# Patient Record
Sex: Female | Born: 1935
Health system: Southern US, Community
[De-identification: ages and names within clinical notes are randomized; demographics above are authoritative.]

## PROBLEM LIST (undated history)

## (undated) DIAGNOSIS — I219 Acute myocardial infarction, unspecified: Secondary | ICD-10-CM

## (undated) DIAGNOSIS — Z86718 Personal history of other venous thrombosis and embolism: Secondary | ICD-10-CM

## (undated) DIAGNOSIS — E785 Hyperlipidemia, unspecified: Secondary | ICD-10-CM

## (undated) DIAGNOSIS — I10 Essential (primary) hypertension: Secondary | ICD-10-CM

## (undated) DIAGNOSIS — I2510 Atherosclerotic heart disease of native coronary artery without angina pectoris: Secondary | ICD-10-CM

## (undated) DIAGNOSIS — I5181 Takotsubo syndrome: Secondary | ICD-10-CM

## (undated) HISTORY — PX: FRACTURE SURGERY: SHX138

## (undated) HISTORY — PX: TONSILLECTOMY: SUR1361

## (undated) HISTORY — DX: Personal history of other venous thrombosis and embolism: Z86.718

## (undated) HISTORY — PX: APPENDECTOMY: SHX54

## (undated) HISTORY — DX: Takotsubo syndrome: I51.81

## (undated) HISTORY — DX: Atherosclerotic heart disease of native coronary artery without angina pectoris: I25.10

---

## 1998-04-06 ENCOUNTER — Ambulatory Visit (HOSPITAL_COMMUNITY): Admission: RE | Admit: 1998-04-06 | Discharge: 1998-04-06 | Payer: Self-pay | Admitting: Obstetrics & Gynecology

## 1999-10-04 ENCOUNTER — Emergency Department (HOSPITAL_COMMUNITY): Admission: EM | Admit: 1999-10-04 | Discharge: 1999-10-04 | Payer: Self-pay | Admitting: Emergency Medicine

## 2005-06-10 DIAGNOSIS — I5181 Takotsubo syndrome: Secondary | ICD-10-CM

## 2005-06-10 DIAGNOSIS — I2510 Atherosclerotic heart disease of native coronary artery without angina pectoris: Secondary | ICD-10-CM

## 2005-06-10 DIAGNOSIS — I219 Acute myocardial infarction, unspecified: Secondary | ICD-10-CM

## 2005-06-10 HISTORY — DX: Atherosclerotic heart disease of native coronary artery without angina pectoris: I25.10

## 2005-06-10 HISTORY — DX: Takotsubo syndrome: I51.81

## 2005-06-10 HISTORY — DX: Acute myocardial infarction, unspecified: I21.9

## 2006-01-03 HISTORY — PX: CARDIAC CATHETERIZATION: SHX172

## 2006-01-05 ENCOUNTER — Inpatient Hospital Stay (HOSPITAL_COMMUNITY): Admission: EM | Admit: 2006-01-05 | Discharge: 2006-01-12 | Payer: Self-pay | Admitting: Emergency Medicine

## 2006-01-08 ENCOUNTER — Encounter (INDEPENDENT_AMBULATORY_CARE_PROVIDER_SITE_OTHER): Payer: Self-pay | Admitting: Cardiology

## 2006-01-30 ENCOUNTER — Encounter (HOSPITAL_COMMUNITY): Admission: RE | Admit: 2006-01-30 | Discharge: 2006-04-30 | Payer: Self-pay | Admitting: *Deleted

## 2007-06-11 HISTORY — PX: FIBULA FRACTURE SURGERY: SHX947

## 2008-06-01 ENCOUNTER — Inpatient Hospital Stay (HOSPITAL_COMMUNITY): Admission: EM | Admit: 2008-06-01 | Discharge: 2008-06-06 | Payer: Self-pay | Admitting: Emergency Medicine

## 2010-07-31 HISTORY — PX: TRANSTHORACIC ECHOCARDIOGRAM: SHX275

## 2010-10-23 NOTE — Op Note (Signed)
Rita Harding, Rita Harding             ACCOUNT NO.:  [id number]   MEDICAL RECORD NO.:  [id number]          PATIENT TYPE:  INP   LOCATION:  2550                         FACILITY:  [facility]   PHYSICIAN:  Almedia Balls. Ranell Patrick, M.D. DATE OF BIRTH:  [birth year]   DATE OF PROCEDURE:  DATE OF DISCHARGE:                               OPERATIVE REPORT   PREOPERATIVE DIAGNOSES:  Left ankle grade 2 open fracture dislocation.   POSTOPERATIVE DIAGNOSES:  Left ankle grade 2 open fracture dislocation.   PROCEDURE PERFORMED:  Incision and drainage of left open ankle fracture  dislocation with ankle open reduction and internal fixation, fibular  fracture.   ATTENDING SURGEON:  Almedia Balls. Ranell Patrick, MD   ASSISTANT:  Donnie Coffin. Dixon, PAC.   ANESTHESIA:  General anesthesia was used.   ESTIMATED BLOOD LOSS:  Minimal.   FLUID REPLACEMENT:  1100 mL crystalloid.   TOURNIQUET TIME:  30 minutes at 300 mmHg.   INDICATIONS:  The patient is a 77 year old female with status post  twisting injury to her left ankle.  The patient presented to the  emergency room with an obviously opened ankle fracture dislocation with  her tibial plafonds taking through her medial skin.  The patient had  about 5-7 cm laceration at the medial aspect of her ankle.  The patient  did have good pulses and sensation was intact.  She was irrigated and  close reduced in the Emergency Department.  Tetanus booster given as  well as antibiotics.  The patient presents now for definitive irrigation  and debridement as well as potential operative stabilization.  Informed  consent was obtained.   DESCRIPTION OF PROCEDURE:  After an adequate level of anesthesia was  achieved, the patient's ankle was thoroughly prepped and draped in the  usual manner with a nonsterile tourniquet on the proximal thigh.  We  first direct our attention towards the large medial open wound which was  transverse in nature, and performed thorough irrigation.  We identified  the saphenous vein in the anterior portion of the wound and the  posterior tibial tendon to the posterior aspect of the wound.  The  patient's ankle joint was exposed and was washed out thoroughly using a  pulsatile lavage system with 3 liters of normal saline irrigation.  Once  we had thoroughly washed out, we performed debridement of some of the  nonviable appearing soft tissue.  We went ahead and reduced the ankle  joint and fibular fracture lined up fairly well as did the posterior  malleolar fragment.  At this point, we went ahead and performed to  separate lateral incision over the distal fibula.  Dissection was  carried sharply down through the subcutaneous tissues and divided the  periosteum, identified the fracture site and placed in the anterior-  posterior lag screw, this was using an AO technique.  We then went ahead  and placed the posterior antiglide plate to prevent shortening in her  fracture, which is oblique, placing 2 screws in the proximal fragments.  We were so concerned about the entire ankle joint stability given the  deltoid was completely ruptured, and  it was basically tissue degloving  significant soft tissue laxity.  Immediately, we went ahead and we  wanted to make sure we had an adequate stabilization of the fibula.  We  performed a lateral plating as well with a neutralization plate using 2  screws in the distal fragment and 3 bicortical screws in the proximal  fragment.  We were happy with our fracture alignment, ankle mortise, and  also assessed the syndesmosis with a cotton technique and this was  deemed to be stable.  We then thoroughly irrigated and closed using a  subcutaneous closure with 2-0 Vicryl followed by staples.  On the medial  side of the ankle, we used interrupted nylon suture with a sterile  compressive bandage and a short leg splint.  The patient tolerated the  surgery well.      Almedia Balls. Ranell Patrick, M.D.  Electronically Signed      SRN/MEDQ  D:  06/01/2008  T:  06/01/2008  Job:  [id number]

## 2010-10-26 NOTE — Discharge Summary (Signed)
Rita Harding, Rita Harding             ACCOUNT NO.:  [id number]   MEDICAL RECORD NO.:  [id number]          PATIENT TYPE:  INP   LOCATION:  5033                         FACILITY:  [facility]   PHYSICIAN:  Almedia Balls. Ranell Patrick, M.D. DATE OF BIRTH:  10-14-1933   DATE OF ADMISSION:  06/01/2008  DATE OF DISCHARGE:  06/06/2008                               DISCHARGE SUMMARY   ADMITTING DIAGNOSES:  1. Left distal tibia and fibula fracture.  2. Grade 2 open left ankle fracture dislocation.   DISCHARGE DIAGNOSES:  1. Left distal tibia and fibula fracture.  2. Grade 2 open left ankle fracture dislocation.   PROCEDURE PERFORMED:  Irrigation and debridement as well as open  reduction and internal fixation of the left grade 2 ankle fracture  dislocation and that was performed on June 01, 2008.   CONSULTING SERVICES:  Physical therapy and occupational therapy.   HISTORY OF PRESENT ILLNESS:  The patient is a 77 year old female who  suffered a twisting injury in fall on June 01, 2008.  She presented  with an obvious grade 2 open ankle fracture dislocation.  The patient  was underwent urgent reduction in the emergency department and  subsequent urgent admission for definitive operative care and further  treatment for her open fracture dislocation.  For further details on the  patient's past medical history and physical examination, please see the  medical record.   HOSPITAL COURSE:  The patient was taken after reduction in the emergency  department urgently to the operating room for I and D and open reduction  and internal fixation of her ankle.  She tolerated that very well on  June 01, 2008, was placed on perioperative antibiotics which were  maintained for 42 hours for her open injury.  She was started immediate  dressing changes and a mobilization.  The patient was strict  nonweightbearing on her left ankle.  Physical therapy and occupational  therapy engaged for mobilization and  instruction on nonweightbearing and  transfer training.  The patient was arranged for discharged with home  health care by advanced.  This discharge arrangement was made for  June 06, 2008.  She was discharged in stable condition on Percocet  and Robaxin as well as previous medications.  The patient was also  instructed on the use of a TED hose for her noninvolved right leg,  instructed on elevation for control of edema and also for prevention of  blood clots.  She be following up with this in Orthopedics in 2 weeks.      Almedia Balls. Ranell Patrick, M.D.  Electronically Signed     SRN/MEDQ  D:  07/08/2008  T:  07/08/2008  Job:  [id number]

## 2010-10-26 NOTE — Discharge Summary (Signed)
Rita Harding, Rita Harding             ACCOUNT NO.:  [id number]   MEDICAL RECORD NO.:  [id number]          PATIENT TYPE:  INP   LOCATION:  2037                         FACILITY:  [facility]   PHYSICIAN:  Darcella Gasman. In[place], N.P.  DATE OF BIRTH:  1933-08-07   DATE OF ADMISSION:  01/05/2006  DATE OF DISCHARGE:  01/12/2006                                 DISCHARGE SUMMARY   DISCHARGE DIAGNOSES:  1.  ST elevation myocardial infarction secondary to spasm and mild bridging.  2.  Coronary artery disease nonobstructive with 50-60% stenosis only in the      left anterior descending again with spasm and mild bridging.  3.  Left ventricular dysfunction, ejection fraction 30-35% most likely      secondary to coronary spasm and with medical therapy hopefully will      improve over the next month.  4.  Right groin hematoma with aneurysm.      1.  Compression of pseudoaneurysm.  5.  Anticoagulation secondary to decreased ejection fraction.   DISCHARGE CONDITION:  Improved.   PROCEDURE:  January 05, 2006 combined left heart catheterization emergently by  Dr. Lenise Herald for ST elevation MI with results as previously stated.   DISCHARGE MEDICATIONS:  1.  Lipitor 80 mg daily.  2.  Coreg 3.125 twice a day.  3.  Coumadin 5 mg daily.  4.  Aldactone 25 mg daily.  5.  Aspirin 81 mg daily.  6.  Benazepril 5 mg daily.  7.  Iron 150 mg twice a day.  8.  Multivitamin daily.  9.  Calcium daily.  10. Stop hydrochlorothiazide.   DISCHARGE INSTRUCTIONS:  Low fat, low cholesterol, low sodium diet.  Increase activity slowly.  May shower and bathe.  May walk up steps 1-2  times a day.  No driving for 1 week.  No lifting for 2 weeks.  Call for  right groin redness, swelling or drainage.  Followup with Dr. Jenne Campus in 1-2  weeks.  The office will call with date and time.  Have blood work done  Monday, Tuesday of this week including INR and basic metabolic panel.   HISTORY OF PRESENT ILLNESS:  A 77 year old white female  with history of  borderline hypertension only taking hydrochlorothiazide with intermittent  shortness of breath and back pain since January 04, 2006 presented to the  emergency room January 05, 2006 after feeling short of breath.  She had been  short of breath off and on all day on the 29th but did not come to the  emergency room.  Continued with shortness of breath.   PRIOR HISTORY:  She does have a family history of coronary artery disease.  She was seen by Dr. Jenne Campus and had some ST elevation on her EKG and was  taken emergently to the catheterization lab for cardiac catheterization and  then admitted to [facility].  Outpatient medications included  hydrochlorothiazide.   ALLERGIES:  CODEINE CAUSES NAUSEA.   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  She quit smoking 30  years ago, 1-2 glasses of wine a day.   FAMILY HISTORY:  Father died at 91 with CABG and MI.  Mother died at 46 with  breast cancer.  One sister healthy.   REVIEW OF SYSTEMS:  See H and P.   PHYSICAL EXAMINATION:  Vital signs: Blood pressure 142/84, pulse 65,  respirations 20, afebrile.  Room air oxygen saturation 95%.  Lungs: Clear to auscultation bilaterally.  Heart: Regular rate and rhythm.  Abdomen: Soft, positive bowel sounds.  Extremities: Right groin stable.  She did have a hematoma perhaps 2 cm.   LABORATORY DATA:  Hemoglobin on admission 13.6, hematocrit 39.3, WBC 7.6,  platelets 182, neutrophils 82, lymphs 12, monos 5.  Hemoglobin dropped to a  low of 10.2 but was stable with hematocrit of 29.1.  Prothrombin time on  admission 13.2, INR of 1, PTT 24, D-dimer 0.4.  She was heparin with  therapeutic heparin levels.  She was started on Coumadin prior to discharge  and INR at discharge was 2.2.  Chemistry: Sodium 139 on admission, potassium  3.9, chloride 107, CO2 27, glucose 119, BUN 19, creatinine 0.9.  These  remained stable throughout hospitalization.  Glucose was 99 prior to   discharge.   Cardiac enzymes: CK 202 with an MB of 18.8 and CK of 157 with MB 11.7.  Troponin I was 5.03 to 2.76.  Cholesterol 209, triglycerides 230, HDL 53,  LDL 110.   Chest x-ray on admission mild cardiomegaly, mild changes of COPD and chronic  bronchitis.  Minimal linear atelectasis or scarring.   FOLLOWUP:  On the 1st of August with stable cardiomegaly, slight prominence  right hilar, no acute abnormalities.   Initial EKG sinus rhythm with atrial enlargement, ST elevations, and V3-4  with T wave inversions and V5-6 also inverted T waves in 2, 3 and AVF.   Followup on the 30th septal infarct, marked T wave abnormalities with  significantly deeper T waves in her lateral leads and on the 31st improved  ST depression.   The patient was admitted by Dr. Jenne Campus emergently, taken to the  catheterization lab, was found to have noncritical disease but most likely  coronary spasm as well as bridging.  Her EF was decreased and she was placed  on heparin to prevent thrombus and admitted to the CCU.  She developed the  right groin hematoma by the next day which had increased by the second day  post procedure and ultrasound revealed a pseudoaneurysm.  The patient  underwent compression of the pseudoaneurysm successfully and she continued  to improve thereafter, hospitalization was prolonged for heparin Coumadin  crossover in the face of right groin hematoma.  By January 11, 2006 it was  stable and she was ready for discharge home.  She did still continue with a  hard hematoma approximately 2-3 cm at  the insertion site and ecchymosis around that area.  She needed to watch  that closely, to take it easy from a physical activity aspect and would  followup with Dr. Jenne Campus as an outpatient.  She will need close observation  of her INR, repeat her echocardiogram in 1 month and will titrate her Coreg  in the outpatient setting as it allows.     Darcella Gasman. Rita Harding, N.P.     LRI/MEDQ  D:   01/12/2006  T:  01/12/2006  Job:  [id number]   cc:   Delman Cheadle, MD  Ladell Pier, M.D.

## 2010-10-26 NOTE — Cardiovascular Report (Signed)
NAME:  Rita Harding, Rita Harding                 ACCOUNT NO.:  0   MEDICAL RECORD NO.:  [id number]           PATIENT TYPE:   LOCATION:                                 FACILITY:   PHYSICIAN:  Darlin Priestly, MD  DATE OF BIRTH:  Nov 01, 1933   DATE OF PROCEDURE:  01/03/2006  DATE OF DISCHARGE:                              CARDIAC CATHETERIZATION   PROCEDURES:  1. Left heart catheterization.  2. Coronary angiography.  3. Left ventriculogram.  4. Abdominal aortogram.   ATTENDING SURGEON:  Darlin Priestly, MD   COMPLICATIONS:  None.   INDICATIONS:  [name] is a 77 year old female who has a past history of  borderline hypertension who presented to the ER on January 03, 2006, with 2  days of stuttering, shortness of breath, dyspnea on exertion and back pain.  The patient has no prior cardiac history.  In the ER, she was noted to have  anterior Q waves with mild ST elevation in V3 and V4.  She was also noticed  to have pronounced inferolateral ST and T wave changes.  Her initial  troponin was greater than 3 in the ER with elevated CK-MBs.  She is now  brought to the cardiac catheterization lab to define her current anatomy.   DESCRIPTION OF PROCEDURE:  After informed consent, the patient was brought  to the cardiac catheterization lab, right groin shaved, prepped and draped  in the usual fashion.  Using modified Seldinger technique, a 6 French  arterial sheath was inserted in the right femoral artery.  A 6 French  diagnostic catheter was used to perform angiography.   The left main is a large vessel with no severe disease.   The LAD is a medium to large vessel coursing to apex.  There is one diagonal  branch.  The LAD is a tortuous vessel.  There is diffuse 50-60% disease  throughout the entire mid segment with spasm and mild bridging of the LAD.  Intracoronary nitroglycerin does improve the spasm to approximately 50%.  There is TIMI-2-1/2 to 3 flow into the distal LAD.   First  diagonal was a small vessel with no disease.   Left coronary artery progresses to large ramus intermedius which bifurcates  in the mid segment with no significant disease.   The left circumflex is a medium-size vessel.  There is no obtuse marginal  branch.  The circumflex with no significant disease.   The OM-1 is a small to medium-size vessel which bifurcates in the mid  segment with no significant disease.   The right coronary artery is a medium-size vessel which is dominant and  gives rise to PDA with lower branch.  There is no significant disease in the  RCA, PDA or posterolateral branch.   Left ventriculogram reveals a moderate depressed EF at 30-35% with severe  anterior, anterolateral, apical and infra-apical akinesis.   Abdominal aortogram reveals a tortuous abdominal aorta with no evidence of  significant aortoiliac disease.   HEMODYNAMICS:  Left coronary artery pressure 118/75, LV pressure 119/11 and  LVDP of 18.   CONCLUSION:  1. Noncritical coronary artery disease involving the mid left anterior      descending segment with diffuse 50-60% disease with probable spasm and      mild bridging.  2. Moderately depressed left ventricular systolic function.  Wall motion      abnormality is noted above.  3. Tortuous abdominal aorta.      Darlin Priestly, MD  Electronically Signed     RHM/MEDQ  D:  01/05/2006  T:  01/06/2006  Job:  [id number]

## 2011-01-29 HISTORY — PX: DOPPLER LEFT LOWER EXTREMITY (ARMC HX): HXRAD1698

## 2011-01-29 HISTORY — PX: DOPPLER RIGHT ADDITIONAL EXTR (ARMC HX): HXRAD1699

## 2011-03-15 LAB — CBC
HCT: 36.9 % (ref 36.0–46.0)
MCHC: 33.3 g/dL (ref 30.0–36.0)
MCV: 96.1 fL (ref 78.0–100.0)
RDW: 13.4 % (ref 11.5–15.5)

## 2011-03-15 LAB — BASIC METABOLIC PANEL
BUN: 13 mg/dL (ref 6–23)
Calcium: 8.6 mg/dL (ref 8.4–10.5)
Chloride: 101 mEq/L (ref 96–112)
Creatinine, Ser: 0.62 mg/dL (ref 0.4–1.2)
GFR calc Af Amer: >60 mL/min (ref >60)
GFR calc Af Amer: >60 mL/min (ref >60)
GFR calc non Af Amer: >60 mL/min (ref >60)
Glucose, Bld: 130 mg/dL — ABNORMAL HIGH (ref 70–99)
Potassium: 3.9 mEq/L (ref 3.5–5.1)

## 2011-03-15 LAB — DIFFERENTIAL
Basophils Relative: 1 % (ref 0–1)
Eosinophils Absolute: 0.1 10*3/uL (ref 0.0–0.7)
Monocytes Relative: 7 % (ref 3–12)
Neutrophils Relative %: 73 % (ref 43–77)

## 2011-03-15 LAB — POCT I-STAT, CHEM 8
Chloride: 108 mEq/L (ref 96–112)
Glucose, Bld: 124 mg/dL — ABNORMAL HIGH (ref 70–99)
HCT: 38 % (ref 36.0–46.0)

## 2011-03-15 LAB — HEMOGLOBIN AND HEMATOCRIT, BLOOD: HCT: 32.6 % — ABNORMAL LOW (ref 36.0–46.0)

## 2012-02-21 HISTORY — PX: NM MYOVIEW LTD: HXRAD82

## 2012-11-09 ENCOUNTER — Telehealth: Payer: Self-pay | Admitting: Cardiovascular Disease

## 2012-11-09 MED ORDER — ATORVASTATIN CALCIUM 80 MG PO TABS: 80.0000 mg | ORAL_TABLET | Freq: Every day | ORAL | Status: DC

## 2012-11-09 NOTE — Telephone Encounter (Signed)
Rx was called in to pharmacy. 

## 2012-11-09 NOTE — Telephone Encounter (Signed)
Been without her medicine for 5 days! Prescripition was left over a week-she needs her medicine today-She need her Atorvastatin 80mg -please call to [phone]!e for

## 2013-01-03 ENCOUNTER — Encounter (HOSPITAL_BASED_OUTPATIENT_CLINIC_OR_DEPARTMENT_OTHER): Payer: Self-pay | Admitting: Emergency Medicine

## 2013-01-03 ENCOUNTER — Emergency Department (HOSPITAL_BASED_OUTPATIENT_CLINIC_OR_DEPARTMENT_OTHER): Payer: Medicare Other

## 2013-01-03 ENCOUNTER — Emergency Department (HOSPITAL_BASED_OUTPATIENT_CLINIC_OR_DEPARTMENT_OTHER)
Admission: EM | Admit: 2013-01-03 | Discharge: 2013-01-04 | Disposition: A | Discharge status: Home / Self Care | Payer: Medicare Other | Attending: Emergency Medicine | Admitting: Emergency Medicine

## 2013-01-03 DIAGNOSIS — D649 Anemia, unspecified: Secondary | ICD-10-CM

## 2013-01-03 DIAGNOSIS — Z79899 Other long term (current) drug therapy: Secondary | ICD-10-CM | POA: Insufficient documentation

## 2013-01-03 DIAGNOSIS — I252 Old myocardial infarction: Secondary | ICD-10-CM | POA: Insufficient documentation

## 2013-01-03 DIAGNOSIS — Y9289 Other specified places as the place of occurrence of the external cause: Secondary | ICD-10-CM | POA: Insufficient documentation

## 2013-01-03 DIAGNOSIS — E785 Hyperlipidemia, unspecified: Secondary | ICD-10-CM | POA: Insufficient documentation

## 2013-01-03 DIAGNOSIS — I10 Essential (primary) hypertension: Secondary | ICD-10-CM | POA: Insufficient documentation

## 2013-01-03 DIAGNOSIS — W010XXA Fall on same level from slipping, tripping and stumbling without subsequent striking against object, initial encounter: Secondary | ICD-10-CM | POA: Insufficient documentation

## 2013-01-03 DIAGNOSIS — S0180XA Unspecified open wound of other part of head, initial encounter: Secondary | ICD-10-CM | POA: Insufficient documentation

## 2013-01-03 DIAGNOSIS — Y93G9 Activity, other involving cooking and grilling: Secondary | ICD-10-CM | POA: Insufficient documentation

## 2013-01-03 DIAGNOSIS — IMO0002 Reserved for concepts with insufficient information to code with codable children: Secondary | ICD-10-CM

## 2013-01-03 DIAGNOSIS — I959 Hypotension, unspecified: Secondary | ICD-10-CM | POA: Insufficient documentation

## 2013-01-03 DIAGNOSIS — S0990XA Unspecified injury of head, initial encounter: Secondary | ICD-10-CM | POA: Insufficient documentation

## 2013-01-03 DIAGNOSIS — Z7902 Long term (current) use of antithrombotics/antiplatelets: Secondary | ICD-10-CM | POA: Insufficient documentation

## 2013-01-03 HISTORY — DX: Hyperlipidemia, unspecified: E78.5

## 2013-01-03 HISTORY — DX: Essential (primary) hypertension: I10

## 2013-01-03 HISTORY — DX: Acute myocardial infarction, unspecified: I21.9

## 2013-01-03 LAB — PROTIME-INR: Prothrombin Time: 13.2 seconds (ref 11.6–15.2)

## 2013-01-03 LAB — CBC WITH DIFFERENTIAL/PLATELET
Basophils Absolute: 0 10*3/uL (ref 0.0–0.1)
Basophils Relative: 1 % (ref 0–1)
Eosinophils Relative: 1 % (ref 0–5)
HCT: 34.1 % — ABNORMAL LOW (ref 36.0–46.0)
Hemoglobin: 11.4 g/dL — ABNORMAL LOW (ref 12.0–15.0)
Lymphocytes Relative: 23 % (ref 12–46)
MCH: 32.5 pg (ref 26.0–34.0)
MCHC: 33.4 g/dL (ref 30.0–36.0)
Monocytes Absolute: 0.4 10*3/uL (ref 0.1–1.0)
Monocytes Relative: 8 % (ref 3–12)
Neutrophils Relative %: 67 % (ref 43–77)
Platelets: 244 10*3/uL (ref 150–400)
RBC: 3.51 MIL/uL — ABNORMAL LOW (ref 3.87–5.11)
RDW: 12.7 % (ref 11.5–15.5)
WBC: 5.2 10*3/uL (ref 4.0–10.5)

## 2013-01-03 LAB — COMPREHENSIVE METABOLIC PANEL
ALT: 17 U/L (ref 0–35)
CO2: 26 mEq/L (ref 19–32)
Chloride: 103 mEq/L (ref 96–112)
GFR calc Af Amer: >90 mL/min (ref >90)
Potassium: 4.1 mEq/L (ref 3.5–5.1)
Total Protein: 6.7 g/dL (ref 6.0–8.3)

## 2013-01-03 LAB — HEMOGLOBIN AND HEMATOCRIT, BLOOD
HCT: 31.9 % — ABNORMAL LOW (ref 36.0–46.0)
Hemoglobin: 10.6 g/dL — ABNORMAL LOW (ref 12.0–15.0)

## 2013-01-03 MED ORDER — CEFAZOLIN SODIUM 1-5 GM-% IV SOLN
1.0000 g | Freq: Once | INTRAVENOUS | Status: AC
  Administered 2013-01-03: 1 g via INTRAVENOUS
  Filled 2013-01-03: qty 50

## 2013-01-03 MED ORDER — ACETAMINOPHEN 325 MG PO TABS
650.0000 mg | ORAL_TABLET | Freq: Once | ORAL | Status: AC
  Administered 2013-01-03: 650 mg via ORAL
  Filled 2013-01-03: qty 2

## 2013-01-03 MED ORDER — LIDOCAINE-EPINEPHRINE 2 %-1:100000 IJ SOLN
INTRAMUSCULAR | Status: AC
  Filled 2013-01-03: qty 1

## 2013-01-03 MED ORDER — CEPHALEXIN 500 MG PO CAPS: 500.0000 mg | ORAL_CAPSULE | Freq: Three times a day (TID) | ORAL | Status: DC

## 2013-01-03 NOTE — ED Provider Notes (Signed)
Medical screening examination/treatment/procedure(s) were performed by non-physician practitioner and as supervising physician I was immediately available for consultation/collaboration.   Thoren Hosang, MD 01/03/13 2341 

## 2013-01-03 NOTE — ED Notes (Addendum)
Pt sts fall while making dinner, fell and hit head on storm door and/or slate porch bottom. Pt sts happened approx 30 min ago. Pt is on blood  Thinner, but not Coumadin. Daughter is calling family to find name of meds.

## 2013-01-03 NOTE — ED Provider Notes (Signed)
CSN: [id number]     Arrival date & time 01/03/13  1934 History  This chart was scribed for Rolan Bucco, MD by Bennett Scrape, ED Scribe. This patient was seen in room MH12/MH12 and the patient's care was started at 7:40 PM.   First MD Initiated Contact with Patient 01/03/13 2006     Chief Complaint  Patient presents with  . Head Injury    Patient is a 79 y.o. female presenting with head injury. The history is provided by the patient. No language interpreter was used.  Head Injury Location:  Frontal Time since incident:  30 minutes Mechanism of injury: fall   Pain details:    Severity:  Mild   Timing:  Constant   Progression:  Unchanged Chronicity:  New Associated symptoms: headache   Associated symptoms: no nausea, no neck pain, no numbness and no vomiting     HPI Comments: Rita Harding is a 79 y.o. female who presents to the Emergency Department complaining of head laceration that occurred 30 minutes PTA. She reports that she was making dinner, and carrying in some food from the grill when and tripped and fell. The daughter thinks that she hit her head on some bricks. She denies any loss of consciousness.. She is unsure of the exact mechanism. She has several bleeding lacerations to her forehead and chin. She denies that the storm door broke on her head. She denies neck pain or LOC. She admits to drinking two glasses of wine tonight. She is currently on Plavix 75 mg once daily. She reports a h/o MI but denies any prior CHF diagnoses. She was holding a glass of wine and it broke when she fell but did not feel like she got any glass into her face.  Past Medical History  Diagnosis Date  . Hypertension   . Hyperlipidemia   . MI (myocardial infarction)    Past Surgical History  Procedure Laterality Date  . Appendectomy    . Tonsillectomy     History reviewed. No pertinent family history. History  Substance Use Topics  . Smoking status: Never Smoker   . Smokeless  tobacco: Not on file  . Alcohol Use: Yes     Comment: daughter sts pt has been drinking multiple glasses of wine today   No OB history provided.  \ Review of Systems  Constitutional: Negative for fever, chills, diaphoresis and fatigue.  HENT: Negative for congestion, rhinorrhea, sneezing and neck pain.   Eyes: Negative.   Respiratory: Negative for cough, chest tightness and shortness of breath.   Cardiovascular: Negative for chest pain and leg swelling.  Gastrointestinal: Negative for nausea, vomiting, abdominal pain, diarrhea and blood in stool.  Genitourinary: Negative for frequency, hematuria, flank pain and difficulty urinating.  Musculoskeletal: Negative for back pain and arthralgias.  Skin: Positive for wound. Negative for rash.  Neurological: Positive for headaches. Negative for dizziness, speech difficulty, weakness and numbness.    Allergies  Codeine and Tetanus toxoids  Home Medications   Current Outpatient Rx  Name  Route  Sig  Dispense  Refill  . carvedilol (COREG) 12.5 MG tablet   Oral   Take 12.5 mg by mouth 2 (two) times daily with a meal.         . clopidogrel (PLAVIX) 75 MG tablet   Oral   Take 75 mg by mouth daily.         Marland Kitchen atorvastatin (LIPITOR) 80 MG tablet   Oral   Take 1 tablet (80 mg  total) by mouth daily.   30 tablet   11   . cephALEXin (KEFLEX) 500 MG capsule   Oral   Take 1 capsule (500 mg total) by mouth 3 (three) times daily.   21 capsule   0    Triage Vitals: BP 84/64  Pulse 73  Resp 18  Ht 5\' 4"  (1.626 m)  Wt 135 lb (61.236 kg)  BMI 23.16 kg/m2  SpO2 95%  Physical Exam  Nursing note and vitals reviewed. Constitutional: She is oriented to person, place, and time. She appears well-developed and well-nourished. No distress.  HENT:  Head: Normocephalic.  2 cm laceration to the right forehead with active bleeding, 2 cm laceration to the mid forehead with oozing, 3 cm laceration to the right chin, no active bleeding   Cardiovascular: Normal rate.   Pulmonary/Chest: Effort normal. No respiratory distress.  Musculoskeletal: Normal range of motion.  Neurological: She is alert and oriented to person, place, and time.  Skin: Skin is warm and dry.  Psychiatric: She has a normal mood and affect. Her behavior is normal.    ED Course   Procedures (including critical care time)  DIAGNOSTIC STUDIES: Oxygen Saturation is 95% on room air, normal by my interpretation.    COORDINATION OF CARE: 7:41 PM- BP is 63/41. Nurse to start IV fluids.  LACERATION REPAIR PROCEDURE NOTE The patient's identification was confirmed and consent was obtained. This procedure was performed by Rolan Bucco, MD at 8:00 PM. Site: right forehead  Sterile procedures observed Anesthetic used (type and amt): 2% lidocaine with epi Suture type/size: 5-0 prolene Length:2 cm # of Sutures: 4 Technique: simple interrupted  Complexity: well-approximated Antibx ointment applied Tetanus UTD Pt with active, spurting blood from site.  Was injected with lidocaine with epinephrine. Clotting powder was placed in the wound as well. This caused complete hemostasis the wound. The wound was then irrigated with normal saline and closed. That she appears to be fairly superficial and does not penetrate the galea. No foreign body, wound well approximated, site covered with dry, sterile dressing.  Patient tolerated procedure well without complications. Instructions for care discussed verbally and patient provided with additional written instructions for homecare and f/u.  8:02 PM-BP improved to 84/64. 8:04 PM-Discussed treatment plan which includes CT of head and c-spine, CBC panel, CMP and APTT with pt at bedside and pt agreed to plan.   Results for orders placed during the hospital encounter of 01/03/13  CBC WITH DIFFERENTIAL      Result Value Range   WBC 5.2  4.0 - 10.5 K/uL   RBC 3.51 (*) 3.87 - 5.11 MIL/uL   Hemoglobin 11.4 (*) 12.0 - 15.0  g/dL   HCT 16.1 (*) 09.6 - 04.5 %   MCV 97.2  78.0 - 100.0 fL   MCH 32.5  26.0 - 34.0 pg   MCHC 33.4  30.0 - 36.0 g/dL   RDW 40.9  81.1 - 91.4 %   Platelets 244  150 - 400 K/uL   Neutrophils Relative % 67  43 - 77 %   Neutro Abs 3.5  1.7 - 7.7 K/uL   Lymphocytes Relative 23  12 - 46 %   Lymphs Abs 1.2  0.7 - 4.0 K/uL   Monocytes Relative 8  3 - 12 %   Monocytes Absolute 0.4  0.1 - 1.0 K/uL   Eosinophils Relative 1  0 - 5 %   Eosinophils Absolute 0.1  0.0 - 0.7 K/uL   Basophils Relative 1  0 - 1 %   Basophils Absolute 0.0  0.0 - 0.1 K/uL  COMPREHENSIVE METABOLIC PANEL      Result Value Range   Sodium 140  135 - 145 mEq/L   Potassium 4.1  3.5 - 5.1 mEq/L   Chloride 103  96 - 112 mEq/L   CO2 26  19 - 32 mEq/L   Glucose, Bld 112 (*) 70 - 99 mg/dL   BUN 20  6 - 23 mg/dL   Creatinine, Ser 1.61  0.50 - 1.10 mg/dL   Calcium 9.9  8.4 - 09.6 mg/dL   Total Protein 6.7  6.0 - 8.3 g/dL   Albumin 3.5  3.5 - 5.2 g/dL   AST 18  0 - 37 U/L   ALT 17  0 - 35 U/L   Alkaline Phosphatase 84  39 - 117 U/L   Total Bilirubin 0.2 (*) 0.3 - 1.2 mg/dL   GFR calc non Af Amer 81 (*) >90 mL/min   GFR calc Af Amer >90  >90 mL/min  PROTIME-INR      Result Value Range   Prothrombin Time 13.2  11.6 - 15.2 seconds   INR 1.02  0.00 - 1.49  APTT      Result Value Range   aPTT 25  24 - 37 seconds  ETHANOL      Result Value Range   Alcohol, Ethyl (B) 102 (*) 0 - 11 mg/dL  HEMOGLOBIN AND HEMATOCRIT, BLOOD      Result Value Range   Hemoglobin 10.6 (*) 12.0 - 15.0 g/dL   HCT 04.5 (*) 40.9 - 81.1 %       Ct Head Wo Contrast  01/03/2013   *RADIOLOGY REPORT*  Clinical Data:  Post fall, on blood thinners  CT HEAD WITHOUT CONTRAST CT CERVICAL SPINE WITHOUT CONTRAST  Technique:  Multidetector CT imaging of the head and cervical spine was performed following the standard protocol without intravenous contrast.  Multiplanar CT image reconstructions of the cervical spine were also generated.  Comparison:   None   CT HEAD  Findings:  Midline skin staples (image 24, series 2).  There is soft tissue swelling and scattered foci of subcutaneous emphysema about the right side of the forehead (image 22, series 2).  These findings are without associated displaced calvarial fracture, acute intracranial hemorrhage or radiopaque foreign body.  There is mild, likely age appropriate atrophy with sulcal prominence.  Scattered periventricular hypodensities suggestive of microvascular ischemic disease.  Gray white differentiation is otherwise well maintained.  No CT evidence of acute large territory infarct.  Normal size and configuration of the ventricles and basilar cisterns.  No midline shift.  Limited visualization of the paranasal sinuses and mastoid air cells are normal.  IMPRESSION: 1.  Midline skin staples with additional area of swelling and scattered foci of subcutaneous emphysema about the right side of the forehead.  These findings are without associated displaced calvarial fracture, acute intracranial hemorrhage or radiopaque foreign body.  2.  Mild atrophy and microvascular ischemic disease.  -------------------------------------------------------------  CT CERVICAL SPINE  Findings:  C1 to the inferior aspect of the T2 vertebral body is imaged.  There is a mild scoliotic curvature of the cervical spine, convex to the left, possibly positional.  No anterolisthesis or retrolisthesis.  The bilateral facets are normally aligned.  The dens is normally positioned between the lateral masses of C1. Degenerative change of the atlanto-odontoid articulation with several accessory ossicles/osteophytes adjacent to the tip of the dens.  Normal atlanto-axial  articulation.  No displaced fracture or static subluxation of the cervical spine. Prevertebral soft tissues are normal.  There is partial osseous fusion of the C3 - C4 as well as the C5 - C6 intervertebral disc spaces.  There is severe DDD of C4 - C5 as well as C6 - C7 with complete disc  space loss, end plate irregularity and posterior disc osteophyte complexes at these levels.  There is a bridging osseous fusion of the right-sided facets extending from C2-C4 as well as C7 - T1. There is osseous fusion of the left sided C3 - C4 transverse facets.  Vascular calcifications within the bilateral carotid bulb.  No bulky cervical lymphadenopathy.  There is mild heterogeneity of the thyroid gland without discrete nodule.  Limited visualization of the lung apices is normal.  IMPRESSION: 1.  No fracture or static subluxation of the cervical spine. 2.  Multilevel level degenerative osseous fusion and severe DDD as detailed above.   Original Report Authenticated By: Tacey Ruiz, MD   Ct Cervical Spine Wo Contrast  01/03/2013   *RADIOLOGY REPORT*  Clinical Data:  Post fall, on blood thinners  CT HEAD WITHOUT CONTRAST CT CERVICAL SPINE WITHOUT CONTRAST  Technique:  Multidetector CT imaging of the head and cervical spine was performed following the standard protocol without intravenous contrast.  Multiplanar CT image reconstructions of the cervical spine were also generated.  Comparison:   None  CT HEAD  Findings:  Midline skin staples (image 24, series 2).  There is soft tissue swelling and scattered foci of subcutaneous emphysema about the right side of the forehead (image 22, series 2).  These findings are without associated displaced calvarial fracture, acute intracranial hemorrhage or radiopaque foreign body.  There is mild, likely age appropriate atrophy with sulcal prominence.  Scattered periventricular hypodensities suggestive of microvascular ischemic disease.  Gray white differentiation is otherwise well maintained.  No CT evidence of acute large territory infarct.  Normal size and configuration of the ventricles and basilar cisterns.  No midline shift.  Limited visualization of the paranasal sinuses and mastoid air cells are normal.  IMPRESSION: 1.  Midline skin staples with additional area of  swelling and scattered foci of subcutaneous emphysema about the right side of the forehead.  These findings are without associated displaced calvarial fracture, acute intracranial hemorrhage or radiopaque foreign body.  2.  Mild atrophy and microvascular ischemic disease.  -------------------------------------------------------------  CT CERVICAL SPINE  Findings:  C1 to the inferior aspect of the T2 vertebral body is imaged.  There is a mild scoliotic curvature of the cervical spine, convex to the left, possibly positional.  No anterolisthesis or retrolisthesis.  The bilateral facets are normally aligned.  The dens is normally positioned between the lateral masses of C1. Degenerative change of the atlanto-odontoid articulation with several accessory ossicles/osteophytes adjacent to the tip of the dens.  Normal atlanto-axial articulation.  No displaced fracture or static subluxation of the cervical spine. Prevertebral soft tissues are normal.  There is partial osseous fusion of the C3 - C4 as well as the C5 - C6 intervertebral disc spaces.  There is severe DDD of C4 - C5 as well as C6 - C7 with complete disc space loss, end plate irregularity and posterior disc osteophyte complexes at these levels.  There is a bridging osseous fusion of the right-sided facets extending from C2-C4 as well as C7 - T1. There is osseous fusion of the left sided C3 - C4 transverse facets.  Vascular calcifications within the  bilateral carotid bulb.  No bulky cervical lymphadenopathy.  There is mild heterogeneity of the thyroid gland without discrete nodule.  Limited visualization of the lung apices is normal.  IMPRESSION: 1.  No fracture or static subluxation of the cervical spine. 2.  Multilevel level degenerative osseous fusion and severe DDD as detailed above.   Original Report Authenticated By: Tacey Ruiz, MD   1. Laceration   2. Anemia   3. Hypotension     MDM  Pt with 3 significant lacs.  The actively bleeding wound was  repaired by me.  The other 2 wounds were repaired by Fayrene Helper.  Pt was given IVFs.  She did become hypotensive at one point during the laceration repair. She was placed in a supine position and was given IV fluids. She responded well to the IV fluids. I did check a second hemoglobin with has dropped but is still stable. She was able to ambulate without difficulty. She's not feeling dizzy or lightheaded. I advised her to have a recheck with her primary care doctor in 2 days or return here if her symptoms worsen. I advised her the stitches need to be removed in 7 days.  Medical screening examination/treatment/procedure(s) were performed by non-physician practitioner and as supervising physician I was immediately available for consultation/collaboration.    Rolan Bucco, MD 01/03/13 2337

## 2013-01-03 NOTE — ED Provider Notes (Signed)
i performed laceration repair per Dr. Christoper Fabian request:  LACERATION REPAIR Performed by: Fayrene Helper Authorized by: Fayrene Helper Consent: Verbal consent obtained. Risks and benefits: risks, benefits and alternatives were discussed Consent given by: patient Patient identity confirmed: provided demographic data Prepped and Draped in normal sterile fashion Wound explored  Laceration Location: mid forehead near scalp  Laceration Length: 3cm  No Foreign Bodies seen or palpated  Anesthesia: local infiltration  Local anesthetic: lidocaine 2% w epinephrine  Anesthetic total: 3 ml  Irrigation method: syringe Amount of cleaning: standard  Skin closure: prolene 6.0 and surgical staples  Number of sutures: 5.  Number of staples: 2  Technique: simple interrupted, surgical staples  Patient tolerance: Patient tolerated the procedure well with no immediate complications.  LACERATION REPAIR Performed by: Fayrene Helper Authorized byFayrene Helper Consent: Verbal consent obtained. Risks and benefits: risks, benefits and alternatives were discussed Consent given by: patient Patient identity confirmed: provided demographic data Prepped and Draped in normal sterile fashion Wound explored  Laceration Location: Right side of chin  Laceration Length: 3cm  No Foreign Bodies seen or palpated  Anesthesia: local infiltration  Local anesthetic: lidocaine 2% w epinephrine  Anesthetic total: 3 ml  Irrigation method: syringe Amount of cleaning: standard  Skin closure: prolene 5.0  Number of sutures: 7  Technique: simple interrupted  Patient tolerance: Patient tolerated the procedure well with no immediate complications.    Fayrene Helper, PA-C 01/03/13 2016

## 2013-01-03 NOTE — ED Notes (Addendum)
Laceration @ 4cm Mid  To right  Frontal head at hairline and laceration 3cm right chin area both areas bleeding controlled with pressure and dry drsg. Pt denies LOC and able to recall events leading up to her falling while carrying tray of kabobi's to the kitchen tripping and falling strike head and face against glass door and slate step leading from patio into the home.

## 2013-03-16 ENCOUNTER — Telehealth: Payer: Self-pay | Admitting: Cardiovascular Disease

## 2013-03-16 NOTE — Telephone Encounter (Signed)
Called pt. ,no answer LMOM

## 2013-03-16 NOTE — Telephone Encounter (Signed)
Pt wants to speak to you about who she should see since Dr. Alanda Amass left. Pt was upset about a refill of her medications.

## 2013-04-13 ENCOUNTER — Emergency Department (HOSPITAL_COMMUNITY)
Admission: EM | Admit: 2013-04-13 | Discharge: 2013-04-13 | Disposition: A | Discharge status: Home / Self Care | Payer: Medicare Other | Attending: Emergency Medicine | Admitting: Emergency Medicine

## 2013-04-13 ENCOUNTER — Encounter (HOSPITAL_COMMUNITY): Payer: Self-pay | Admitting: Emergency Medicine

## 2013-04-13 DIAGNOSIS — I10 Essential (primary) hypertension: Secondary | ICD-10-CM | POA: Insufficient documentation

## 2013-04-13 DIAGNOSIS — IMO0002 Reserved for concepts with insufficient information to code with codable children: Secondary | ICD-10-CM

## 2013-04-13 DIAGNOSIS — Z79899 Other long term (current) drug therapy: Secondary | ICD-10-CM | POA: Insufficient documentation

## 2013-04-13 DIAGNOSIS — N8111 Cystocele, midline: Secondary | ICD-10-CM | POA: Insufficient documentation

## 2013-04-13 DIAGNOSIS — Z7902 Long term (current) use of antithrombotics/antiplatelets: Secondary | ICD-10-CM | POA: Insufficient documentation

## 2013-04-13 DIAGNOSIS — I252 Old myocardial infarction: Secondary | ICD-10-CM | POA: Insufficient documentation

## 2013-04-13 DIAGNOSIS — N8110 Cystocele, unspecified: Secondary | ICD-10-CM

## 2013-04-13 DIAGNOSIS — E785 Hyperlipidemia, unspecified: Secondary | ICD-10-CM | POA: Insufficient documentation

## 2013-04-13 DIAGNOSIS — N939 Abnormal uterine and vaginal bleeding, unspecified: Secondary | ICD-10-CM

## 2013-04-13 DIAGNOSIS — N898 Other specified noninflammatory disorders of vagina: Secondary | ICD-10-CM | POA: Insufficient documentation

## 2013-04-13 LAB — URINALYSIS, ROUTINE W REFLEX MICROSCOPIC
Glucose, UA: NEGATIVE mg/dL
Hgb urine dipstick: NEGATIVE
Leukocytes, UA: NEGATIVE
Specific Gravity, Urine: 1.011 (ref 1.005–1.030)
pH: 6 (ref 5.0–8.0)

## 2013-04-13 LAB — BASIC METABOLIC PANEL
CO2: 23 mEq/L (ref 19–32)
Chloride: 102 mEq/L (ref 96–112)
Glucose, Bld: 107 mg/dL — ABNORMAL HIGH (ref 70–99)
Potassium: 3.7 mEq/L (ref 3.5–5.1)
Sodium: 139 mEq/L (ref 135–145)

## 2013-04-13 LAB — CBC
Hemoglobin: 12.6 g/dL (ref 12.0–15.0)
MCH: 30.4 pg (ref 26.0–34.0)
RBC: 4.15 MIL/uL (ref 3.87–5.11)
WBC: 4.4 10*3/uL (ref 4.0–10.5)

## 2013-04-13 LAB — OCCULT BLOOD, POC DEVICE: Fecal Occult Bld: NEGATIVE

## 2013-04-13 NOTE — ED Notes (Signed)
Pt states this evening she had a bowel movement and it had some bright red blood noted with the stool   Pt denies pain

## 2013-04-13 NOTE — ED Provider Notes (Signed)
Medical screening examination/treatment/procedure(s) were performed by non-physician practitioner and as supervising physician I was immediately available for consultation/collaboration.  EKG Interpretation   None         Lyanne Co, MD 04/13/13 2334

## 2013-04-13 NOTE — ED Notes (Addendum)
Pt reports urinating and then seeing blood on the paper.

## 2013-04-13 NOTE — ED Notes (Signed)
Pt observed ambulating into room.

## 2013-04-13 NOTE — ED Provider Notes (Signed)
CSN: [id number]     Arrival date & time 04/13/13  2138 History   First MD Initiated Contact with Patient 04/13/13 2221     Chief Complaint  Patient presents with  . Rectal Bleeding   (Consider location/radiation/quality/duration/timing/severity/associated sxs/prior Treatment) The history is provided by the patient and medical records. No language interpreter was used.   Rita Harding is a 79 y.o. female  with a hx of hypertension, hyperlipidemia, MI presents to the Emergency Department complaining of one episode of blood on the toilet tissue lower prior to arrival. Patient reports she urinated and wiped and saw blood. She denies having a bowel movement or bright red blood in her stool but this is with the nursing note says.  She denies vaginal pain, vaginal discharge, dysuria, hematuria, abdominal pain, nausea, vomiting, diarrhea, weakness, dizziness, syncope.  Patient reports her last menstrual cycle was when she was age 59 and has not had a hysterectomy.  Past Medical History  Diagnosis Date  . Hypertension   . Hyperlipidemia   . MI (myocardial infarction)    Past Surgical History  Procedure Laterality Date  . Appendectomy    . Tonsillectomy     Family History  Problem Relation Age of Onset  . Cancer Mother   . Heart attack Father    History  Substance Use Topics  . Smoking status: Never Smoker   . Smokeless tobacco: Not on file  . Alcohol Use: Yes     Comment: daughter sts pt has been drinking multiple glasses of wine today   OB History   Grav Para Term Preterm Abortions TAB SAB Ect Mult Living                 Review of Systems  Constitutional: Negative for fever, diaphoresis, appetite change, fatigue and unexpected weight change.  HENT: Negative for mouth sores.   Eyes: Negative for visual disturbance.  Respiratory: Negative for cough, chest tightness, shortness of breath and wheezing.   Cardiovascular: Negative for chest pain.  Gastrointestinal: Positive for  hematochezia. Negative for nausea, vomiting, abdominal pain, diarrhea, constipation and blood in stool.  Endocrine: Negative for polydipsia, polyphagia and polyuria.  Genitourinary: Positive for vaginal bleeding. Negative for dysuria, urgency, frequency and hematuria.  Musculoskeletal: Negative for back pain and neck stiffness.  Skin: Negative for rash.  Allergic/Immunologic: Negative for immunocompromised state.  Neurological: Negative for syncope, light-headedness and headaches.  Hematological: Does not bruise/bleed easily.  Psychiatric/Behavioral: Negative for sleep disturbance. The patient is not nervous/anxious.     Allergies  Codeine and Tetanus toxoids  Home Medications   Current Outpatient Rx  Name  Route  Sig  Dispense  Refill  . atorvastatin (LIPITOR) 80 MG tablet   Oral   Take 1 tablet (80 mg total) by mouth daily.   30 tablet   11   . carvedilol (COREG) 12.5 MG tablet   Oral   Take 12.5 mg by mouth 2 (two) times daily with a meal.         . clopidogrel (PLAVIX) 75 MG tablet   Oral   Take 75 mg by mouth daily.          BP 108/64  Pulse 81  Temp(Src) 98.4 F (36.9 C) (Oral)  Resp 20  SpO2 94% Physical Exam  Nursing note and vitals reviewed. Constitutional: She is oriented to person, place, and time. She appears well-developed and well-nourished. No distress.  Awake, alert, nontoxic appearance  HENT:  Head: Normocephalic and atraumatic.  Mouth/Throat:  Oropharynx is clear and moist. No oropharyngeal exudate.  No pallor of the mucus membranes  Eyes: Conjunctivae are normal. No scleral icterus.  Neck: Normal range of motion. Neck supple.  Cardiovascular: Normal rate, regular rhythm, normal heart sounds and intact distal pulses.   No murmur heard. Pulmonary/Chest: Effort normal and breath sounds normal. No respiratory distress. She has no wheezes.  Abdominal: Soft. Normal appearance and bowel sounds are normal. She exhibits no distension and no mass. There  is no hepatosplenomegaly. There is no tenderness. There is no rigidity, no rebound, no guarding and no CVA tenderness. Hernia confirmed negative in the right inguinal area and confirmed negative in the left inguinal area.  Genitourinary: Rectal exam shows no external hemorrhoid, no internal hemorrhoid, no fissure, no mass, no tenderness and anal tone normal. Guaiac negative stool. Pelvic exam was performed with patient supine. There is no rash, tenderness, lesion or injury on the right labia. There is no rash, tenderness, lesion or injury on the left labia. There is bleeding around the vagina. No erythema or tenderness around the vagina. No foreign body around the vagina. No signs of injury around the vagina. No vaginal discharge found.  No blood in the vaginal vault Ulcerated lesion of the left vaginal wall  Musculoskeletal: Normal range of motion. She exhibits no edema.  Lymphadenopathy:    She has no cervical adenopathy.       Right: No inguinal adenopathy present.       Left: No inguinal adenopathy present.  Neurological: She is alert and oriented to person, place, and time. She exhibits normal muscle tone. Coordination normal.  Speech is clear and goal oriented Moves extremities without ataxia  Skin: Skin is warm and dry. No rash noted. She is not diaphoretic. No erythema. No pallor.  Psychiatric: She has a normal mood and affect. Her behavior is normal.    ED Course  Procedures (including critical care time) Labs Review Labs Reviewed  BASIC METABOLIC PANEL - Abnormal; Notable for the following:    Glucose, Bld 107 (*)    GFR calc non Af Amer 84 (*)    All other components within normal limits  CBC  URINALYSIS, ROUTINE W REFLEX MICROSCOPIC  OCCULT BLOOD, POC DEVICE   Imaging Review No results found.  EKG Interpretation   None       MDM   1. Prolapse of vaginal wall   2. Vaginal lesion   3. Vaginal bleeding   4. Cystocele      Rita Harding presents with a small  amount of blood on the toilet tissue this evening x1 episode after urination; no further blood seen.  Guaiac negative.  UA pending.  11:30 PM UA without blood or evidence of urinary tract infection. CBC without anemia and BMP unremarkable. Pelvic exam shows lesion of the left vaginal wall, ulcerated in nature; likely the source of the bleeding. Patient alert, oriented, nontoxic, nonseptic appearing, hemodynamically stable, non-tachycardic. No concern for symptomatic anemia. Discussed the concern for vaginal cancer with the patient. She states understanding reports that she will see her GYN this week.  It has been determined that no acute conditions requiring further emergency intervention are present at this time. The patient/guardian have been advised of the diagnosis and plan. We have discussed signs and symptoms that warrant return to the ED, such as changes or worsening in symptoms.   Vital signs are stable at discharge.   BP 108/64  Pulse 81  Temp(Src) 98.4 F (36.9 C) (Oral)  Resp 20  SpO2 94%  Patient/guardian has voiced understanding and agreed to follow-up with the PCP or specialist.        Dierdre Forth, PA-C 04/13/13 [phone]

## 2013-06-07 ENCOUNTER — Telehealth: Payer: Self-pay | Admitting: *Deleted

## 2013-06-07 NOTE — Telephone Encounter (Signed)
Pt was calling in regards to a physician has some questions. She asked to speak to you personally,

## 2013-09-07 ENCOUNTER — Ambulatory Visit: Payer: Medicare Other | Admitting: Cardiology

## 2013-09-16 ENCOUNTER — Encounter: Payer: Self-pay | Admitting: *Deleted

## 2013-09-20 ENCOUNTER — Ambulatory Visit (INDEPENDENT_AMBULATORY_CARE_PROVIDER_SITE_OTHER): Payer: Medicare Other | Admitting: Cardiology

## 2013-09-20 ENCOUNTER — Encounter: Payer: Self-pay | Admitting: Cardiology

## 2013-09-20 VITALS — BP 134/76 | HR 69 | Ht 63.0 in | Wt 137.2 lb

## 2013-09-20 DIAGNOSIS — E785 Hyperlipidemia, unspecified: Secondary | ICD-10-CM

## 2013-09-20 DIAGNOSIS — I5181 Takotsubo syndrome: Secondary | ICD-10-CM

## 2013-09-20 DIAGNOSIS — I2510 Atherosclerotic heart disease of native coronary artery without angina pectoris: Secondary | ICD-10-CM

## 2013-09-20 DIAGNOSIS — I10 Essential (primary) hypertension: Secondary | ICD-10-CM

## 2013-09-20 NOTE — Patient Instructions (Addendum)
Ask Dr Wylene Simmer if cholesterol MEDICATION is needed . Do not restart CLODIPOGREL (PLAVIX), SPIROLACTONE (ALDATONE), LIPITOR (ATORVASTATIN)   Your physician wants you to follow-up in 12 months Dr Herbie Baltimore along with husband Zollie Beckers). .You will receive a reminder letter in the mail two months in advance. If you don't receive a letter, please call our office to schedule the follow-up appointment.

## 2013-09-23 ENCOUNTER — Encounter: Payer: Self-pay | Admitting: Cardiology

## 2013-09-23 DIAGNOSIS — I2510 Atherosclerotic heart disease of native coronary artery without angina pectoris: Secondary | ICD-10-CM | POA: Insufficient documentation

## 2013-09-23 DIAGNOSIS — I10 Essential (primary) hypertension: Secondary | ICD-10-CM | POA: Insufficient documentation

## 2013-09-23 DIAGNOSIS — I5181 Takotsubo syndrome: Secondary | ICD-10-CM | POA: Insufficient documentation

## 2013-09-23 DIAGNOSIS — E785 Hyperlipidemia, unspecified: Secondary | ICD-10-CM | POA: Insufficient documentation

## 2013-09-23 NOTE — Assessment & Plan Note (Signed)
56%, moderate disease in the LAD noted 8 years ago. She has stopped her Plavix I think is fine to do so. These remains on aspirin and beta blocker. I will defer to her PCP about her lipids as I am not sure what her last labs were.

## 2013-09-23 NOTE — Assessment & Plan Note (Signed)
This is to be resolved with normal EF by most recent echocardiogram. No active heart failure symptoms. She is on carvedilol and aspirin. I think is fine for her to stop the spironolactone and she is not having heart symptoms.

## 2013-09-23 NOTE — Progress Notes (Signed)
PATIENT: Rita Harding MRN: [id number] DOB: [birth year] PCP: Pcp Not In System  Clinic Note: Chief Complaint  Patient presents with  . Annual Exam    no chest pian ,no sob, no edema    HPI: Rita Harding is a 80 y.o. female with a PMH below who presents today for yearly cardiology followup and establishment of a cardiologist at the retirement of Dr. Alanda AmassWeintraub. She has a history of what was felt to be nonischemic cardiomyopathy, likely takotsubo back in 2007. At that time patient Dr. Jenne CampusMcQueen, who performed a cath showing 50-60% LAD lesion with some myocardial bridging. She's had a negative Myoview in September 2013. EF was back to normal at 70%. There was question of mitral prolapse that was discounted by echo in February 2012. She had an echo ordered during her last visit, that was not performed.  Interval History: She presents today relatively asymptomatic. No major problems to suggest heart failure such as PND, orthopnea but does have mild edema. She denies any chest tightness or pressure with rest or exertion. No exertional dyspnea.  The remainder of cardiac review of systems is as follows:  No palpitations, lightheadedness, dizziness, weakness or syncope/near syncope. No TIA/amaurosis fugax symptoms. No melena, hematochezia,  Hematuria, or epistaxis. No claudication  Past Medical History  Diagnosis Date  . Hypertension   . Hyperlipidemia   . MI (myocardial infarction) 2007    Thought to be takotsubo cardiomyopathy  . Coronary artery disease 2007    non-critcal coronary single disease  . Hx of fracture of fibula 2009    LEFT ,UNDER CARE DR Beverely LowSTEVE NORRIS  . History of DVT (deep vein thrombosis)     BEEN OFF  COUMADIN SINCE Sept 2012  . Takotsubo cardiomyopathy 2007    Result with normal EF as OF 2013   Her Prior Cardiac Evaluation and Past Surgical History: Past Surgical History  Procedure Laterality Date  . Appendectomy    . Tonsillectomy    . Nm myoview ltd   02/21/2012    low risk  and normal EF 70% or greater  . Transthoracic echocardiogram  07/31/2010    no MVP ,NORMAL SYSTOLIC FUNCTION   . Cardiac catheterization  01/03/2006     50-60% LAD with some myocardial  bridging; NON ISCHEMIC  CARDIOMYOPATHY  with EF  30 to 35% no evidence of infarct -- anteroapical and inferapical wall motin abnormalities compatilble with Takotsubo-type syndrome  . Lower extremities venous doppler  01/29/2011    left saphenous vein -small amt of chroinc appearing calicification on anterior wall that was nonobstructing,mildly abnormal     Allergies  Allergen Reactions  . Codeine Nausea And Vomiting  . Tetanus Toxoids Other (See Comments)    Swollen and red ( patient informed that she had to take it in three doses)    Current Outpatient Prescriptions  Medication Sig Dispense Refill  . aspirin EC 81 MG tablet Take 81 mg by mouth daily.      . carvedilol (COREG) 12.5 MG tablet Take 12.5 mg by mouth 2 (two) times daily with a meal.      . cholecalciferol (VITAMIN D) 1000 UNITS tablet Take 1,000 Units by mouth daily.      Marland Kitchen. atorvastatin (LIPITOR) 80 MG tablet Take 1 tablet (80 mg total) by mouth daily.  30 tablet  11  . clopidogrel (PLAVIX) 75 MG tablet Take 75 mg by mouth daily.      Marland Kitchen. spironolactone (ALDACTONE) 25 MG tablet Take 25 mg  by mouth daily.       No current facility-administered medications for this visit.   she apparently has not been taking the Plavix, spironolactone or Lipitor.  History   Social History Narrative   Married mother of 3 with 5 grandchildren. Quit smoking over 30 years ago.   Goes with her husband, who is a former major Careers information officer, and also in a patient of mine with progression of dementia. 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   She continues to be active, but is no longer walking as frequent as she used to. She still does work around the yard and house.   Family History : Cancer in her  maternal grandmother and mother; Heart attack in her father; Heart disease in her paternal grandfather and paternal grandmother.  ROS: A comprehensive Review of Systems - Negative except Mild arthralgias. Some stress over care for her husband.  PHYSICAL EXAM BP 134/76  Pulse 69  Ht 5\' 3"  (1.6 m)  Wt 137 lb 3.2 oz (62.234 kg)  BMI 24.31 kg/m2 General appearance: alert, cooperative, appears stated age, no distress and Well-nourished and well-groomed. Normal mood pleasant affect. HEENT: [place]/AT, EOMI, MMM, anicteric sclera Neck: no adenopathy, no carotid bruit, no JVD, supple, symmetrical, trachea midline and She has mild neck stiffness Lungs: clear to auscultation bilaterally, normal percussion bilaterally and Nonlabored, good air movement Heart: RRR, S1, S2 normal, no click, rub or gallop, normal apical impulse and  intermittent split S2, soft 2/6 SEM at LUSB Abdomen: soft, non-tender; bowel sounds normal; no masses,  no organomegaly Extremities: extremities normal, atraumatic, no cyanosis or edema Pulses: 2+ and symmetric Neurologic: Alert and oriented X 3, normal strength and tone. Normal symmetric reflexes. Normal coordination and gait   Adult ECG Report  Rate: 69 ;  Rhythm: normal sinus rhythm; normal axis and normal intervals. QS in V1-V2  Narrative Interpretation: Stable EKG  Recent Labs: None available  ASSESSMENT / PLAN: Relatively stable elderly woman with no active cardiac symptoms. She has moderate CAD and resolved with takotsubo cardiomyopathy.  Takotsubo cardiomyopathy This is to be resolved with normal EF by most recent echocardiogram. No active heart failure symptoms. She is on carvedilol and aspirin. I think is fine for her to stop the spironolactone and she is not having heart symptoms.  Coronary artery disease 56%, moderate disease in the LAD noted 8 years ago. She has stopped her Plavix I think is fine to do so. These remains on aspirin and beta blocker. I will defer  to her PCP about her lipids as I am not sure what her last labs were.  Hyperlipidemia Not short her most recent labs were. I will defer treatment to Dr. Wylene Simmer. My impression would be that she will probably restart the statin, but I will defer the dosage and whether or not to restart to Dr. Wylene Simmer who has her labs.   Followup: 12 months  DAVID W. Herbie Baltimore, M.D., M.S. Interventional Cardiolgy [facility]

## 2013-09-23 NOTE — Assessment & Plan Note (Signed)
Not short her most recent labs were. I will defer treatment to Dr. Wylene Simmer. My impression would be that she will probably restart the statin, but I will defer the dosage and whether or not to restart to Dr. Wylene Simmer who has her labs.

## 2013-12-17 ENCOUNTER — Telehealth: Payer: Self-pay | Admitting: Cardiology

## 2013-12-17 NOTE — Telephone Encounter (Signed)
Left message to call back  

## 2013-12-17 NOTE — Telephone Encounter (Signed)
Please call,just picked her medicine at the pharmacy and they gave her a print out of her medicine. She need to go over this with you.

## 2013-12-30 NOTE — Telephone Encounter (Signed)
Spoke to patient. She states she went out of town unexpectedly. RN asked if the information (current medication list) could be mailed to her for her conveince. Patient states she can pick up the information this afternoon -concerning her and her husband  RN left information in envelope at front desk.

## 2014-04-29 ENCOUNTER — Emergency Department (HOSPITAL_COMMUNITY)
Admission: EM | Admit: 2014-04-29 | Discharge: 2014-04-29 | Disposition: A | Discharge status: Home / Self Care | Payer: Medicare Other | Attending: Emergency Medicine | Admitting: Emergency Medicine

## 2014-04-29 ENCOUNTER — Emergency Department (HOSPITAL_COMMUNITY): Payer: Medicare Other

## 2014-04-29 ENCOUNTER — Encounter (HOSPITAL_COMMUNITY): Payer: Self-pay | Admitting: *Deleted

## 2014-04-29 DIAGNOSIS — I252 Old myocardial infarction: Secondary | ICD-10-CM | POA: Insufficient documentation

## 2014-04-29 DIAGNOSIS — Z7982 Long term (current) use of aspirin: Secondary | ICD-10-CM | POA: Insufficient documentation

## 2014-04-29 DIAGNOSIS — Z9889 Other specified postprocedural states: Secondary | ICD-10-CM | POA: Insufficient documentation

## 2014-04-29 DIAGNOSIS — E785 Hyperlipidemia, unspecified: Secondary | ICD-10-CM | POA: Diagnosis not present

## 2014-04-29 DIAGNOSIS — Z8781 Personal history of (healed) traumatic fracture: Secondary | ICD-10-CM | POA: Diagnosis not present

## 2014-04-29 DIAGNOSIS — Y9389 Activity, other specified: Secondary | ICD-10-CM | POA: Insufficient documentation

## 2014-04-29 DIAGNOSIS — S0990XA Unspecified injury of head, initial encounter: Secondary | ICD-10-CM | POA: Diagnosis present

## 2014-04-29 DIAGNOSIS — Z79899 Other long term (current) drug therapy: Secondary | ICD-10-CM | POA: Diagnosis not present

## 2014-04-29 DIAGNOSIS — Z86718 Personal history of other venous thrombosis and embolism: Secondary | ICD-10-CM | POA: Diagnosis not present

## 2014-04-29 DIAGNOSIS — I10 Essential (primary) hypertension: Secondary | ICD-10-CM | POA: Diagnosis not present

## 2014-04-29 DIAGNOSIS — T148XXA Other injury of unspecified body region, initial encounter: Secondary | ICD-10-CM

## 2014-04-29 DIAGNOSIS — I2510 Atherosclerotic heart disease of native coronary artery without angina pectoris: Secondary | ICD-10-CM | POA: Diagnosis not present

## 2014-04-29 DIAGNOSIS — Y929 Unspecified place or not applicable: Secondary | ICD-10-CM | POA: Insufficient documentation

## 2014-04-29 DIAGNOSIS — Y998 Other external cause status: Secondary | ICD-10-CM | POA: Diagnosis not present

## 2014-04-29 DIAGNOSIS — S80212A Abrasion, left knee, initial encounter: Secondary | ICD-10-CM | POA: Diagnosis not present

## 2014-04-29 DIAGNOSIS — W19XXXA Unspecified fall, initial encounter: Secondary | ICD-10-CM

## 2014-04-29 DIAGNOSIS — W01198A Fall on same level from slipping, tripping and stumbling with subsequent striking against other object, initial encounter: Secondary | ICD-10-CM | POA: Insufficient documentation

## 2014-04-29 MED ORDER — HYDROCODONE-ACETAMINOPHEN 5-325 MG PO TABS: ORAL_TABLET | ORAL | Status: DC

## 2014-04-29 NOTE — ED Notes (Signed)
Patient was outside feeding cats and tripped over them and hit her head on the concrete. She denies loss of consciousness. Patient is on plavix. She was able to drive here. Large hematoma to left forehead.

## 2014-04-29 NOTE — ED Provider Notes (Signed)
CSN: [id number]     Arrival date & time 04/29/14  1346 History   First MD Initiated Contact with Patient 04/29/14 1446     Chief Complaint  Patient presents with  . Fall  . Head Injury     (Consider location/radiation/quality/duration/timing/severity/associated sxs/prior Treatment) HPI Comments: Patient with history of DVT on Plavix presents with complaint of head injury. Patient was feeding cats at approximately 1:30 PM. She states that 2 cats ran underneath her legs and tripped her. She fell forward and landed on her left knee and left face. Patient has a large hematoma of the left forehead and abrasion overlying anterior L patella. She denies loss of consciousness. She denies nausea or vomiting. She has a headache. No neck pain or trouble moving her neck. No numbness or tingling in her extremities.   Patient is a 80 y.o. female presenting with fall and head injury. The history is provided by the patient and medical records.  Fall Associated symptoms include joint swelling. Pertinent negatives include no chest pain, fatigue, headaches, nausea, neck pain, numbness, vomiting or weakness.  Head Injury Associated symptoms: no headaches, no nausea, no neck pain, no numbness, no tinnitus and no vomiting     Past Medical History  Diagnosis Date  . Hypertension   . Hyperlipidemia   . MI (myocardial infarction) 2007    Thought to be takotsubo cardiomyopathy  . Coronary artery disease 2007    non-critcal coronary single disease  . Hx of fracture of fibula 2009    LEFT ,UNDER CARE DR Beverely LowSTEVE NORRIS  . History of DVT (deep vein thrombosis)     BEEN OFF  COUMADIN SINCE Sept 2012  . Takotsubo cardiomyopathy 2007    Result with normal EF as OF 2013   Past Surgical History  Procedure Laterality Date  . Appendectomy    . Tonsillectomy    . Nm myoview ltd  02/21/2012    low risk  and normal EF 70% or greater  . Transthoracic echocardiogram  07/31/2010    no MVP ,NORMAL SYSTOLIC FUNCTION   .  Cardiac catheterization  01/03/2006     50-60% LAD with some myocardial  bridging; NON ISCHEMIC  CARDIOMYOPATHY  with EF  30 to 35% no evidence of infarct -- anteroapical and inferapical wall motin abnormalities compatilble with Takotsubo-type syndrome  . Lower extremities venous doppler  01/29/2011    left saphenous vein -small amt of chroinc appearing calicification on anterior wall that was nonobstructing,mildly abnormal    Family History  Problem Relation Age of Onset  . Cancer Mother   . Heart attack Father   . Cancer Maternal Grandmother   . Heart disease Paternal Grandmother   . Heart disease Paternal Grandfather    History  Substance Use Topics  . Smoking status: Never Smoker   . Smokeless tobacco: Not on file  . Alcohol Use: Yes     Comment: daughter sts pt has been drinking multiple glasses of wine today   OB History    No data available     Review of Systems  Constitutional: Negative for fatigue.  HENT: Negative for tinnitus.   Eyes: Negative for photophobia, pain and visual disturbance.  Respiratory: Negative for shortness of breath.   Cardiovascular: Negative for chest pain.  Gastrointestinal: Negative for nausea and vomiting.  Musculoskeletal: Positive for joint swelling. Negative for back pain, gait problem and neck pain.  Skin: Positive for wound.  Neurological: Negative for dizziness, weakness, light-headedness, numbness and headaches.  Psychiatric/Behavioral: Negative  for confusion and decreased concentration.    Allergies  Codeine and Tetanus toxoids  Home Medications   Prior to Admission medications   Medication Sig Start Date End Date Taking? Authorizing Provider  aspirin EC 81 MG tablet Take 81 mg by mouth daily.   Yes Historical Provider, MD  atorvastatin (LIPITOR) 80 MG tablet Take 1 tablet (80 mg total) by mouth daily. 11/09/12  Yes Governor Rooks, MD  benazepril (LOTENSIN) 10 MG tablet Take 10 mg by mouth daily.   Yes Historical Provider, MD   carvedilol (COREG) 12.5 MG tablet Take 12.5 mg by mouth 2 (two) times daily with a meal.   Yes Historical Provider, MD  cholecalciferol (VITAMIN D) 1000 UNITS tablet Take 1,000 Units by mouth daily.   Yes Historical Provider, MD  Influenza vac split quadrivalent PF (FLUARIX) 0.5 ML injection Inject 0.5 mLs into the muscle once.   Yes Historical Provider, MD  pneumococcal 13-valent conjugate vaccine (PREVNAR 13) SUSP injection Inject 0.5 mLs into the muscle once.   Yes Historical Provider, MD   BP 170/109 mmHg  Pulse 89  Temp(Src) 97.7 F (36.5 C) (Oral)  Resp 21  SpO2 100%   Physical Exam  Constitutional: She is oriented to person, place, and time. She appears well-developed and well-nourished.  HENT:  Head: Normocephalic. Head is without raccoon's eyes and without Battle's sign.  Right Ear: Tympanic membrane, external ear and ear canal normal. No hemotympanum.  Left Ear: Tympanic membrane, external ear and ear canal normal. No hemotympanum.  Nose: Nose normal. No nasal septal hematoma.  Mouth/Throat: Uvula is midline, oropharynx is clear and moist and mucous membranes are normal.  3cm hematoma L forehead.   Eyes: Conjunctivae, EOM and lids are normal. Pupils are equal, round, and reactive to light. Right eye exhibits no nystagmus. Left eye exhibits no nystagmus.  No visible hyphema noted  Neck: Normal range of motion. Neck supple.  Cardiovascular: Normal rate and regular rhythm.   Pulmonary/Chest: Effort normal and breath sounds normal.  Abdominal: Soft. There is no tenderness.  Musculoskeletal:       Right hip: Normal.       Left hip: Normal.       Right knee: Normal. No tenderness found.       Left knee: She exhibits ecchymosis. She exhibits normal range of motion, no swelling and no effusion. Tenderness found.       Cervical back: She exhibits normal range of motion, no tenderness and no bony tenderness.       Thoracic back: She exhibits no tenderness and no bony tenderness.        Lumbar back: She exhibits no tenderness and no bony tenderness.  Neurological: She is alert and oriented to person, place, and time. She has normal strength and normal reflexes. No cranial nerve deficit or sensory deficit. Coordination normal. GCS eye subscore is 4. GCS verbal subscore is 5. GCS motor subscore is 6.  Skin: Skin is warm and dry.  Psychiatric: She has a normal mood and affect.  Nursing note and vitals reviewed.   ED Course  Procedures (including critical care time) Labs Review Labs Reviewed - No data to display  Imaging Review Ct Head Wo Contrast  04/29/2014   CLINICAL DATA:  Was outside feeding cats and tripped striking head on concrete, denies loss of consciousness, on Plavix, LEFT frontal hematoma, personal history of hypertension, hyperlipidemia, coronary artery disease post MI, Takotsubo cardiomyopathy  EXAM: CT HEAD WITHOUT CONTRAST  TECHNIQUE: Contiguous axial  images were obtained from the base of the skull through the vertex without intravenous contrast.  COMPARISON:  01/03/2013  FINDINGS: Generalized atrophy.  Normal ventricular morphology.  No midline shift or mass effect.  Small vessel chronic ischemic changes of deep cerebral white matter.  No intracranial hemorrhage, mass lesion, or evidence of acute infarction.  No extra-axial fluid collections.  Large LEFT frontal and periorbital scalp hematoma.  Intraorbital soft tissue planes clear.  No fractures identified.  IMPRESSION: Atrophy with small vessel chronic ischemic changes of deep cerebral white matter.  No acute intracranial abnormalities.   Electronically Signed   By: Ulyses Southward M.D.   On: 04/29/2014 15:27   Dg Knee Complete 4 Views Left  04/29/2014   CLINICAL DATA:  Tripped over CT is, fall on knees. Knee pain, swelling and bruising to left knee.  EXAM: LEFT KNEE - COMPLETE 4+ VIEW  COMPARISON:  None.  FINDINGS: Degenerative changes within the left knee, most pronounced in the the medial and patellofemoral  compartments with joint space narrowing and spurring. Significant anterior soft tissue swelling. Small joint effusion. No fracture, subluxation or dislocation.  IMPRESSION: No acute bony abnormality. Degenerative changes. Anterior soft tissue swelling with small joint effusion.   Electronically Signed   By: Charlett Nose M.D.   On: 04/29/2014 15:30     EKG Interpretation None      2:55 PM Patient seen and examined. Work-up initiated.   Vital signs reviewed and are as follows: BP 170/109 mmHg  Pulse 89  Temp(Src) 97.7 F (36.5 C) (Oral)  Resp 21  SpO2 100%  4:18 PM Patient informed of results. Pt seen by Dr. Rhunette Croft. Will give knee sleeve prior to d/c. Encouraged to f/u with ortho in 2 weeks if she has appreciable swelling in knee.   Patient was counseled on head injury precautions and symptoms that should indicate their return to the ED.  These include severe worsening headache, vision changes, confusion, loss of consciousness, trouble walking, nausea & vomiting, or weakness/tingling in extremities.    Patient counseled on use of narcotic pain medications. Counseled not to combine these medications with others containing tylenol. Urged not to drink alcohol, drive, or perform any other activities that requires focus while taking these medications. The patient verbalizes understanding and agrees with the plan.  Discussed using pain medication only under direct supervision at the lowest possible dose for safety reasons.   MDM   Final diagnoses:  Fall  Hematoma  Minor head injury without loss of consciousness, initial encounter  Abrasion of left knee, initial encounter   Head injury: On Plavix, head CT is negative. Patient with appreciable hematoma. No fractures suggested on exam or on imaging. No symptoms of concussion. Return instructions discussed with patient.  Knee pain: Patient with ecchymosis. X-ray shows small effusion, no fractures. Patient is ambulatory. Knee sleeve for  support. Patient does have orthopedist.  No dangerous or life-threatening conditions suspected or identified by history, physical exam, and by work-up. No indications for hospitalization identified.     Renne Crigler, PA-C 04/29/14 1621  Derwood Kaplan, MD 05/01/14 1701

## 2014-04-29 NOTE — ED Notes (Signed)
Patient transported to CT 

## 2014-04-29 NOTE — Discharge Instructions (Signed)
Please read and follow all provided instructions.  Your diagnoses today include:  1. Hematoma   2. Fall   3. Minor head injury without loss of consciousness, initial encounter   4. Abrasion of left knee, initial encounter     Tests performed today include:  CT scan of your head that did not show any serious injury.  X-ray of knee - shows small amount of swelling in the joint but no broken bones  Vital signs. See below for your results today.   Medications prescribed:   Vicodin (hydrocodone/acetaminophen) - narcotic pain medication  DO NOT drive or perform any activities that require you to be awake and alert because this medicine can make you drowsy. BE VERY CAREFUL not to take multiple medicines containing Tylenol (also called acetaminophen). Doing so can lead to an overdose which can damage your liver and cause liver failure and possibly death.  Use pain medication only under direct supervision at the lowest possible dose needed to control your pain because it can lead to falls.   Take any prescribed medications only as directed.  Home care instructions:  Follow any educational materials contained in this packet.  Use Tylenol as directed on the packaging for pain and soreness.   BE VERY CAREFUL not to take multiple medicines containing Tylenol (also called acetaminophen). Doing so can lead to an overdose which can damage your liver and cause liver failure and possibly death.   Follow-up instructions: Please follow-up with your primary care provider in the next 3 days for further evaluation of your symptoms.   Return instructions:  SEEK IMMEDIATE MEDICAL ATTENTION IF:  There is confusion or drowsiness (although children frequently become drowsy after injury).   You cannot awaken the injured person.   You have more than one episode of vomiting.   You notice dizziness or unsteadiness which is getting worse, or inability to walk.   You have convulsions or  unconsciousness.   You experience severe, persistent headaches not relieved by Tylenol.  You cannot use arms or legs normally.   There are changes in pupil sizes. (This is the black center in the colored part of the eye)   There is clear or bloody discharge from the nose or ears.   You have change in speech, vision, swallowing, or understanding.   Localized weakness, numbness, tingling, or change in bowel or bladder control.  You have any other emergent concerns.  Additional Information: You have had a head injury which does not appear to require admission at this time.  Your vital signs today were: BP 170/109 mmHg   Pulse 89   Temp(Src) 97.7 F (36.5 C) (Oral)   Resp 21   SpO2 100% If your blood pressure (BP) was elevated above 135/85 this visit, please have this repeated by your doctor within one month. --------------

## 2014-04-29 NOTE — ED Notes (Signed)
PA at bedside.

## 2014-04-29 NOTE — ED Notes (Signed)
MD at bedside. 

## 2014-04-29 NOTE — ED Notes (Signed)
Knee sleeve applied. Bacitracin applied to head and knee. Dry dressing applied. Pt A&Ox4. No other questions/concerns. Knows not to drink/drive with medication. Will follow up with orthopedics in 2 weeks. Ambulatory with steady gait.

## 2015-05-26 ENCOUNTER — Encounter (HOSPITAL_COMMUNITY): Payer: Self-pay | Admitting: *Deleted

## 2015-05-26 ENCOUNTER — Inpatient Hospital Stay (HOSPITAL_COMMUNITY)
Admission: EM | Admit: 2015-05-26 | Discharge: 2015-06-01 | DRG: 392 | Disposition: A | Discharge status: Home Health | Payer: Medicare Other | Attending: Internal Medicine | Admitting: Internal Medicine

## 2015-05-26 DIAGNOSIS — I2510 Atherosclerotic heart disease of native coronary artery without angina pectoris: Secondary | ICD-10-CM | POA: Diagnosis present

## 2015-05-26 DIAGNOSIS — E8809 Other disorders of plasma-protein metabolism, not elsewhere classified: Secondary | ICD-10-CM | POA: Diagnosis present

## 2015-05-26 DIAGNOSIS — I10 Essential (primary) hypertension: Secondary | ICD-10-CM | POA: Diagnosis present

## 2015-05-26 DIAGNOSIS — K5720 Diverticulitis of large intestine with perforation and abscess without bleeding: Secondary | ICD-10-CM | POA: Diagnosis not present

## 2015-05-26 DIAGNOSIS — Z79899 Other long term (current) drug therapy: Secondary | ICD-10-CM | POA: Diagnosis not present

## 2015-05-26 DIAGNOSIS — Z87891 Personal history of nicotine dependence: Secondary | ICD-10-CM

## 2015-05-26 DIAGNOSIS — R109 Unspecified abdominal pain: Secondary | ICD-10-CM | POA: Diagnosis present

## 2015-05-26 DIAGNOSIS — D72829 Elevated white blood cell count, unspecified: Secondary | ICD-10-CM | POA: Diagnosis present

## 2015-05-26 DIAGNOSIS — Z86718 Personal history of other venous thrombosis and embolism: Secondary | ICD-10-CM | POA: Diagnosis not present

## 2015-05-26 DIAGNOSIS — R740 Nonspecific elevation of levels of transaminase and lactic acid dehydrogenase [LDH]: Secondary | ICD-10-CM | POA: Diagnosis present

## 2015-05-26 DIAGNOSIS — E785 Hyperlipidemia, unspecified: Secondary | ICD-10-CM | POA: Diagnosis present

## 2015-05-26 DIAGNOSIS — Z887 Allergy status to serum and vaccine status: Secondary | ICD-10-CM

## 2015-05-26 DIAGNOSIS — R112 Nausea with vomiting, unspecified: Secondary | ICD-10-CM | POA: Diagnosis present

## 2015-05-26 DIAGNOSIS — Z7982 Long term (current) use of aspirin: Secondary | ICD-10-CM | POA: Diagnosis not present

## 2015-05-26 DIAGNOSIS — Z885 Allergy status to narcotic agent status: Secondary | ICD-10-CM

## 2015-05-26 DIAGNOSIS — K5900 Constipation, unspecified: Secondary | ICD-10-CM | POA: Diagnosis present

## 2015-05-26 DIAGNOSIS — I252 Old myocardial infarction: Secondary | ICD-10-CM | POA: Diagnosis not present

## 2015-05-26 DIAGNOSIS — E876 Hypokalemia: Secondary | ICD-10-CM | POA: Diagnosis present

## 2015-05-26 DIAGNOSIS — F05 Delirium due to known physiological condition: Secondary | ICD-10-CM | POA: Diagnosis present

## 2015-05-26 DIAGNOSIS — R630 Anorexia: Secondary | ICD-10-CM | POA: Diagnosis present

## 2015-05-26 DIAGNOSIS — B9620 Unspecified Escherichia coli [E. coli] as the cause of diseases classified elsewhere: Secondary | ICD-10-CM | POA: Diagnosis present

## 2015-05-26 DIAGNOSIS — K630 Abscess of intestine: Secondary | ICD-10-CM | POA: Diagnosis present

## 2015-05-26 DIAGNOSIS — E860 Dehydration: Secondary | ICD-10-CM | POA: Diagnosis present

## 2015-05-26 DIAGNOSIS — R7401 Elevation of levels of liver transaminase levels: Secondary | ICD-10-CM | POA: Diagnosis present

## 2015-05-26 DIAGNOSIS — R1032 Left lower quadrant pain: Secondary | ICD-10-CM

## 2015-05-26 DIAGNOSIS — R1084 Generalized abdominal pain: Secondary | ICD-10-CM

## 2015-05-26 DIAGNOSIS — K5730 Diverticulosis of large intestine without perforation or abscess without bleeding: Secondary | ICD-10-CM

## 2015-05-26 LAB — COMPREHENSIVE METABOLIC PANEL
ALK PHOS: 123 U/L (ref 38–126)
ALT: 61 U/L — ABNORMAL HIGH (ref 14–54)
ANION GAP: 13 (ref 5–15)
AST: 91 U/L — ABNORMAL HIGH (ref 15–41)
Albumin: 2.6 g/dL — ABNORMAL LOW (ref 3.5–5.0)
BUN: 22 mg/dL — ABNORMAL HIGH (ref 6–20)
CALCIUM: 8.8 mg/dL — AB (ref 8.9–10.3)
CO2: 24 mmol/L (ref 22–32)
Chloride: 92 mmol/L — ABNORMAL LOW (ref 101–111)
Creatinine, Ser: 0.96 mg/dL (ref 0.44–1.00)
GFR, EST NON AFRICAN AMERICAN: 55 mL/min — AB (ref >60)
Glucose, Bld: 118 mg/dL — ABNORMAL HIGH (ref 65–99)
Potassium: 3.3 mmol/L — ABNORMAL LOW (ref 3.5–5.1)
SODIUM: 129 mmol/L — AB (ref 135–145)
Total Bilirubin: 1.9 mg/dL — ABNORMAL HIGH (ref 0.3–1.2)
Total Protein: 7.4 g/dL (ref 6.5–8.1)

## 2015-05-26 LAB — CBC
HCT: 37.9 % (ref 36.0–46.0)
HEMOGLOBIN: 13 g/dL (ref 12.0–15.0)
MCH: 31.3 pg (ref 26.0–34.0)
MCHC: 34.3 g/dL (ref 30.0–36.0)
MCV: 91.3 fL (ref 78.0–100.0)
PLATELETS: 419 10*3/uL — AB (ref 150–400)
RBC: 4.15 MIL/uL (ref 3.87–5.11)
RDW: 14 % (ref 11.5–15.5)
WBC: 16.4 10*3/uL — AB (ref 4.0–10.5)

## 2015-05-26 LAB — LIPASE, BLOOD: LIPASE: 29 U/L (ref 11–51)

## 2015-05-26 MED ORDER — ACETAMINOPHEN 325 MG PO TABS: 650.0000 mg | ORAL_TABLET | Freq: Four times a day (QID) | ORAL | Status: DC | PRN

## 2015-05-26 MED ORDER — PIPERACILLIN-TAZOBACTAM 3.375 G IVPB 30 MIN
3.3750 g | Freq: Once | INTRAVENOUS | Status: AC
  Administered 2015-05-26: 3.375 g via INTRAVENOUS
  Filled 2015-05-26: qty 50

## 2015-05-26 MED ORDER — ALUM & MAG HYDROXIDE-SIMETH 200-200-20 MG/5ML PO SUSP: 30.0000 mL | Freq: Four times a day (QID) | ORAL | Status: DC | PRN

## 2015-05-26 MED ORDER — PIPERACILLIN-TAZOBACTAM 3.375 G IVPB
3.3750 g | Freq: Three times a day (TID) | INTRAVENOUS | Status: DC
  Administered 2015-05-27 – 2015-05-31 (×13): 3.375 g via INTRAVENOUS
  Filled 2015-05-26 (×15): qty 50

## 2015-05-26 MED ORDER — ONDANSETRON HCL 4 MG/2ML IJ SOLN: 4.0000 mg | Freq: Four times a day (QID) | INTRAMUSCULAR | Status: DC | PRN

## 2015-05-26 MED ORDER — PIPERACILLIN-TAZOBACTAM 3.375 G IVPB 30 MIN
3.3750 g | Freq: Three times a day (TID) | INTRAVENOUS | Status: DC
Start: 2015-05-26 — End: 2015-05-26

## 2015-05-26 MED ORDER — ONDANSETRON HCL 4 MG PO TABS: 4.0000 mg | ORAL_TABLET | Freq: Four times a day (QID) | ORAL | Status: DC | PRN

## 2015-05-26 MED ORDER — POTASSIUM CHLORIDE 10 MEQ/100ML IV SOLN
10.0000 meq | INTRAVENOUS | Status: AC
  Administered 2015-05-26: 10 meq via INTRAVENOUS
  Filled 2015-05-26: qty 100

## 2015-05-26 MED ORDER — ACETAMINOPHEN 650 MG RE SUPP: 650.0000 mg | Freq: Four times a day (QID) | RECTAL | Status: DC | PRN

## 2015-05-26 MED ORDER — SODIUM CHLORIDE 0.9 % IV BOLUS (SEPSIS)
1000.0000 mL | Freq: Once | INTRAVENOUS | Status: AC
  Administered 2015-05-26: 1000 mL via INTRAVENOUS

## 2015-05-26 MED ORDER — WHITE PETROLATUM GEL
Status: AC
  Filled 2015-05-26: qty 1

## 2015-05-26 MED ORDER — SODIUM CHLORIDE 0.9 % IV SOLN
INTRAVENOUS | Status: DC
  Administered 2015-05-27 – 2015-05-28 (×2): via INTRAVENOUS

## 2015-05-26 MED ORDER — HYDROMORPHONE HCL 1 MG/ML IJ SOLN
0.5000 mg | INTRAMUSCULAR | Status: DC | PRN
  Administered 2015-05-27 (×2): 1 mg via INTRAVENOUS
  Filled 2015-05-26 (×2): qty 1

## 2015-05-26 MED ORDER — SODIUM CHLORIDE 0.9 % IJ SOLN
3.0000 mL | Freq: Two times a day (BID) | INTRAMUSCULAR | Status: DC
  Administered 2015-05-27 – 2015-06-01 (×5): 3 mL via INTRAVENOUS

## 2015-05-26 NOTE — Consult Note (Signed)
Reason for Consult:abdominal pain Referring Physician: [name] is an 81 y.o. female.  HPI: 81 yo female presents with no bowel movements for 1 week, decreased appetite, mild abdominal pain. She has not had any previous similar episodes. She had a CT done at outside showing a large abscess consistent with perforated diverticulitis. She denies fevers or chills. Had colonoscopy in the last 5 years that was negative for malignancy.  Past Medical History  Diagnosis Date  . Hypertension   . Hyperlipidemia   . MI (myocardial infarction) ([place]) 2007    Thought to be takotsubo cardiomyopathy  . Coronary artery disease 2007    non-critcal coronary single disease  . Hx of fracture of fibula 2009    LEFT ,UNDER CARE DR Netta Cedars  . History of DVT (deep vein thrombosis)     BEEN OFF  COUMADIN SINCE Sept 2012  . Takotsubo cardiomyopathy 2007    Result with normal EF as OF 2013    Past Surgical History  Procedure Laterality Date  . Appendectomy    . Tonsillectomy    . Nm myoview ltd  02/21/2012    low risk  and normal EF 70% or greater  . Transthoracic echocardiogram  07/31/2010    no MVP ,NORMAL SYSTOLIC FUNCTION   . Cardiac catheterization  01/03/2006     50-60% LAD with some myocardial  bridging; NON ISCHEMIC  CARDIOMYOPATHY  with EF  30 to 35% no evidence of infarct -- anteroapical and inferapical wall motin abnormalities compatilble with Takotsubo-type syndrome  . Lower extremities venous doppler  01/29/2011    left saphenous vein -small amt of chroinc appearing calicification on anterior wall that was nonobstructing,mildly abnormal     Family History  Problem Relation Age of Onset  . Cancer Mother   . Heart attack Father   . Cancer Maternal Grandmother   . Heart disease Paternal Grandmother   . Heart disease Paternal Grandfather     Social History:  reports that she has never smoked. She does not have any smokeless tobacco history on file. She reports that she  drinks alcohol. She reports that she does not use illicit drugs.  Allergies:  Allergies  Allergen Reactions  . Codeine Nausea And Vomiting  . Tetanus Toxoids Other (See Comments)    Swollen and red ( patient informed that she had to take it in three doses)    Medications: I have reviewed the patient's current medications.  Results for orders placed or performed during the hospital encounter of 05/26/15 (from the past 48 hour(s))  Lipase, blood     Status: None   Collection Time: 05/26/15  5:01 PM  Result Value Ref Range   Lipase 29 11 - 51 U/L  Comprehensive metabolic panel     Status: Abnormal   Collection Time: 05/26/15  5:01 PM  Result Value Ref Range   Sodium 129 (L) 135 - 145 mmol/L   Potassium 3.3 (L) 3.5 - 5.1 mmol/L   Chloride 92 (L) 101 - 111 mmol/L   CO2 24 22 - 32 mmol/L   Glucose, Bld 118 (H) 65 - 99 mg/dL   BUN 22 (H) 6 - 20 mg/dL   Creatinine, Ser 0.96 0.44 - 1.00 mg/dL   Calcium 8.8 (L) 8.9 - 10.3 mg/dL   Total Protein 7.4 6.5 - 8.1 g/dL   Albumin 2.6 (L) 3.5 - 5.0 g/dL   AST 91 (H) 15 - 41 U/L   ALT 61 (H) 14 - 54  U/L   Alkaline Phosphatase 123 38 - 126 U/L   Total Bilirubin 1.9 (H) 0.3 - 1.2 mg/dL   GFR calc non Af Amer 55 (L) >60 mL/min   GFR calc Af Amer >60 >60 mL/min    Comment: (NOTE) The eGFR has been calculated using the CKD EPI equation. This calculation has not been validated in all clinical situations. eGFR's persistently <60 mL/min signify possible Chronic Kidney Disease.    Anion gap 13 5 - 15  CBC     Status: Abnormal   Collection Time: 05/26/15  5:01 PM  Result Value Ref Range   WBC 16.4 (H) 4.0 - 10.5 K/uL   RBC 4.15 3.87 - 5.11 MIL/uL   Hemoglobin 13.0 12.0 - 15.0 g/dL   HCT 37.9 36.0 - 46.0 %   MCV 91.3 78.0 - 100.0 fL   MCH 31.3 26.0 - 34.0 pg   MCHC 34.3 30.0 - 36.0 g/dL   RDW 14.0 11.5 - 15.5 %   Platelets 419 (H) 150 - 400 K/uL    No results found.  Review of Systems  Constitutional: Negative for fever and chills.   HENT: Negative for hearing loss.   Eyes: Negative for blurred vision and double vision.  Respiratory: Negative for cough and hemoptysis.   Cardiovascular: Negative for chest pain and palpitations.  Gastrointestinal: Positive for nausea, abdominal pain and constipation. Negative for vomiting.  Genitourinary: Negative for dysuria and urgency.  Musculoskeletal: Negative for myalgias and neck pain.  Skin: Negative for itching and rash.  Neurological: Negative for dizziness, tingling and headaches.  Endo/Heme/Allergies: Does not bruise/bleed easily.  Psychiatric/Behavioral: Positive for memory loss. Negative for depression and suicidal ideas.   Blood pressure 123/77, pulse 68, temperature 98.5 F (36.9 C), temperature source Oral, resp. rate 23, SpO2 95 %. Physical Exam  Vitals reviewed. Constitutional: She is oriented to person, place, and time. She appears well-developed and well-nourished.  HENT:  Head: Normocephalic and atraumatic.  Eyes: Conjunctivae and EOM are normal. Pupils are equal, round, and reactive to light.  Neck: Normal range of motion. Neck supple.  Cardiovascular: Normal rate and regular rhythm.   Respiratory: Effort normal and breath sounds normal.  GI: Soft. Bowel sounds are normal. She exhibits distension. There is tenderness. There is no rebound and no guarding.  Genitourinary: Guaiac negative stool.  Musculoskeletal: Normal range of motion.  Neurological: She is alert and oriented to person, place, and time.  Skin: Skin is warm and dry.  Psychiatric: She has a normal mood and affect. Her behavior is normal.    Assessment/Plan: 81 yo female with diverticulitis with abscess. WBC 16.5 hemodynamically stable. She is more confused than baseline as did not remember she has a pessary in place or any of her medications consistent with delirium from acute infection. -IV antibiotics  -plan for IR drainage procedure tomorrow -NPO except ice chips -hold  antiplatelets  Rita Harding 05/26/2015, 7:55 PM

## 2015-05-26 NOTE — ED Notes (Signed)
Attempted to call report x 1, was told that 5W was capped at 30 pts for the night. This RN called patient placement, waiting for patient placement call back.

## 2015-05-26 NOTE — H&P (Signed)
[facility] Admission History and Physical       Rita Harding VWU:[id number] DOB: 1933-12-06 DOA: 05/26/2015  Referring physician: EDP PCP: Pcp Not In System  Dr. Wylene Simmer Specialists:   Chief Complaint: ABD Pain  HPI: Rita Harding is a 81 y.o. female with a history of CAD, HTN, and Hyperlipidemia who presents to the ED after having an outpatient CT scan of the ABD which revealed Sigmoid Diverticulitis with abscess and perforation measuring 4.6 x 2.7 cm.   She had been having 2/10 Dull constant LLQ ABD Pain with Nausea and Vomiting x 2 weeks, she also had constipation until today for 5 days.    She denies having fevers or chills, or hematemesis, hematochezia or melena.    In the ED, General Surgery was consulted and saw patient and arrangements were made for IR drainage in the AM.   IV Zosyn was started.     Review of Systems:  Constitutional: No Weight Loss, No Weight Gain, Night Sweats, Fevers, Chills, Dizziness, Light Headedness, Fatigue, or Generalized Weakness HEENT: No Headaches, Difficulty Swallowing,Tooth/Dental Problems,Sore Throat,  No Sneezing, Rhinitis, Ear Ache, Nasal Congestion, or Post Nasal Drip,  Cardio-vascular:  No Chest pain, Orthopnea, PND, Edema in Lower Extremities, Anasarca, Dizziness, Palpitations  Resp: No Dyspnea, No DOE, No Productive Cough, No Non-Productive Cough, No Hemoptysis, No Wheezing.    GI: No Heartburn, Indigestion, +Abdominal Pain, +Nausea, +Vomiting, Diarrhea, +Constipation, Hematemesis, Hematochezia, Melena, Change in Bowel Habits,  Loss of Appetite  GU: No Dysuria, No Change in Color of Urine, No Urgency or Urinary Frequency, No Flank pain.  Musculoskeletal: No Joint Pain or Swelling, No Decreased Range of Motion, No Back Pain.  Neurologic: No Syncope, No Seizures, Muscle Weakness, Paresthesia, Vision Disturbance or Loss, No Diplopia, No Vertigo, No Difficulty Walking,  Skin: No Rash or Lesions. Psych: No Change in Mood or  Affect, No Depression or Anxiety, No Memory loss, No Confusion, or Hallucinations   Past Medical History  Diagnosis Date  . Hypertension   . Hyperlipidemia   . MI (myocardial infarction) (HCC) 2007    Thought to be takotsubo cardiomyopathy  . Coronary artery disease 2007    non-critcal coronary single disease  . Hx of fracture of fibula 2009    LEFT ,UNDER CARE DR Beverely Low  . History of DVT (deep vein thrombosis)     BEEN OFF  COUMADIN SINCE Sept 2012  . Takotsubo cardiomyopathy 2007    Result with normal EF as OF 2013     Past Surgical History  Procedure Laterality Date  . Appendectomy    . Tonsillectomy    . Nm myoview ltd  02/21/2012    low risk  and normal EF 70% or greater  . Transthoracic echocardiogram  07/31/2010    no MVP ,NORMAL SYSTOLIC FUNCTION   . Cardiac catheterization  01/03/2006     50-60% LAD with some myocardial  bridging; NON ISCHEMIC  CARDIOMYOPATHY  with EF  30 to 35% no evidence of infarct -- anteroapical and inferapical wall motin abnormalities compatilble with Takotsubo-type syndrome  . Lower extremities venous doppler  01/29/2011    left saphenous vein -small amt of chroinc appearing calicification on anterior wall that was nonobstructing,mildly abnormal       Prior to Admission medications   Medication Sig Start Date End Date Taking? Authorizing Provider  aspirin EC 81 MG tablet Take 81 mg by mouth daily.   Yes Historical Provider, MD  atorvastatin (LIPITOR) 80 MG tablet Take 1  tablet (80 mg total) by mouth daily. 11/09/12  Yes Susa Griffins, MD  benazepril (LOTENSIN) 10 MG tablet Take 10 mg by mouth daily.   Yes Historical Provider, MD  carvedilol (COREG) 12.5 MG tablet Take 12.5 mg by mouth 2 (two) times daily with a meal.   Yes Historical Provider, MD  cholecalciferol (VITAMIN D) 1000 UNITS tablet Take 1,000 Units by mouth daily.   Yes Historical Provider, MD  HYDROcodone-acetaminophen (NORCO/VICODIN) 5-325 MG per tablet Take 1/2-1 tablets  every 6 hours as needed for severe pain 04/29/14   Renne Crigler, PA-C  Influenza vac split quadrivalent PF (FLUARIX) 0.5 ML injection Inject 0.5 mLs into the muscle once.    Historical Provider, MD  pneumococcal 13-valent conjugate vaccine (PREVNAR 13) SUSP injection Inject 0.5 mLs into the muscle once.    Historical Provider, MD     Allergies  Allergen Reactions  . Codeine Nausea And Vomiting  . Tetanus Toxoids Other (See Comments)    Swollen and red ( patient informed that she had to take it in three doses)    Social History:  reports that she has never smoked. She does not have any smokeless tobacco history on file. She reports that she drinks alcohol. She reports that she does not use illicit drugs.    Family History  Problem Relation Age of Onset  . Cancer Mother   . Heart attack Father   . Cancer Maternal Grandmother   . Heart disease Paternal Grandmother   . Heart disease Paternal Grandfather        Physical Exam:  GEN:  Pleasant Well Nourished and Well Developed  81 y.o. Caucasian female examined and in no acute distress; cooperative with exam Filed Vitals:   05/26/15 1652 05/26/15 1900 05/26/15 1930 05/26/15 2000  BP: 105/80 126/68 123/77 123/69  Pulse: 89 106 68 37  Temp: 98.5 F (36.9 C)     TempSrc: Oral     Resp: SpO2: 98% 96% 95% 98%   Blood pressure 123/69, pulse 37, temperature 98.5 F (36.9 C), temperature source Oral, resp. rate 28, SpO2 98 %. PSYCH: She is alert and oriented x4; does not appear anxious does not appear depressed; affect is normal HEENT: Normocephalic and Atraumatic, Mucous membranes pink; PERRLA; EOM intact; Fundi:  Benign;  No scleral icterus, Nares: Patent, Oropharynx: Clear, Fair Dentition,    Neck:  FROM, No Cervical Lymphadenopathy nor Thyromegaly or Carotid Bruit; No JVD; Breasts:: Not examined CHEST WALL: No tenderness CHEST: Normal respiration, clear to auscultation bilaterally HEART: Regular rate and rhythm; no  murmurs rubs or gallops BACK: No kyphosis or scoliosis; No CVA tenderness ABDOMEN: Positive Bowel Sounds, Soft Mildly Tender, No Rebound or Guarding; No Masses, No Organomegaly. Rectal Exam: Not done EXTREMITIES: No Cyanosis, Clubbing, or Edema; No Ulcerations. Genitalia: not examined PULSES: 2+ and symmetric SKIN: Normal hydration no rash or ulceration CNS:  Alert and Oriented x 4, No Focal Deficits Vascular: pulses palpable throughout    Labs on Admission:  Basic Metabolic Panel:  Recent Labs Lab 05/26/15 1701  NA 129*  K 3.3*  CL 92*  CO2 24  GLUCOSE 118*  BUN 22*  CREATININE 0.96  CALCIUM 8.8*   Liver Function Tests:  Recent Labs Lab 05/26/15 1701  AST 91*  ALT 61*  ALKPHOS 123  BILITOT 1.9*  PROT 7.4  ALBUMIN 2.6*    Recent Labs Lab 05/26/15 1701  LIPASE 29   No results for input(s): AMMONIA in the last  168 hours. CBC:  Recent Labs Lab 05/26/15 1701  WBC 16.4*  HGB 13.0  HCT 37.9  MCV 91.3  PLT 419*   Cardiac Enzymes: No results for input(s): CKTOTAL, CKMB, CKMBINDEX, TROPONINI in the last 168 hours.  BNP (last 3 results) No results for input(s): BNP in the last 8760 hours.  ProBNP (last 3 results) No results for input(s): PROBNP in the last 8760 hours.  CBG: No results for input(s): GLUCAP in the last 168 hours.  Radiological Exams on Admission: No results found.   EKG: Independently reviewed.    Assessment/Plan:      81 y.o. female with  Principal Problem:   1.      Colonic diverticular abscess/Intestinal diverticular abscess- on Ct scan     IV Zosyn    General Surgery consulted and saw in ED    IR to evaluate for drainage in AM    NPO after Midnight  Active Problems:   2.      Abdominal pain- due to #1    IV ABx    IV Dilaudid PRN     3.      Nausea and vomiting- due to #1 and Constipation, just had a BM    PRN IV Zofran      4.      Elevated Transminase Levels    Monitor Trend       5.       Hypoalbuminemia    Nutrition consult      6.      Leukocytosis- due to #1    Monitor Trend      7.    Coronary artery disease    Stable.        8.    Hypertension    On Carvedilol and Benazepril Rx at home    IV Lopressor while NPO    Monitor BPs     9.    Hyperlipidemia    Resume Atorvastatin when taking PO   10.    DVT Prophylaxis     SCDs    Code Status:     FULL CODE      Family Communication:   Husband at Bedside       Disposition Plan:    Inpatient Status        Time spent:  14 Minutes      Ron Parker [facility] Pager [phone]   If 7AM -7PM Please Contact the Day Rounding Team MD for [facility]  If 7PM-7AM, Please Contact Night-Floor Coverage  [email or web address] Password [email or web address] 05/26/2015, 8:52 PM     ADDENDUM:   Patient was seen and examined on 05/26/2015

## 2015-05-26 NOTE — ED Notes (Signed)
Pt reports that 2 weeks ago she had abdominal pain and "not feeling well" pt reports N/V. CT scan was done today that showed diverticulitis.

## 2015-05-26 NOTE — ED Provider Notes (Signed)
CSN: [id number]     Arrival date & time 05/26/15  1627 History   First MD Initiated Contact with Patient 05/26/15 1738     Chief Complaint  Patient presents with  . Abdominal Pain     (Consider location/radiation/quality/duration/timing/severity/associated sxs/prior Treatment) Patient is a 81 y.o. female presenting with abdominal pain. The history is provided by the patient.  Abdominal Pain Pain location:  Generalized Pain quality: aching and bloating   Pain radiates to:  Does not radiate Pain severity:  Moderate Onset quality:  Gradual Duration:  2 weeks Timing:  Constant Progression:  Worsening Chronicity:  New Context: previous surgery (pt had appendectomy years ago)   Context: not recent illness, not recent sexual activity, not recent travel, not sick contacts, not suspicious food intake and not trauma   Relieved by:  None tried Worsened by:  Palpation and eating Ineffective treatments:  Position changes and movement Associated symptoms: anorexia, constipation (has not had bowel movement in 1 week), fatigue and nausea   Associated symptoms: no belching, no chest pain, no chills, no cough, no diarrhea, no dysuria, no fever, no hematemesis, no hematochezia, no hematuria, no melena, no shortness of breath, no vaginal bleeding, no vaginal discharge and no vomiting   Risk factors: not obese     Past Medical History  Diagnosis Date  . Hypertension   . Hyperlipidemia   . MI (myocardial infarction) (HCC) 2007    Thought to be takotsubo cardiomyopathy  . Coronary artery disease 2007    non-critcal coronary single disease  . Hx of fracture of fibula 2009    LEFT ,UNDER CARE DR Beverely Low  . History of DVT (deep vein thrombosis)     BEEN OFF  COUMADIN SINCE Sept 2012  . Takotsubo cardiomyopathy 2007    Result with normal EF as OF 2013   Past Surgical History  Procedure Laterality Date  . Appendectomy    . Tonsillectomy    . Nm myoview ltd  02/21/2012    low risk  and  normal EF 70% or greater  . Transthoracic echocardiogram  07/31/2010    no MVP ,NORMAL SYSTOLIC FUNCTION   . Cardiac catheterization  01/03/2006     50-60% LAD with some myocardial  bridging; NON ISCHEMIC  CARDIOMYOPATHY  with EF  30 to 35% no evidence of infarct -- anteroapical and inferapical wall motin abnormalities compatilble with Takotsubo-type syndrome  . Lower extremities venous doppler  01/29/2011    left saphenous vein -small amt of chroinc appearing calicification on anterior wall that was nonobstructing,mildly abnormal    Family History  Problem Relation Age of Onset  . Cancer Mother   . Heart attack Father   . Cancer Maternal Grandmother   . Heart disease Paternal Grandmother   . Heart disease Paternal Grandfather    Social History  Substance Use Topics  . Smoking status: Never Smoker   . Smokeless tobacco: None  . Alcohol Use: Yes     Comment: daughter sts pt has been drinking multiple glasses of wine today   OB History    No data available     Review of Systems  Constitutional: Positive for appetite change and fatigue. Negative for fever and chills.  HENT: Negative for congestion and facial swelling.   Eyes: Negative for pain.  Respiratory: Negative for cough, chest tightness and shortness of breath.   Cardiovascular: Negative for chest pain.  Gastrointestinal: Positive for nausea, abdominal pain, constipation (has not had bowel movement in 1 week), abdominal  distention and anorexia. Negative for vomiting, diarrhea, melena, hematochezia and hematemesis.  Genitourinary: Negative for dysuria, hematuria, flank pain, vaginal bleeding, vaginal discharge, difficulty urinating, vaginal pain and pelvic pain.  Musculoskeletal: Negative for myalgias and back pain.  Skin: Negative for rash and wound.  Neurological: Negative for dizziness, light-headedness and headaches.      Allergies  Codeine and Tetanus toxoids  Home Medications   Prior to Admission medications    Medication Sig Start Date End Date Taking? Authorizing Provider  aspirin EC 81 MG tablet Take 81 mg by mouth daily.    Historical Provider, MD  atorvastatin (LIPITOR) 80 MG tablet Take 1 tablet (80 mg total) by mouth daily. 11/09/12   Susa Griffins, MD  benazepril (LOTENSIN) 10 MG tablet Take 10 mg by mouth daily.    Historical Provider, MD  carvedilol (COREG) 12.5 MG tablet Take 12.5 mg by mouth 2 (two) times daily with a meal.    Historical Provider, MD  cholecalciferol (VITAMIN D) 1000 UNITS tablet Take 1,000 Units by mouth daily.    Historical Provider, MD  HYDROcodone-acetaminophen (NORCO/VICODIN) 5-325 MG per tablet Take 1/2-1 tablets every 6 hours as needed for severe pain 04/29/14   Renne Crigler, PA-C  Influenza vac split quadrivalent PF (FLUARIX) 0.5 ML injection Inject 0.5 mLs into the muscle once.    Historical Provider, MD  pneumococcal 13-valent conjugate vaccine (PREVNAR 13) SUSP injection Inject 0.5 mLs into the muscle once.    Historical Provider, MD   BP 105/80 mmHg  Pulse 89  Temp(Src) 98.5 F (36.9 C) (Oral)  Resp 16  SpO2 98% Physical Exam  Constitutional: She does not appear ill. No distress.  HENT:  Head: Head is without abrasion, without contusion and without laceration.  Eyes: Conjunctivae and EOM are normal. Pupils are equal, round, and reactive to light.  Cardiovascular: Normal rate and regular rhythm.  Exam reveals no decreased pulses.   Pulmonary/Chest: Effort normal and breath sounds normal. No tachypnea.  Abdominal: Normal appearance. She exhibits distension. There is no tenderness. There is no rigidity, no guarding, no CVA tenderness, no tenderness at McBurney's point and negative Murphy's sign. No hernia.  Neurological: She is alert. No cranial nerve deficit or sensory deficit. GCS eye subscore is 4. GCS verbal subscore is 5. GCS motor subscore is 6.  Skin: No rash noted.  Psychiatric: She has a normal mood and affect. Her speech is normal and behavior  is normal.    ED Course  Procedures (including critical care time) Labs Review Labs Reviewed  CBC - Abnormal; Notable for the following:    WBC 16.4 (*)    Platelets 419 (*)    All other components within normal limits  LIPASE, BLOOD  COMPREHENSIVE METABOLIC PANEL  URINALYSIS, ROUTINE W REFLEX MICROSCOPIC (NOT AT [facility])    Imaging Review No results found. I have personally reviewed and evaluated these images and lab results as part of my medical decision-making.   EKG Interpretation None      MDM   Final diagnoses:  None    81 year old Caucasian female with past medical history of CAD presents in the setting of abnormal CT scan. Per patient and daughter patient has had decreased appetite and fatigue over the last 2 weeks. Patient has not had a bowel movement in approximately one week. For this she went to her primary care doctor today who sent her for CT scan of her abdomen. CT scan revealed sigmoid diverticulitis with left pelvic sidewall potential abscess. Potential fistulous  connection between the sigmoid diverticulitis and the uterus suggesting pyometra. In setting these findings patient was advised to come to the emergency department for further evaluation.  On arrival patient was hemodynamically stable and complained of mild abdominal distention. She additionally complained of generalized fatigue.  She denied fever, chest pain, shortness of breath, vaginal bleeding, vaginal discharge, blood in her stool.  CBC revealed leukocytosis without anemia. Additionally metabolic panel revealed signs of dehydration. Patient given 1 L normal saline bolus. In setting these findings surgery team was consult for further recommendations.  Surgery has seen and evaluated patient. Please see their note for full details at this time. They do not believe patient will require surgical intervention for abscess likely secondary to diverticulitis. They believe with patient's comorbidities and likely  need for interventional radiology drain patient would be better served on medicine service.  In setting these recommendations medicine was consulted.  Patient will be admitted to medicine and was given Zosyn. Patient stable at time of admission to medicine.  Attending has seen and evaluated patient. Dr. Jeraldine Loots is in agreement with plan.   Stacy Gardner, MD 05/27/15 0981  Gerhard Munch, MD 05/27/15 [phone]

## 2015-05-26 NOTE — ED Notes (Signed)
Patient undressed, in gown, on continuous pulse oximetry and blood pressure cuff; visitor at bedside 

## 2015-05-27 ENCOUNTER — Encounter (HOSPITAL_COMMUNITY): Payer: Self-pay | Admitting: General Practice

## 2015-05-27 ENCOUNTER — Inpatient Hospital Stay (HOSPITAL_COMMUNITY): Payer: Medicare Other

## 2015-05-27 DIAGNOSIS — E785 Hyperlipidemia, unspecified: Secondary | ICD-10-CM

## 2015-05-27 DIAGNOSIS — R740 Nonspecific elevation of levels of transaminase and lactic acid dehydrogenase [LDH]: Secondary | ICD-10-CM

## 2015-05-27 DIAGNOSIS — I10 Essential (primary) hypertension: Secondary | ICD-10-CM

## 2015-05-27 DIAGNOSIS — K5720 Diverticulitis of large intestine with perforation and abscess without bleeding: Principal | ICD-10-CM

## 2015-05-27 LAB — BASIC METABOLIC PANEL
Anion gap: 12 (ref 5–15)
BUN: 19 mg/dL (ref 6–20)
CALCIUM: 8.3 mg/dL — AB (ref 8.9–10.3)
CO2: 23 mmol/L (ref 22–32)
CREATININE: 0.85 mg/dL (ref 0.44–1.00)
Chloride: 99 mmol/L — ABNORMAL LOW (ref 101–111)
GFR calc non Af Amer: >60 mL/min (ref >60)
Glucose, Bld: 82 mg/dL (ref 65–99)
Potassium: 3.1 mmol/L — ABNORMAL LOW (ref 3.5–5.1)
SODIUM: 134 mmol/L — AB (ref 135–145)

## 2015-05-27 LAB — CBC
HCT: 31.9 % — ABNORMAL LOW (ref 36.0–46.0)
Hemoglobin: 10.9 g/dL — ABNORMAL LOW (ref 12.0–15.0)
MCH: 31.3 pg (ref 26.0–34.0)
MCHC: 34.2 g/dL (ref 30.0–36.0)
MCV: 91.7 fL (ref 78.0–100.0)
PLATELETS: 345 10*3/uL (ref 150–400)
RBC: 3.48 MIL/uL — AB (ref 3.87–5.11)
RDW: 14.3 % (ref 11.5–15.5)
WBC: 16.1 10*3/uL — AB (ref 4.0–10.5)

## 2015-05-27 LAB — PROTIME-INR
INR: 1.34 (ref 0.00–1.49)
PROTHROMBIN TIME: 16.7 s — AB (ref 11.6–15.2)

## 2015-05-27 MED ORDER — FENTANYL CITRATE (PF) 100 MCG/2ML IJ SOLN
INTRAMUSCULAR | Status: AC
  Filled 2015-05-27: qty 2

## 2015-05-27 MED ORDER — ENOXAPARIN SODIUM 40 MG/0.4ML ~~LOC~~ SOLN
40.0000 mg | SUBCUTANEOUS | Status: DC
  Administered 2015-05-28 – 2015-06-01 (×5): 40 mg via SUBCUTANEOUS
  Filled 2015-05-27 (×5): qty 0.4

## 2015-05-27 MED ORDER — POTASSIUM CHLORIDE 10 MEQ/100ML IV SOLN
10.0000 meq | INTRAVENOUS | Status: AC
  Administered 2015-05-27 (×2): 10 meq via INTRAVENOUS
  Filled 2015-05-27: qty 100

## 2015-05-27 MED ORDER — MIDAZOLAM HCL 2 MG/2ML IJ SOLN
INTRAMUSCULAR | Status: AC
  Filled 2015-05-27: qty 2

## 2015-05-27 MED ORDER — FENTANYL CITRATE (PF) 100 MCG/2ML IJ SOLN
INTRAMUSCULAR | Status: AC | PRN
  Administered 2015-05-27 (×2): 50 ug via INTRAVENOUS

## 2015-05-27 MED ORDER — MIDAZOLAM HCL 2 MG/2ML IJ SOLN
INTRAMUSCULAR | Status: AC | PRN
  Administered 2015-05-27 (×2): 1 mg via INTRAVENOUS

## 2015-05-27 NOTE — Sedation Documentation (Signed)
Patient denies pain and is resting comfortably.  

## 2015-05-27 NOTE — Procedures (Signed)
LLQ abscess drain 12 Fr Yielded frank pus No comp/EBL

## 2015-05-27 NOTE — Progress Notes (Signed)
PATIENT DETAILS Name: Rita Harding Age: 81 y.o. Sex: female Date of Birth: January 12, 1934 Admit Date: 05/26/2015 Admitting Physician Ron Parker, MD [id number] [name], MD  Subjective: Abd pain much better  Assessment/Plan: Principal Problem: Colonic diverticular abscess with systemic inflammatory response syndrome: Continue empiric Zosyn, seen by interventional radiology-drain placed. Keep nothing by mouth for now, will slowly advance diet. Abdominal pain has improved, abdomen is soft-leukocytosis is down trending.  Active Problems: Mildly elevated transaminitis: Likely secondary to above, trend-hold off on further workup at this time  Hypertension: Blood pressure currently controlled without the use of any antihypertensives. Follow  History of CAD: Stable without any chest pain or shortness of breath. Resume antiplatelets when oral intake is more stable.  Dyslipidemia: Resume statins were noted because most stable  History of Takotsubo cardiomyopathy: Per last outpatient cardiology note-this seems to have resolved. Euvolemic on exam, watch closely while on IV fluids  Disposition: Remain inpatient  Antimicrobial agents  See below  Anti-infectives    Start     Dose/Rate Route Frequency Ordered Stop   05/27/15 0400  piperacillin-tazobactam (ZOSYN) IVPB 3.375 g     3.375 g 12.5 mL/hr over 240 Minutes Intravenous Every 8 hours 05/26/15 2043     05/26/15 2200  piperacillin-tazobactam (ZOSYN) IVPB 3.375 g  Status:  Discontinued     3.375 g 100 mL/hr over 30 Minutes Intravenous 3 times per day 05/26/15 2003 05/26/15 2042   05/26/15 1945  piperacillin-tazobactam (ZOSYN) IVPB 3.375 g     3.375 g 100 mL/hr over 30 Minutes Intravenous  Once 05/26/15 1936 05/26/15 2046      DVT Prophylaxis: Prophylactic Lovenox  Code Status: Full code   Family Communication None at bedside  Procedures: LLQ Drain placement 12/17 by IR  CONSULTS:  general  surgery  Time spent 30 minutes-Greater than 50% of this time was spent in counseling, explanation of diagnosis, planning of further management, and coordination of care.  MEDICATIONS: Scheduled Meds: . fentaNYL      . midazolam      . piperacillin-tazobactam (ZOSYN)  IV  3.375 g Intravenous Q8H  . sodium chloride  3 mL Intravenous Q12H   Continuous Infusions: . sodium chloride 75 mL/hr at 05/27/15 0539   PRN Meds:.acetaminophen **OR** acetaminophen, alum & mag hydroxide-simeth, HYDROmorphone (DILAUDID) injection, ondansetron **OR** ondansetron (ZOFRAN) IV    PHYSICAL EXAM: Vital signs in last 24 hours: Filed Vitals:   05/27/15 1224 05/27/15 1242 05/27/15 1348 05/27/15 1432  BP: 121/67 128/75 128/69 140/67  Pulse: 87 61 79 82  Temp:   99.3 F (37.4 C) 98.5 F (36.9 C)  TempSrc:   Oral Oral  Resp: Height:      Weight:      SpO2: 97% 96% 97% 98%    Weight change:  Filed Weights   05/26/15 2226  Weight: 60.737 kg (133 lb 14.4 oz)   Body mass index is 22.64 kg/(m^2).   Gen Exam: Awake and alert with clear speech.   Neck: Supple, No JVD.   Chest: B/L Clear.   CVS: S1 S2 Regular, no murmurs.  Abdomen: soft, BS +, non tender, non distended.  Extremities: no edema, lower extremities warm to touch. Neurologic: Non Focal.   Skin: No Rash.   Wounds: N/A.    Intake/Output from previous day:  Intake/Output Summary (Last 24 hours) at 05/27/15 1449 Last data filed at 05/27/15  1440  Gross per 24 hour  Intake      0 ml  Output    220 ml  Net   -220 ml     LAB RESULTS: CBC  Recent Labs Lab 05/26/15 1701 05/27/15 0555  WBC 16.4* 16.1*  HGB 13.0 10.9*  HCT 37.9 31.9*  PLT 419* 345  MCV 91.3 91.7  MCH 31.3 31.3  MCHC 34.3 34.2  RDW 14.0 14.3    Chemistries   Recent Labs Lab 05/26/15 1701 05/27/15 0555  NA 129* 134*  K 3.3* 3.1*  CL 92* 99*  CO2 24 23  GLUCOSE 118* 82  BUN 22* 19  CREATININE 0.96 0.85  CALCIUM 8.8* 8.3*    CBG: No  results for input(s): GLUCAP in the last 168 hours.  GFR Estimated Creatinine Clearance: 47.4 mL/min (by C-G formula based on Cr of 0.85).  Coagulation profile  Recent Labs Lab 05/27/15 0555  INR 1.34    Cardiac Enzymes No results for input(s): CKMB, TROPONINI, MYOGLOBIN in the last 168 hours.  Invalid input(s): CK  Invalid input(s): POCBNP No results for input(s): DDIMER in the last 72 hours. No results for input(s): HGBA1C in the last 72 hours. No results for input(s): CHOL, HDL, LDLCALC, TRIG, CHOLHDL, LDLDIRECT in the last 72 hours. No results for input(s): TSH, T4TOTAL, T3FREE, THYROIDAB in the last 72 hours.  Invalid input(s): FREET3 No results for input(s): VITAMINB12, FOLATE, FERRITIN, TIBC, IRON, RETICCTPCT in the last 72 hours.  Recent Labs  05/26/15 1701  LIPASE 29    Urine Studies No results for input(s): UHGB, CRYS in the last 72 hours.  Invalid input(s): UACOL, UAPR, USPG, UPH, UTP, UGL, UKET, UBIL, UNIT, UROB, ULEU, UEPI, UWBC, URBC, UBAC, CAST, UCOM, BILUA  MICROBIOLOGY: No results found for this or any previous visit (from the past 240 hour(s)).  RADIOLOGY STUDIES/RESULTS: Ct Image Guided Drainage By Percutaneous Catheter  05/27/2015  CLINICAL DATA:  Left abdominal abscess EXAM: CT IMAGE GUIDED DRAINAGE BY PERCUTANEOUS CATHETER FLUOROSCOPY TIME:  None MEDICATIONS AND MEDICAL HISTORY: Versed 2 mg, Fentanyl 100 mcg. Additional Medications: None. ANESTHESIA/SEDATION: Moderate sedation time: 12 minutes CONTRAST:  None PROCEDURE: The procedure, risks, benefits, and alternatives were explained to the patient. Questions regarding the procedure were encouraged and answered. The patient understands and consents to the procedure. The lower abdomen was prepped with Betadine in a sterile fashion, and a sterile drape was applied covering the operative field. A sterile gown and sterile gloves were used for the procedure. Under CT guidance, an 18 gauge needle was  inserted into the lower abdominal abscess and removed over an Amplatz wire. A 12 French dilator followed by a 12 [place] drain were inserted. It was looped and string fixed and sewn to the skin. Pus was aspirated. FINDINGS: Images document 7 French drain placement into a lower abdominal abscess. COMPLICATIONS: None IMPRESSION: Successful lower abdominal abscess drainage. Electronically Signed   By: Jolaine Click M.D.   On: 05/27/2015 14:09    Jeoffrey Massed, MD  [facility] Pager:[phone]  If 7PM-7AM, please contact night-coverage [email or web address] Password [email or web address] 05/27/2015, 2:49 PM   LOS: 1 day

## 2015-05-27 NOTE — Progress Notes (Signed)
Patient ID: Rita Harding, female   DOB: 08/25/1933, 81 y.o.   MRN: [id number] [facility] Progress Note:   * No surgery found *  Subjective: Mental status is clear.  Has had pain for a couple of weeks Objective: Vital signs in last 24 hours: Temp:  [98.4 F (36.9 C)-98.9 F (37.2 C)] 98.7 F (37.1 C) (12/17 0637) Pulse Rate:  [37-106] 74 (12/17 0637) Resp:  [16-28] 16 (12/17 0637) BP: (105-126)/(53-83) 118/81 mmHg (12/17 0637) SpO2:  [95 %-98 %] 97 % (12/17 0637) Weight:  [60.737 kg (133 lb 14.4 oz)] 60.737 kg (133 lb 14.4 oz) (12/16 2226)  Intake/Output from previous day:   Intake/Output this shift:    Physical Exam: Work of breathing is not labored.  No acute abdomen  Lab Results:  Results for orders placed or performed during the hospital encounter of 05/26/15 (from the past 48 hour(s))  Lipase, blood     Status: None   Collection Time: 05/26/15  5:01 PM  Result Value Ref Range   Lipase 29 11 - 51 U/L  Comprehensive metabolic panel     Status: Abnormal   Collection Time: 05/26/15  5:01 PM  Result Value Ref Range   Sodium 129 (L) 135 - 145 mmol/L   Potassium 3.3 (L) 3.5 - 5.1 mmol/L   Chloride 92 (L) 101 - 111 mmol/L   CO2 24 22 - 32 mmol/L   Glucose, Bld 118 (H) 65 - 99 mg/dL   BUN 22 (H) 6 - 20 mg/dL   Creatinine, Ser 0.96 0.44 - 1.00 mg/dL   Calcium 8.8 (L) 8.9 - 10.3 mg/dL   Total Protein 7.4 6.5 - 8.1 g/dL   Albumin 2.6 (L) 3.5 - 5.0 g/dL   AST 91 (H) 15 - 41 U/L   ALT 61 (H) 14 - 54 U/L   Alkaline Phosphatase 123 38 - 126 U/L   Total Bilirubin 1.9 (H) 0.3 - 1.2 mg/dL   GFR calc non Af Amer 55 (L) >60 mL/min   GFR calc Af Amer >60 >60 mL/min    Comment: (NOTE) The eGFR has been calculated using the CKD EPI equation. This calculation has not been validated in all clinical situations. eGFR's persistently <60 mL/min signify possible Chronic Kidney Disease.    Anion gap 13 5 - 15  CBC     Status: Abnormal   Collection Time: 05/26/15  5:01 PM   Result Value Ref Range   WBC 16.4 (H) 4.0 - 10.5 K/uL   RBC 4.15 3.87 - 5.11 MIL/uL   Hemoglobin 13.0 12.0 - 15.0 g/dL   HCT 37.9 36.0 - 46.0 %   MCV 91.3 78.0 - 100.0 fL   MCH 31.3 26.0 - 34.0 pg   MCHC 34.3 30.0 - 36.0 g/dL   RDW 14.0 11.5 - 15.5 %   Platelets 419 (H) 150 - 400 K/uL  Basic metabolic panel     Status: Abnormal   Collection Time: 05/27/15  5:55 AM  Result Value Ref Range   Sodium 134 (L) 135 - 145 mmol/L   Potassium 3.1 (L) 3.5 - 5.1 mmol/L   Chloride 99 (L) 101 - 111 mmol/L   CO2 23 22 - 32 mmol/L   Glucose, Bld 82 65 - 99 mg/dL   BUN 19 6 - 20 mg/dL   Creatinine, Ser 0.85 0.44 - 1.00 mg/dL   Calcium 8.3 (L) 8.9 - 10.3 mg/dL   GFR calc non Af Amer >60 >60 mL/min   GFR  calc Af Amer >60 >60 mL/min    Comment: (NOTE) The eGFR has been calculated using the CKD EPI equation. This calculation has not been validated in all clinical situations. eGFR's persistently <60 mL/min signify possible Chronic Kidney Disease.    Anion gap 12 5 - 15  CBC     Status: Abnormal   Collection Time: 05/27/15  5:55 AM  Result Value Ref Range   WBC 16.1 (H) 4.0 - 10.5 K/uL   RBC 3.48 (L) 3.87 - 5.11 MIL/uL   Hemoglobin 10.9 (L) 12.0 - 15.0 g/dL   HCT 31.9 (L) 36.0 - 46.0 %   MCV 91.7 78.0 - 100.0 fL   MCH 31.3 26.0 - 34.0 pg   MCHC 34.2 30.0 - 36.0 g/dL   RDW 14.3 11.5 - 15.5 %   Platelets 345 150 - 400 K/uL  Protime-INR     Status: Abnormal   Collection Time: 05/27/15  5:55 AM  Result Value Ref Range   Prothrombin Time 16.7 (H) 11.6 - 15.2 seconds   INR 1.34 0.00 - 1.49    Radiology/Results: No results found.  Anti-infectives: Anti-infectives    Start     Dose/Rate Route Frequency Ordered Stop   05/27/15 0400  piperacillin-tazobactam (ZOSYN) IVPB 3.375 g     3.375 g 12.5 mL/hr over 240 Minutes Intravenous Every 8 hours 05/26/15 2043     05/26/15 2200  piperacillin-tazobactam (ZOSYN) IVPB 3.375 g  Status:  Discontinued     3.375 g 100 mL/hr over 30 Minutes  Intravenous 3 times per day 05/26/15 2003 05/26/15 2042   05/26/15 1945  piperacillin-tazobactam (ZOSYN) IVPB 3.375 g     3.375 g 100 mL/hr over 30 Minutes Intravenous  Once 05/26/15 1936 05/26/15 2046      Assessment/Plan: Problem List: Patient Active Problem List   Diagnosis Date Noted  . Colonic diverticular abscess 05/26/2015  . Intestinal diverticular abscess 05/26/2015  . Leukocytosis 05/26/2015  . Abdominal pain 05/26/2015  . Nausea and vomiting 05/26/2015  . Elevated transaminase level 05/26/2015  . Hypoalbuminemia 05/26/2015  . Diverticula of colon   . Coronary artery disease   . Hypertension   . Takotsubo cardiomyopathy   . Hyperlipidemia     Awaiting percutaneous drainage of abscess * No surgery found *    LOS: 1 day   Matt B. Hassell Done, MD, [facility] [phone] beeper [phone]  05/27/2015 10:49 AM

## 2015-05-27 NOTE — H&P (Signed)
Chief Complaint: Patient was seen in consultation today for diverticular abscess  Chief Complaint  Patient presents with  . Abdominal Pain   at the request of [facility] Dr. Jerral Ralph  Referring Physician(s): [facility] Dr. Jerral Ralph  History of Present Illness: Rita Harding is a 81 y.o. female who presented on 05/26/15 with c/o abdominal pain, constipation, fatigue, anorexia x 2 weeks. PCP outside facility CT done revealed large abscess consistent with perforated diverticulitis, the patient was sent to the ED labs reveal leukocytosis. [facility] has seen the patient and requested for IR consult and drain placement. The patient denies any current N/V. She denies any fever or chills. She denies any chest pain, shortness of breath or palpitations. She denies any active signs of bleeding or excessive bruising. The patient denies any history of sleep apnea or chronic oxygen use. She has previously tolerated sedation without complications.    Past Medical History  Diagnosis Date  . Hypertension   . Hyperlipidemia   . MI (myocardial infarction) (HCC) 2007    Thought to be takotsubo cardiomyopathy  . Coronary artery disease 2007    non-critcal coronary single disease  . History of DVT (deep vein thrombosis)     "LLE"; BEEN OFF  COUMADIN SINCE Sept 2012  . Takotsubo cardiomyopathy 2007    Result with normal EF as OF 2013    Past Surgical History  Procedure Laterality Date  . Appendectomy    . Tonsillectomy    . Nm myoview ltd  02/21/2012    low risk  and normal EF 70% or greater  . Transthoracic echocardiogram  07/31/2010    no MVP ,NORMAL SYSTOLIC FUNCTION   . Doppler left lower extremity ([facility]) Left 01/29/2011    left saphenous vein -small amt of chroinc appearing calicification on anterior wall that was nonobstructing,mildly abnormal   . Doppler right additional extr ([facility] hx) Right 01/29/2011  . Fibula fracture surgery Left 2009    UNDER CARE DR Beverely Low  . Fracture surgery    .  Cardiac catheterization  01/03/2006     50-60% LAD with some myocardial  bridging; NON ISCHEMIC  CARDIOMYOPATHY  with EF  30 to 35% no evidence of infarct -- anteroapical and inferapical wall motin abnormalities compatilble with Takotsubo-type syndrome    Allergies: Codeine and Tetanus toxoids  Medications: Prior to Admission medications   Medication Sig Start Date End Date Taking? Authorizing Provider  aspirin EC 81 MG tablet Take 81 mg by mouth daily.   Yes Historical Provider, MD  atorvastatin (LIPITOR) 80 MG tablet Take 1 tablet (80 mg total) by mouth daily. 11/09/12  Yes Susa Griffins, MD  benazepril (LOTENSIN) 10 MG tablet Take 10 mg by mouth daily.   Yes Historical Provider, MD  carvedilol (COREG) 12.5 MG tablet Take 12.5 mg by mouth 2 (two) times daily with a meal.   Yes Historical Provider, MD  cholecalciferol (VITAMIN D) 1000 UNITS tablet Take 1,000 Units by mouth daily.   Yes Historical Provider, MD  HYDROcodone-acetaminophen (NORCO/VICODIN) 5-325 MG per tablet Take 1/2-1 tablets every 6 hours as needed for severe pain 04/29/14   Renne Crigler, PA-C  Influenza vac split quadrivalent PF (FLUARIX) 0.5 ML injection Inject 0.5 mLs into the muscle once.    Historical Provider, MD  pneumococcal 13-valent conjugate vaccine (PREVNAR 13) SUSP injection Inject 0.5 mLs into the muscle once.    Historical Provider, MD     Family History  Problem Relation Age of Onset  .  Cancer Mother   . Heart attack Father   . Cancer Maternal Grandmother   . Heart disease Paternal Grandmother   . Heart disease Paternal Grandfather     Social History   Social History  . Marital Status: Married    Spouse Name: N/A  . Number of Children: N/A  . Years of Education: N/A   Social History Main Topics  . Smoking status: Former Smoker -- 1.00 packs/day for 15 years    Types: Cigarettes  . Smokeless tobacco: Never Used     Comment: "quit smoking in the 1960s"  . Alcohol Use: Yes     Comment:  05/27/2015 "nothing in 1 month; I drank a couple glasses of wine/day before that"  . Drug Use: No  . Sexual Activity: No   Other Topics Concern  . None   Social History Narrative   Married mother of 3 with 5 grandchildren. Quit smoking over 30 years ago.   Goes with her husband, who is a former major Careers information officer, and also in a patient of mine with progression of dementia. 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   She continues to be active, but is no longer walking as frequent as she used to. She still does work around the yard and house.   Review of Systems: A 12 point ROS discussed and pertinent positives are indicated in the HPI above.  All other systems are negative.  Review of Systems  Vital Signs: BP 118/81 mmHg  Pulse 74  Temp(Src) 98.7 F (37.1 C) (Oral)  Resp 16  Ht 5' 4.5" (1.638 m)  Wt 133 lb 14.4 oz (60.737 kg)  BMI 22.64 kg/m2  SpO2 97%  Physical Exam  Constitutional: She is oriented to person, place, and time. No distress.  HENT:  Head: Normocephalic and atraumatic.  Cardiovascular: Normal rate and regular rhythm.  Exam reveals no gallop and no friction rub.   No murmur heard. Pulmonary/Chest: Effort normal and breath sounds normal. No respiratory distress. She has no wheezes. She has no rales.  Abdominal: Soft. She exhibits distension. There is tenderness.  Neurological: She is alert and oriented to person, place, and time.  Skin: Skin is warm and dry. She is not diaphoretic.    Mallampati Score:  MD Evaluation Airway: WNL Heart: WNL Abdomen: WNL Chest/ Lungs: WNL ASA  Classification: 3 Mallampati/Airway Score: Two  Imaging: No results found.  Labs:  CBC:  Recent Labs  05/26/15 1701 05/27/15 0555  WBC 16.4* 16.1*  HGB 13.0 10.9*  HCT 37.9 31.9*  PLT 419* 345    COAGS:  Recent Labs  05/27/15 0555  INR 1.34    BMP:  Recent Labs  05/26/15 1701 05/27/15 0555  NA 129* 134*  K 3.3* 3.1*    CL 92* 99*  CO2 24 23  GLUCOSE 118* 82  BUN 22* 19  CALCIUM 8.8* 8.3*  CREATININE 0.96 0.85  GFRNONAA 55* >60  GFRAA >60 >60    LIVER FUNCTION TESTS:  Recent Labs  05/26/15 1701  BILITOT 1.9*  AST 91*  ALT 61*  ALKPHOS 123  PROT 7.4  ALBUMIN 2.6*    Assessment and Plan: Fatigue Anorexia x 2 weeks Mild abdominal pain Outside facility CT done revealed large abscess consistent with perforated diverticulitis, Leukocytosis on IV Zosyn   [facility] has seen  Request for IR consult and drain placement The patient has been NPO, no blood thinners taken, labs and vitals have been reviewed. Risks  and Benefits discussed with the patient including bleeding, infection, damage to adjacent structures, bowel perforation/fistula connection, and sepsis. All of the patient's questions were answered, patient is agreeable to proceed. Consent signed and in chart. History of MI History of DVT, no longer on anticoagulants    Thank you for this interesting consult.  I greatly enjoyed meeting Rita Harding and look forward to participating in their care.  A copy of this report was sent to the requesting provider on this date.  SignedBerneta Levins 05/27/2015, 9:39 AM   I spent a total of 20 Minutes in face to face in clinical consultation, greater than 50% of which was counseling/coordinating care for diverticular abscess.

## 2015-05-28 LAB — COMPREHENSIVE METABOLIC PANEL
ALBUMIN: 1.7 g/dL — AB (ref 3.5–5.0)
ALT: 38 U/L (ref 14–54)
ANION GAP: 9 (ref 5–15)
AST: 40 U/L (ref 15–41)
Alkaline Phosphatase: 90 U/L (ref 38–126)
BUN: 13 mg/dL (ref 6–20)
CHLORIDE: 103 mmol/L (ref 101–111)
CO2: 24 mmol/L (ref 22–32)
Calcium: 8.2 mg/dL — ABNORMAL LOW (ref 8.9–10.3)
Creatinine, Ser: 0.69 mg/dL (ref 0.44–1.00)
GFR calc Af Amer: >60 mL/min (ref >60)
GFR calc non Af Amer: >60 mL/min (ref >60)
GLUCOSE: 92 mg/dL (ref 65–99)
POTASSIUM: 3.1 mmol/L — AB (ref 3.5–5.1)
SODIUM: 136 mmol/L (ref 135–145)
Total Bilirubin: 1.2 mg/dL (ref 0.3–1.2)
Total Protein: 5.4 g/dL — ABNORMAL LOW (ref 6.5–8.1)

## 2015-05-28 LAB — CBC
HEMATOCRIT: 31.4 % — AB (ref 36.0–46.0)
HEMOGLOBIN: 10.5 g/dL — AB (ref 12.0–15.0)
MCH: 31 pg (ref 26.0–34.0)
MCHC: 33.4 g/dL (ref 30.0–36.0)
MCV: 92.6 fL (ref 78.0–100.0)
Platelets: 327 10*3/uL (ref 150–400)
RBC: 3.39 MIL/uL — ABNORMAL LOW (ref 3.87–5.11)
RDW: 14.5 % (ref 11.5–15.5)
WBC: 11.6 10*3/uL — AB (ref 4.0–10.5)

## 2015-05-28 MED ORDER — POTASSIUM CHLORIDE CRYS ER 20 MEQ PO TBCR
40.0000 meq | EXTENDED_RELEASE_TABLET | Freq: Every day | ORAL | Status: DC
  Administered 2015-05-28 – 2015-06-01 (×5): 40 meq via ORAL
  Filled 2015-05-28 (×5): qty 2

## 2015-05-28 MED ORDER — SODIUM CHLORIDE 0.9 % IV SOLN
INTRAVENOUS | Status: DC
  Administered 2015-05-28 – 2015-05-31 (×2): via INTRAVENOUS
  Filled 2015-05-28 (×11): qty 1000

## 2015-05-28 NOTE — Progress Notes (Signed)
PATIENT DETAILS Name: Rita Harding Age: 81 y.o. Sex: female Date of Birth: 09-15-1933 Admit Date: 05/26/2015 Admitting Physician Ron Parker, MD [id number] [name], MD  Subjective: Abd pain much better-frank pus draining from left lower quadrant drain.  Assessment/Plan: Principal Problem: Colonic diverticular abscess with systemic inflammatory response syndrome: Improved with empiric Zosyn and placement of left lower quadrant drain. Continue nothing by mouth for now, will slowly advance diet.  Cytosis significantly improved  Active Problems: Mildly elevated transaminitis: Resolved, Likely secondary to above  Hypertension: Blood pressure currently controlled without the use of any antihypertensives. Follow  History of CAD: Stable without any chest pain or shortness of breath. Resume antiplatelets when oral intake is more stable.  Dyslipidemia: Resume statins were noted because most stable  History of Takotsubo cardiomyopathy: Per last outpatient cardiology note-this seems to have resolved. Euvolemic on exam, watch closely while on IV fluids  Disposition: Remain inpatient-home when able to tolerate diet.  Antimicrobial agents  See below  Anti-infectives    Start     Dose/Rate Route Frequency Ordered Stop   05/27/15 0400  piperacillin-tazobactam (ZOSYN) IVPB 3.375 g     3.375 g 12.5 mL/hr over 240 Minutes Intravenous Every 8 hours 05/26/15 2043     05/26/15 2200  piperacillin-tazobactam (ZOSYN) IVPB 3.375 g  Status:  Discontinued     3.375 g 100 mL/hr over 30 Minutes Intravenous 3 times per day 05/26/15 2003 05/26/15 2042   05/26/15 1945  piperacillin-tazobactam (ZOSYN) IVPB 3.375 g     3.375 g 100 mL/hr over 30 Minutes Intravenous  Once 05/26/15 1936 05/26/15 2046      DVT Prophylaxis: Prophylactic Lovenox  Code Status: Full code   Family Communication None at bedside  Procedures: LLQ Drain placement 12/17 by IR  CONSULTS:  general surgery  Time spent 30 minutes-Greater than 50% of this time was spent in counseling, explanation of diagnosis, planning of further management, and coordination of care.  MEDICATIONS: Scheduled Meds: . enoxaparin (LOVENOX) injection  40 mg Subcutaneous Q24H  . piperacillin-tazobactam (ZOSYN)  IV  3.375 g Intravenous Q8H  . potassium chloride  40 mEq Oral Daily  . sodium chloride  3 mL Intravenous Q12H   Continuous Infusions: . sodium chloride 0.9 % 1,000 mL with potassium chloride 20 mEq infusion 75 mL/hr at 05/28/15 0822   PRN Meds:.acetaminophen **OR** acetaminophen, alum & mag hydroxide-simeth, HYDROmorphone (DILAUDID) injection, ondansetron **OR** ondansetron (ZOFRAN) IV    PHYSICAL EXAM: Vital signs in last 24 hours: Filed Vitals:   05/27/15 1552 05/28/15 0043 05/28/15 0415 05/28/15 1315  BP: 126/59 120/64 113/63 120/58  Pulse: 80 81 78 93  Temp:  97.8 F (36.6 C) 98.2 F (36.8 C) 98.1 F (36.7 C)  TempSrc:  Oral Oral Oral  Resp:  18 18 18   Height:      Weight:      SpO2:  95% 95% 94%    Weight change:  Filed Weights   05/26/15 2226  Weight: 60.737 kg (133 lb 14.4 oz)   Body mass index is 22.64 kg/(m^2).   Gen Exam: Awake and alert with clear speech.   Neck: Supple, No JVD.   Chest: B/L Clear.No  Rales CVS: S1 S2 Regular, no murmurs.  Abdomen: soft, BS +, non tender, non distended. Left lower quadrant drain in place Extremities: no edema, lower extremities warm to touch. Neurologic: Non Focal.   Skin: No Rash.  Wounds: N/A.    Intake/Output from previous day:  Intake/Output Summary (Last 24 hours) at 05/28/15 1409 Last data filed at 05/28/15 1221  Gross per 24 hour  Intake   1405 ml  Output    335 ml  Net   1070 ml     LAB RESULTS: CBC  Recent Labs Lab 05/26/15 1701 05/27/15 0555 05/28/15 0533  WBC 16.4* 16.1* 11.6*  HGB 13.0 10.9* 10.5*  HCT 37.9 31.9* 31.4*  PLT 419* 345 327  MCV 91.3 91.7 92.6  MCH 31.3 31.3 31.0  MCHC  34.3 34.2 33.4  RDW 14.0 14.3 14.5    Chemistries   Recent Labs Lab 05/26/15 1701 05/27/15 0555 05/28/15 0533  NA 129* 134* 136  K 3.3* 3.1* 3.1*  CL 92* 99* 103  CO2 GLUCOSE 118* 82 92  BUN 22* 19 13  CREATININE 0.96 0.85 0.69  CALCIUM 8.8* 8.3* 8.2*    CBG: No results for input(s): GLUCAP in the last 168 hours.  GFR Estimated Creatinine Clearance: 50.3 mL/min (by C-G formula based on Cr of 0.69).  Coagulation profile  Recent Labs Lab 05/27/15 0555  INR 1.34    Cardiac Enzymes No results for input(s): CKMB, TROPONINI, MYOGLOBIN in the last 168 hours.  Invalid input(s): CK  Invalid input(s): POCBNP No results for input(s): DDIMER in the last 72 hours. No results for input(s): HGBA1C in the last 72 hours. No results for input(s): CHOL, HDL, LDLCALC, TRIG, CHOLHDL, LDLDIRECT in the last 72 hours. No results for input(s): TSH, T4TOTAL, T3FREE, THYROIDAB in the last 72 hours.  Invalid input(s): FREET3 No results for input(s): VITAMINB12, FOLATE, FERRITIN, TIBC, IRON, RETICCTPCT in the last 72 hours.  Recent Labs  05/26/15 1701  LIPASE 29    Urine Studies No results for input(s): UHGB, CRYS in the last 72 hours.  Invalid input(s): UACOL, UAPR, USPG, UPH, UTP, UGL, UKET, UBIL, UNIT, UROB, ULEU, UEPI, UWBC, URBC, UBAC, CAST, UCOM, BILUA  MICROBIOLOGY: No results found for this or any previous visit (from the past 240 hour(s)).  RADIOLOGY STUDIES/RESULTS: Ct Image Guided Drainage By Percutaneous Catheter  05/27/2015  CLINICAL DATA:  Left abdominal abscess EXAM: CT IMAGE GUIDED DRAINAGE BY PERCUTANEOUS CATHETER FLUOROSCOPY TIME:  None MEDICATIONS AND MEDICAL HISTORY: Versed 2 mg, Fentanyl 100 mcg. Additional Medications: None. ANESTHESIA/SEDATION: Moderate sedation time: 12 minutes CONTRAST:  None PROCEDURE: The procedure, risks, benefits, and alternatives were explained to the patient. Questions regarding the procedure were encouraged and answered.  The patient understands and consents to the procedure. The lower abdomen was prepped with Betadine in a sterile fashion, and a sterile drape was applied covering the operative field. A sterile gown and sterile gloves were used for the procedure. Under CT guidance, an 18 gauge needle was inserted into the lower abdominal abscess and removed over an Amplatz wire. A 12 French dilator followed by a 12 [place] drain were inserted. It was looped and string fixed and sewn to the skin. Pus was aspirated. FINDINGS: Images document 74 French drain placement into a lower abdominal abscess. COMPLICATIONS: None IMPRESSION: Successful lower abdominal abscess drainage. Electronically Signed   By: Jolaine Click M.D.   On: 05/27/2015 14:09    Jeoffrey Massed, MD  [facility] Pager:[phone]  If 7PM-7AM, please contact night-coverage [email or web address] Password [email or web address] 05/28/2015, 2:09 PM   LOS: 2 days

## 2015-05-28 NOTE — Progress Notes (Signed)
Referring Physician(s): [facility]  Chief Complaint: Abdominal abscess s/p perc drain 12/17  Subjective: Patient states she is doing well, does c/o tenderness at drain site   Allergies: Codeine and Tetanus toxoids  Medications: Prior to Admission medications   Medication Sig Start Date End Date Taking? Authorizing Provider  aspirin EC 81 MG tablet Take 81 mg by mouth daily.   Yes Historical Provider, MD  atorvastatin (LIPITOR) 80 MG tablet Take 1 tablet (80 mg total) by mouth daily. 11/09/12  Yes Susa Griffins, MD  benazepril (LOTENSIN) 10 MG tablet Take 10 mg by mouth daily.   Yes Historical Provider, MD  carvedilol (COREG) 12.5 MG tablet Take 12.5 mg by mouth 2 (two) times daily with a meal.   Yes Historical Provider, MD  cholecalciferol (VITAMIN D) 1000 UNITS tablet Take 1,000 Units by mouth daily.   Yes Historical Provider, MD  HYDROcodone-acetaminophen (NORCO/VICODIN) 5-325 MG per tablet Take 1/2-1 tablets every 6 hours as needed for severe pain 04/29/14   Renne Crigler, PA-C  Influenza vac split quadrivalent PF (FLUARIX) 0.5 ML injection Inject 0.5 mLs into the muscle once.    Historical Provider, MD  pneumococcal 13-valent conjugate vaccine (PREVNAR 13) SUSP injection Inject 0.5 mLs into the muscle once.    Historical Provider, MD   Vital Signs: BP 113/63 mmHg  Pulse 78  Temp(Src) 98.2 F (36.8 C) (Oral)  Resp 18  Ht 5' 4.5" (1.638 m)  Wt 133 lb 14.4 oz (60.737 kg)  BMI 22.64 kg/m2  SpO2 95%  Physical Exam General: A&Ox3, NAD Abd: Soft, LLQ TTP, LLQ perc drain intact white purulent output, 345 cc/24 hrs  Imaging: Ct Image Guided Drainage By Percutaneous Catheter  05/27/2015  CLINICAL DATA:  Left abdominal abscess EXAM: CT IMAGE GUIDED DRAINAGE BY PERCUTANEOUS CATHETER FLUOROSCOPY TIME:  None MEDICATIONS AND MEDICAL HISTORY: Versed 2 mg, Fentanyl 100 mcg. Additional Medications: None. ANESTHESIA/SEDATION: Moderate sedation time: 12 minutes CONTRAST:  None PROCEDURE:  The procedure, risks, benefits, and alternatives were explained to the patient. Questions regarding the procedure were encouraged and answered. The patient understands and consents to the procedure. The lower abdomen was prepped with Betadine in a sterile fashion, and a sterile drape was applied covering the operative field. A sterile gown and sterile gloves were used for the procedure. Under CT guidance, an 18 gauge needle was inserted into the lower abdominal abscess and removed over an Amplatz wire. A 12 French dilator followed by a 12 [place] drain were inserted. It was looped and string fixed and sewn to the skin. Pus was aspirated. FINDINGS: Images document 27 French drain placement into a lower abdominal abscess. COMPLICATIONS: None IMPRESSION: Successful lower abdominal abscess drainage. Electronically Signed   By: Jolaine Click M.D.   On: 05/27/2015 14:09    Labs:  CBC:  Recent Labs  05/26/15 1701 05/27/15 0555 05/28/15 0533  WBC 16.4* 16.1* 11.6*  HGB 13.0 10.9* 10.5*  HCT 37.9 31.9* 31.4*  PLT 419* 345 327    COAGS:  Recent Labs  05/27/15 0555  INR 1.34    BMP:  Recent Labs  05/26/15 1701 05/27/15 0555 05/28/15 0533  NA 129* 134* 136  K 3.3* 3.1* 3.1*  CL 92* 99* 103  CO2 24 23 24   GLUCOSE 118* 82 92  BUN 22* 19 13  CALCIUM 8.8* 8.3* 8.2*  CREATININE 0.96 0.85 0.69  GFRNONAA 55* >60 >60  GFRAA >60 >60 >60    LIVER FUNCTION TESTS:  Recent Labs  05/26/15 1701  05/28/15 0533  BILITOT 1.9* 1.2  AST 91* 40  ALT 61* 38  ALKPHOS 123 90  PROT 7.4 5.4*  ALBUMIN 2.6* 1.7*    Assessment and Plan: Abdominal pain, anorexia and fatigue  Outside facility CT done revealed large abscess consistent with perforated diverticulitis S/p LLQ perc drain placed 12/17, output white purulent, wbc trending down, afebrile, follow Cx Plans per [facility]   Signed: Berneta Levins 05/28/2015, 10:49 AM   I spent a total of 15 Minutes at the the patient's bedside AND on  the patient's hospital floor or unit, greater than 50% of which was counseling/coordinating care for abdominal abscess

## 2015-05-28 NOTE — Progress Notes (Signed)
  Subjective: She is doing well, still pretty tender, drainage from IR drain is a white purulent fluid.    Objective: Vital signs in last 24 hours: Temp:  [97.8 F (36.6 C)-99.3 F (37.4 C)] 98.2 F (36.8 C) (12/18 0415) Pulse Rate:  [61-88] 78 (12/18 0415) Resp:  [12-25] 18 (12/18 0415) BP: (113-144)/(59-83) 113/63 mmHg (12/18 0415) SpO2:  [95 %-98 %] 95 % (12/18 0415) Last BM Date: 05/28/15 NPO 345 from the drain BM x 1 Afebrile, VSS WBC is better, K+ is low Intake/Output from previous day: 12/17 0701 - 12/18 0700 In: 1400 [I.V.:1000; IV Piggyback:400] Out: 345 [Drains:345] Intake/Output this shift: Total I/O In: 5 [Other:5] Out: -   General appearance: alert, cooperative and no distress GI: soft still very tender drain is present with white purulent fluid coming out.  Lab Results:   Recent Labs  05/27/15 0555 05/28/15 0533  WBC 16.1* 11.6*  HGB 10.9* 10.5*  HCT 31.9* 31.4*  PLT 345 327    BMET  Recent Labs  05/27/15 0555 05/28/15 0533  NA 134* 136  K 3.1* 3.1*  CL 99* 103  CO2 23 24  GLUCOSE 82 92  BUN 19 13  CREATININE 0.85 0.69  CALCIUM 8.3* 8.2*   PT/INR  Recent Labs  05/27/15 0555  LABPROT 16.7*  INR 1.34     Recent Labs Lab 05/26/15 1701 05/28/15 0533  AST 91* 40  ALT 61* 38  ALKPHOS 123 90  BILITOT 1.9* 1.2  PROT 7.4 5.4*  ALBUMIN 2.6* 1.7*     Lipase     Component Value Date/Time   LIPASE 29 05/26/2015 1701     Studies/Results: Ct Image Guided Drainage By Percutaneous Catheter  05/27/2015  CLINICAL DATA:  Left abdominal abscess EXAM: CT IMAGE GUIDED DRAINAGE BY PERCUTANEOUS CATHETER FLUOROSCOPY TIME:  None MEDICATIONS AND MEDICAL HISTORY: Versed 2 mg, Fentanyl 100 mcg. Additional Medications: None. ANESTHESIA/SEDATION: Moderate sedation time: 12 minutes CONTRAST:  None PROCEDURE: The procedure, risks, benefits, and alternatives were explained to the patient. Questions regarding the procedure were encouraged and  answered. The patient understands and consents to the procedure. The lower abdomen was prepped with Betadine in a sterile fashion, and a sterile drape was applied covering the operative field. A sterile gown and sterile gloves were used for the procedure. Under CT guidance, an 18 gauge needle was inserted into the lower abdominal abscess and removed over an Amplatz wire. A 12 French dilator followed by a 12 [place] drain were inserted. It was looped and string fixed and sewn to the skin. Pus was aspirated. FINDINGS: Images document 65 French drain placement into a lower abdominal abscess. COMPLICATIONS: None IMPRESSION: Successful lower abdominal abscess drainage. Electronically Signed   By: Jolaine Click M.D.   On: 05/27/2015 14:09    Medications: . enoxaparin (LOVENOX) injection  40 mg Subcutaneous Q24H  . piperacillin-tazobactam (ZOSYN)  IV  3.375 g Intravenous Q8H  . potassium chloride  40 mEq Oral Daily  . sodium chloride  3 mL Intravenous Q12H    Assessment/Plan Diverticulitis with abscess S/p IR drain placement 05/27/15 Mild transaminitits Hypertension CAD/hx of Takotsubo cardiomyopathy - resolved Antibiotics:  Day 3 Zosyn DVT:  SCD/Lovenox   Plan:  Discussed with her and son, how we are treating her.  Aiming for conservative treatment/medical management at this point.   LOS: 2 days    Layliana Devins 05/28/2015

## 2015-05-29 ENCOUNTER — Inpatient Hospital Stay (HOSPITAL_COMMUNITY): Payer: Medicare Other

## 2015-05-29 LAB — BASIC METABOLIC PANEL
ANION GAP: 13 (ref 5–15)
BUN: 15 mg/dL (ref 6–20)
CHLORIDE: 105 mmol/L (ref 101–111)
CO2: 20 mmol/L — AB (ref 22–32)
Calcium: 8.4 mg/dL — ABNORMAL LOW (ref 8.9–10.3)
Creatinine, Ser: 0.7 mg/dL (ref 0.44–1.00)
GFR calc Af Amer: >60 mL/min (ref >60)
GLUCOSE: 64 mg/dL — AB (ref 65–99)
POTASSIUM: 3.9 mmol/L (ref 3.5–5.1)
Sodium: 138 mmol/L (ref 135–145)

## 2015-05-29 LAB — CBC
HEMATOCRIT: 31.4 % — AB (ref 36.0–46.0)
HEMOGLOBIN: 10.2 g/dL — AB (ref 12.0–15.0)
MCH: 30.4 pg (ref 26.0–34.0)
MCHC: 32.5 g/dL (ref 30.0–36.0)
MCV: 93.7 fL (ref 78.0–100.0)
Platelets: 362 10*3/uL (ref 150–400)
RBC: 3.35 MIL/uL — ABNORMAL LOW (ref 3.87–5.11)
RDW: 14.5 % (ref 11.5–15.5)
WBC: 11.6 10*3/uL — ABNORMAL HIGH (ref 4.0–10.5)

## 2015-05-29 NOTE — Progress Notes (Signed)
PATIENT DETAILS Name: Rita Harding Age: 81 y.o. Sex: female Date of Birth: 05/27/1934 Admit Date: 05/26/2015 Admitting Physician Ron Parker, MD [id number] [name], MD  Subjective: Abd pain imroving-frank pus draining from left lower quadrant drain.  Assessment/Plan: Principal Problem: Colonic diverticular abscess with systemic inflammatory response syndrome: Improved with empiric Zosyn and placement of left lower quadrant drain. Continue nothing by mouth for now, will slowly advance diet.  Leukocytosis significantly improved. Abscess culture negative so far.  Active Problems: Mildly elevated transaminitis: Resolved, Likely secondary to above  Hypertension: Blood pressure currently controlled without the use of any antihypertensives. Follow  History of CAD: Stable without any chest pain or shortness of breath. Resume antiplatelets when oral intake is more stable.  Dyslipidemia: Resume statins were noted because most stable  History of Takotsubo cardiomyopathy: Per last outpatient cardiology note-this seems to have resolved. Euvolemic on exam, watch closely while on IV fluids  Disposition: Remain inpatient-home when able to tolerate diet.  Antimicrobial agents  See below  Anti-infectives    Start     Dose/Rate Route Frequency Ordered Stop   05/27/15 0400  piperacillin-tazobactam (ZOSYN) IVPB 3.375 g     3.375 g 12.5 mL/hr over 240 Minutes Intravenous Every 8 hours 05/26/15 2043     05/26/15 2200  piperacillin-tazobactam (ZOSYN) IVPB 3.375 g  Status:  Discontinued     3.375 g 100 mL/hr over 30 Minutes Intravenous 3 times per day 05/26/15 2003 05/26/15 2042   05/26/15 1945  piperacillin-tazobactam (ZOSYN) IVPB 3.375 g     3.375 g 100 mL/hr over 30 Minutes Intravenous  Once 05/26/15 1936 05/26/15 2046      DVT Prophylaxis: Prophylactic Lovenox  Code Status: Full code   Family Communication None at bedside  Procedures: LLQ Drain  placement 12/17 by IR  CONSULTS:  general surgery  Interventional radiology  Time spent 25 minutes-Greater than 50% of this time was spent in counseling, explanation of diagnosis, planning of further management, and coordination of care.  MEDICATIONS: Scheduled Meds: . enoxaparin (LOVENOX) injection  40 mg Subcutaneous Q24H  . piperacillin-tazobactam (ZOSYN)  IV  3.375 g Intravenous Q8H  . potassium chloride  40 mEq Oral Daily  . sodium chloride  3 mL Intravenous Q12H   Continuous Infusions: . sodium chloride 0.9 % 1,000 mL with potassium chloride 20 mEq infusion 75 mL/hr at 05/28/15 0822   PRN Meds:.acetaminophen **OR** acetaminophen, alum & mag hydroxide-simeth, HYDROmorphone (DILAUDID) injection, ondansetron **OR** ondansetron (ZOFRAN) IV    PHYSICAL EXAM: Vital signs in last 24 hours: Filed Vitals:   05/28/15 2030 05/29/15 0011 05/29/15 0416 05/29/15 1100  BP: 136/76 131/71 141/68 136/70  Pulse: 77 73 68 72  Temp: 98.3 F (36.8 C) 98.2 F (36.8 C) 98.3 F (36.8 C) 98.1 F (36.7 C)  TempSrc: Oral Oral Oral Oral  Resp: 18 18 18 18   Height:      Weight:      SpO2: 97% 96% 95% 97%    Weight change:  Filed Weights   05/26/15 2226  Weight: 60.737 kg (133 lb 14.4 oz)   Body mass index is 22.64 kg/(m^2).   Gen Exam: Awake and alert with clear speech.   Neck: Supple, No JVD.   Chest: B/L Clear.No rhonchi CVS: S1 S2 Regular, no murmurs.  Abdomen: soft, BS +, mildly tender in the left lower quadrant, non distended. Left lower quadrant drain in place Extremities: no  edema, lower extremities warm to touch. Neurologic: Non Focal.   Skin: No Rash.   Wounds: N/A.    Intake/Output from previous day:  Intake/Output Summary (Last 24 hours) at 05/29/15 1627 Last data filed at 05/29/15 1008  Gross per 24 hour  Intake    110 ml  Output    475 ml  Net   -365 ml     LAB RESULTS: CBC  Recent Labs Lab 05/26/15 1701 05/27/15 0555 05/28/15 0533 05/29/15 0634  WBC  16.4* 16.1* 11.6* 11.6*  HGB 13.0 10.9* 10.5* 10.2*  HCT 37.9 31.9* 31.4* 31.4*  PLT 419* 345 327 362  MCV 91.3 91.7 92.6 93.7  MCH 31.3 31.3 31.0 30.4  MCHC 34.3 34.2 33.4 32.5  RDW 14.0 14.3 14.5 14.5    Chemistries   Recent Labs Lab 05/26/15 1701 05/27/15 0555 05/28/15 0533 05/29/15 0634  NA 129* 134* 136 138  K 3.3* 3.1* 3.1* 3.9  CL 92* 99* 103 105  CO2 20*  GLUCOSE 118* 82 92 64*  BUN 22* CREATININE 0.96 0.85 0.69 0.70  CALCIUM 8.8* 8.3* 8.2* 8.4*    CBG: No results for input(s): GLUCAP in the last 168 hours.  GFR Estimated Creatinine Clearance: 50.3 mL/min (by C-G formula based on Cr of 0.7).  Coagulation profile  Recent Labs Lab 05/27/15 0555  INR 1.34    Cardiac Enzymes No results for input(s): CKMB, TROPONINI, MYOGLOBIN in the last 168 hours.  Invalid input(s): CK  Invalid input(s): POCBNP No results for input(s): DDIMER in the last 72 hours. No results for input(s): HGBA1C in the last 72 hours. No results for input(s): CHOL, HDL, LDLCALC, TRIG, CHOLHDL, LDLDIRECT in the last 72 hours. No results for input(s): TSH, T4TOTAL, T3FREE, THYROIDAB in the last 72 hours.  Invalid input(s): FREET3 No results for input(s): VITAMINB12, FOLATE, FERRITIN, TIBC, IRON, RETICCTPCT in the last 72 hours.  Recent Labs  05/26/15 1701  LIPASE 29    Urine Studies No results for input(s): UHGB, CRYS in the last 72 hours.  Invalid input(s): UACOL, UAPR, USPG, UPH, UTP, UGL, UKET, UBIL, UNIT, UROB, ULEU, UEPI, UWBC, URBC, UBAC, CAST, UCOM, BILUA  MICROBIOLOGY: Recent Results (from the past 240 hour(s))  Culture, routine-abscess     Status: None (Preliminary result)   Collection Time: 05/27/15  2:47 PM  Result Value Ref Range Status   Specimen Description ABSCESS ABDOMEN  Final   Special Requests NONE  Final   Gram Stain   Final    ABUNDANT WBC PRESENT,BOTH PMN AND MONONUCLEAR NO SQUAMOUS EPITHELIAL CELLS SEEN NO ORGANISMS  SEEN Performed at [facility]    Culture   Final    Culture reincubated for better growth Performed at [facility]    Report Status PENDING  Incomplete    RADIOLOGY STUDIES/RESULTS: Ct Abdomen Pelvis Wo Contrast  05/29/2015  CLINICAL DATA:  Generalized abdominal pain. Recent placement of drainage catheter for pelvic abscess of presumed diverticular origin. EXAM: CT ABDOMEN AND PELVIS WITHOUT CONTRAST TECHNIQUE: Multidetector CT imaging of the abdomen and pelvis was performed following the standard protocol without IV contrast. COMPARISON:  Outside CT abdomen/pelvis scan [facility] 05/26/2015 as well as recent CT from placement of drainage catheter 05/27/2015. FINDINGS: Lung bases demonstrate a tiny amount of bilateral pleural fluid and bibasilar atelectasis. Abdominal images demonstrate mild free peritoneal air over the mid to right-sided anterior upper abdomen. This was not seen previously and may have been introduced  at the time of drainage catheter placement. Evidence of possible small gallstones versus sludge. Few small calcified liver and splenic granulomas. The pancreas, adrenal glands and stomach are within normal. Kidneys are normal in size without hydronephrosis or nephrolithiasis. Mild calcified plaque over the abdominal aorta. Lower abdominal/ pelvic images demonstrate evidence of patient's percutaneous pigtail drain entering the left lower quadrant from anterior approach with pigtail over the region of the cul-de-sac over the most posterior dependent portion of known abscess cavity. Uterus appears to be compressed anterior to the catheter with sigmoid colon just posterior to the catheter. There is been a marked decrease in size of the pelvic abscess with near resolution of the previously seen large fluid component. The component of the abscess cavity over the midline pelvis over the cul-de-sac is difficult to define and has nearly resolved. The component of the  abscess over the anterior pelvis immediately above and anterior to the sigmoid colon is predominantly filled with air measuring 3.8 x 5.3 cm (previously 7.7 x 7.2 cm). There is decrease in size of a component of the abscess adjacent the left iliac vessels posterior to the sigmoid colon predominately fill with air measuring 1.4 x 2.4 cm (previously 2.4 x 4.5 cm). There is mild diverticulosis of the colon most notable over the sigmoid colon without significant inflammatory adjacent change. There is a mottled collection of extraluminal air adjacent to and just below the drainage catheter as it enters the left lower abdominal wall. Several small bowel loops are immediately lateral to this extraluminal air. Remaining pelvic images are notable for radiopaque ring device over the region of the cervix. Rectum is within normal. No significant free fluid. Small bladder diverticulum. Degenerative changes of the spine and hips. Multilevel disc disease over the lumbar spine. IMPRESSION: Diverticulosis of the colon. Significant interval decrease in size of patient's pelvic abscess of presumed diverticular origin. Pigtail drainage catheter over the posterior dependent portion of the abscess in the region of the cul-de-sac. Component of the abscess cavity in the region of the cul-de-sac adjacent the pigtail catheter has nearly resolved. Decreased size of the abscess component just anterior superior to the sigmoid colon measuring 3.8 x 5.3 cm (previously 7.7 x 7.2 cm) and decreased size of a left pelvic component measuring 1.4 x 2.4 cm (previously 2.4 x 4.5 cm). Mottled extraluminal air adjacent to and just below the entrance of the drainage catheter over the left lower abdominal wall with small amount of free peritoneal air over the anterior right upper abdomen. This air may have been introduced at the time of catheter placement and less likely small bowel injury at the entrance site in the left lower quadrant. Recommend attention  on follow-up exam. Tiny bilateral pleural effusions with bibasilar dependent atelectasis. Possible gallbladder sludge versus small stones. Small bladder diverticulum. Electronically Signed   By: Elberta Fortis M.D.   On: 05/29/2015 16:12   Ct Image Guided Drainage By Percutaneous Catheter  05/27/2015  CLINICAL DATA:  Left abdominal abscess EXAM: CT IMAGE GUIDED DRAINAGE BY PERCUTANEOUS CATHETER FLUOROSCOPY TIME:  None MEDICATIONS AND MEDICAL HISTORY: Versed 2 mg, Fentanyl 100 mcg. Additional Medications: None. ANESTHESIA/SEDATION: Moderate sedation time: 12 minutes CONTRAST:  None PROCEDURE: The procedure, risks, benefits, and alternatives were explained to the patient. Questions regarding the procedure were encouraged and answered. The patient understands and consents to the procedure. The lower abdomen was prepped with Betadine in a sterile fashion, and a sterile drape was applied covering the operative field. A sterile gown and  sterile gloves were used for the procedure. Under CT guidance, an 18 gauge needle was inserted into the lower abdominal abscess and removed over an Amplatz wire. A 12 French dilator followed by a 12 [place] drain were inserted. It was looped and string fixed and sewn to the skin. Pus was aspirated. FINDINGS: Images document 43 French drain placement into a lower abdominal abscess. COMPLICATIONS: None IMPRESSION: Successful lower abdominal abscess drainage. Electronically Signed   By: Jolaine Click M.D.   On: 05/27/2015 14:09    Jeoffrey Massed, MD  [facility] Pager:[phone]  If 7PM-7AM, please contact night-coverage [email or web address] Password [email or web address] 05/29/2015, 4:27 PM   LOS: 3 days

## 2015-05-29 NOTE — Clinical Documentation Improvement (Signed)
Internal Medicine  Abnormal Lab/Test Results:  05/28/15: K= 3.1  Possible Clinical Conditions associated with below indicators  Hypokalemia  Other Condition  Cannot Clinically Determine   Treatment Provided: KCL 40 meq po daily ordered. NS with 20 mEq KCL/liter @ 75 ml/hr ordered.    Please exercise your independent, professional judgment when responding. A specific answer is not anticipated or expected.   Thank You,  Cherylann Ratel, RN, BSN [facility] Information Management [place] [phone]

## 2015-05-29 NOTE — Progress Notes (Signed)
Patient ID: Rita Harding, female   DOB: 1933-11-05, 81 y.o.   MRN: [id number]          Subjective:  Pt doing ok; a little sore at LLQ drain site; denies N/V; has had BM  Allergies: Codeine and Tetanus toxoids  Medications: Prior to Admission medications   Medication Sig Start Date End Date Taking? Authorizing Provider  aspirin EC 81 MG tablet Take 81 mg by mouth daily.   Yes Historical Provider, MD  atorvastatin (LIPITOR) 80 MG tablet Take 1 tablet (80 mg total) by mouth daily. 11/09/12  Yes Susa Griffins, MD  benazepril (LOTENSIN) 10 MG tablet Take 10 mg by mouth daily.   Yes Historical Provider, MD  carvedilol (COREG) 12.5 MG tablet Take 12.5 mg by mouth 2 (two) times daily with a meal.   Yes Historical Provider, MD  cholecalciferol (VITAMIN D) 1000 UNITS tablet Take 1,000 Units by mouth daily.   Yes Historical Provider, MD  HYDROcodone-acetaminophen (NORCO/VICODIN) 5-325 MG per tablet Take 1/2-1 tablets every 6 hours as needed for severe pain 04/29/14   Renne Crigler, PA-C  Influenza vac split quadrivalent PF (FLUARIX) 0.5 ML injection Inject 0.5 mLs into the muscle once.    Historical Provider, MD  pneumococcal 13-valent conjugate vaccine (PREVNAR 13) SUSP injection Inject 0.5 mLs into the muscle once.    Historical Provider, MD     Vital Signs: BP 136/70 mmHg  Pulse 72  Temp(Src) 98.1 F (36.7 C) (Oral)  Resp 18  Ht 5' 4.5" (1.638 m)  Wt 133 lb 14.4 oz (60.737 kg)  BMI 22.64 kg/m2  SpO2 97%  Physical Exam LLQ drain intact, insertion site ok, mildly tender, output 85 cc cream colored fluid; cx's pend  Imaging: Ct Image Guided Drainage By Percutaneous Catheter  05/27/2015  CLINICAL DATA:  Left abdominal abscess EXAM: CT IMAGE GUIDED DRAINAGE BY PERCUTANEOUS CATHETER FLUOROSCOPY TIME:  None MEDICATIONS AND MEDICAL HISTORY: Versed 2 mg, Fentanyl 100 mcg. Additional Medications: None. ANESTHESIA/SEDATION: Moderate sedation time: 12 minutes CONTRAST:  None  PROCEDURE: The procedure, risks, benefits, and alternatives were explained to the patient. Questions regarding the procedure were encouraged and answered. The patient understands and consents to the procedure. The lower abdomen was prepped with Betadine in a sterile fashion, and a sterile drape was applied covering the operative field. A sterile gown and sterile gloves were used for the procedure. Under CT guidance, an 18 gauge needle was inserted into the lower abdominal abscess and removed over an Amplatz wire. A 12 French dilator followed by a 12 [place] drain were inserted. It was looped and string fixed and sewn to the skin. Pus was aspirated. FINDINGS: Images document 36 French drain placement into a lower abdominal abscess. COMPLICATIONS: None IMPRESSION: Successful lower abdominal abscess drainage. Electronically Signed   By: Jolaine Click M.D.   On: 05/27/2015 14:09    Labs:  CBC:  Recent Labs  05/26/15 1701 05/27/15 0555 05/28/15 0533 05/29/15 0634  WBC 16.4* 16.1* 11.6* 11.6*  HGB 13.0 10.9* 10.5* 10.2*  HCT 37.9 31.9* 31.4* 31.4*  PLT 419* 345 327 362    COAGS:  Recent Labs  05/27/15 0555  INR 1.34    BMP:  Recent Labs  05/26/15 1701 05/27/15 0555 05/28/15 0533 05/29/15 0634  NA 129* 134* 136 138  K 3.3* 3.1* 3.1* 3.9  CL 92* 99* 103 105  CO2 24 23 24  20*  GLUCOSE 118* 82 92 64*  BUN 22* 19 13 15   CALCIUM 8.8* 8.3*  8.2* 8.4*  CREATININE 0.96 0.85 0.69 0.70  GFRNONAA 55* >60 >60 >60  GFRAA >60 >60 >60 >60    LIVER FUNCTION TESTS:  Recent Labs  05/26/15 1701 05/28/15 0533  BILITOT 1.9* 1.2  AST 91* 40  ALT 61* 38  ALKPHOS 123 90  PROT 7.4 5.4*  ALBUMIN 2.6* 1.7*    Assessment and Plan: S/p LLQ divert abscess drain 12/17; check final cx's; cont drain irrigation; afebrile; WBC 11.6, creat ok; check f/u CT within 1 week of drain placement; will also need drain injection before removal   Signed: D. Jeananne Rama 05/29/2015, 12:43 PM   I spent a  total of 15 minutes at the the patient's bedside AND on the patient's hospital floor or unit, greater than 50% of which was counseling/coordinating care for pelvic abscess drain

## 2015-05-29 NOTE — Care Management Important Message (Signed)
Important Message  Patient Details  Name: Rita Harding MRN: [id number] Date of Birth: 09/23/1933   Medicare Important Message Given:  Yes    Shoshanah Dapper Abena 05/29/2015, 12:20 PM

## 2015-05-29 NOTE — Progress Notes (Signed)
  Subjective: She is a little better, has some pain with on moving.  Drainage is still purulent colored fluid.    Objective: Vital signs in last 24 hours: Temp:  [98.1 F (36.7 C)-98.3 F (36.8 C)] 98.3 F (36.8 C) (12/19 0416) Pulse Rate:  [68-93] 68 (12/19 0416) Resp:  [18] 18 (12/19 0416) BP: (120-141)/(58-76) 141/68 mmHg (12/19 0416) SpO2:  [94 %-97 %] 95 % (12/19 0416) Last BM Date: 05/28/15 NPO Drain 85 ml yesterday recorded +BM Afebrile, VSS WBC stable, at 11.6 Intake/Output from previous day: 12/18 0701 - 12/19 0700 In: 115 [IV Piggyback:100] Out: 487 [Urine:401; Drains:85; Stool:1] Intake/Output this shift:    General appearance: alert, cooperative and no distress GI: soft, but still tender.  site for drain looks fine, she is still draining purulent fluid from IR drain.  Lab Results:   Recent Labs  05/28/15 0533 05/29/15 0634  WBC 11.6* 11.6*  HGB 10.5* 10.2*  HCT 31.4* 31.4*  PLT 327 362    BMET  Recent Labs  05/28/15 0533 05/29/15 0634  NA 136 138  K 3.1* 3.9  CL 103 105  CO2 24 20*  GLUCOSE 92 64*  BUN 13 15  CREATININE 0.69 0.70  CALCIUM 8.2* 8.4*   PT/INR  Recent Labs  05/27/15 0555  LABPROT 16.7*  INR 1.34     Recent Labs Lab 05/26/15 1701 05/28/15 0533  AST 91* 40  ALT 61* 38  ALKPHOS 123 90  BILITOT 1.9* 1.2  PROT 7.4 5.4*  ALBUMIN 2.6* 1.7*     Lipase     Component Value Date/Time   LIPASE 29 05/26/2015 1701     Studies/Results: Ct Image Guided Drainage By Percutaneous Catheter  05/27/2015  CLINICAL DATA:  Left abdominal abscess EXAM: CT IMAGE GUIDED DRAINAGE BY PERCUTANEOUS CATHETER FLUOROSCOPY TIME:  None MEDICATIONS AND MEDICAL HISTORY: Versed 2 mg, Fentanyl 100 mcg. Additional Medications: None. ANESTHESIA/SEDATION: Moderate sedation time: 12 minutes CONTRAST:  None PROCEDURE: The procedure, risks, benefits, and alternatives were explained to the patient. Questions regarding the procedure were encouraged  and answered. The patient understands and consents to the procedure. The lower abdomen was prepped with Betadine in a sterile fashion, and a sterile drape was applied covering the operative field. A sterile gown and sterile gloves were used for the procedure. Under CT guidance, an 18 gauge needle was inserted into the lower abdominal abscess and removed over an Amplatz wire. A 12 French dilator followed by a 12 [place] drain were inserted. It was looped and string fixed and sewn to the skin. Pus was aspirated. FINDINGS: Images document 3 French drain placement into a lower abdominal abscess. COMPLICATIONS: None IMPRESSION: Successful lower abdominal abscess drainage. Electronically Signed   By: Jolaine Click M.D.   On: 05/27/2015 14:09    Medications: . enoxaparin (LOVENOX) injection  40 mg Subcutaneous Q24H  . piperacillin-tazobactam (ZOSYN)  IV  3.375 g Intravenous Q8H  . potassium chloride  40 mEq Oral Daily  . sodium chloride  3 mL Intravenous Q12H    Assessment/Plan Diverticulitis with abscess S/p IR drain placement 05/27/15 Mild transaminitits Hypertension CAD/hx of Takotsubo cardiomyopathy - resolved Antibiotics: Day 4 Zosyn DVT: SCD/Lovenox   Plan:  Continue antibiotics, and drainage, NPO except ice chips for now.    LOS: 3 days    Bernardino Dowell 05/29/2015

## 2015-05-29 NOTE — Care Management Note (Signed)
Case Management Note  Patient Details  Name: Rita Harding MRN: [id number] Date of Birth: Sep 23, 1933  Subjective/Objective:    Date: 05/29/15 Spoke with patient at the bedside.  Introduced self as Sports coach and explained role in discharge planning and how to be reached.  Verified patient lives in [place] [facility] daughter and her granddaughter.  Has DME cane and rolling  walker  At home . Expressed potential need for no other DME.  Verified patient anticipates to go home with family, at time of discharge and will have part-time supervision by family at this time to best of their knowledge. Patient denied needing help with their medication.  Patient is driven by daughter to MD appointments.  Verified patient has PCP Dr. Wylene Simmer.   Plan: CM will continue to follow for discharge planning and [facility] resources.                 Action/Plan:   Expected Discharge Date:                  Expected Discharge Plan:  Home w Home Health Services  In-House Referral:     Discharge planning Services  CM Consult  Post Acute Care Choice:    Choice offered to:     DME Arranged:    DME Agency:     HH Arranged:    HH Agency:     Status of Service:  In process, will continue to follow  Medicare Important Message Given:  Yes Date Medicare IM Given:    Medicare IM give by:    Date Additional Medicare IM Given:    Additional Medicare Important Message give by:     If discussed at Long Length of Stay Meetings, dates discussed:    Additional Comments:  Leone Haven, RN 05/29/2015, 3:51 PM

## 2015-05-30 LAB — CBC
HEMATOCRIT: 31.5 % — AB (ref 36.0–46.0)
HEMOGLOBIN: 10.3 g/dL — AB (ref 12.0–15.0)
MCH: 30.7 pg (ref 26.0–34.0)
MCHC: 32.7 g/dL (ref 30.0–36.0)
MCV: 94 fL (ref 78.0–100.0)
Platelets: 392 10*3/uL (ref 150–400)
RBC: 3.35 MIL/uL — AB (ref 3.87–5.11)
RDW: 14.7 % (ref 11.5–15.5)
WBC: 9 10*3/uL (ref 4.0–10.5)

## 2015-05-30 LAB — BASIC METABOLIC PANEL
ANION GAP: 13 (ref 5–15)
BUN: 9 mg/dL (ref 6–20)
CALCIUM: 8.6 mg/dL — AB (ref 8.9–10.3)
CO2: 20 mmol/L — ABNORMAL LOW (ref 22–32)
Chloride: 104 mmol/L (ref 101–111)
Creatinine, Ser: 0.64 mg/dL (ref 0.44–1.00)
GLUCOSE: 74 mg/dL (ref 65–99)
POTASSIUM: 4.2 mmol/L (ref 3.5–5.1)
SODIUM: 137 mmol/L (ref 135–145)

## 2015-05-30 LAB — URINE MICROSCOPIC-ADD ON

## 2015-05-30 LAB — URINALYSIS, ROUTINE W REFLEX MICROSCOPIC
Glucose, UA: NEGATIVE mg/dL
Ketones, ur: 40 mg/dL — AB
NITRITE: NEGATIVE
Protein, ur: NEGATIVE mg/dL
SPECIFIC GRAVITY, URINE: 1.021 (ref 1.005–1.030)
pH: 5.5 (ref 5.0–8.0)

## 2015-05-30 MED ORDER — CARVEDILOL 12.5 MG PO TABS
12.5000 mg | ORAL_TABLET | Freq: Two times a day (BID) | ORAL | Status: DC
  Administered 2015-05-30 – 2015-06-01 (×4): 12.5 mg via ORAL
  Filled 2015-05-30 (×4): qty 1

## 2015-05-30 MED ORDER — ATORVASTATIN CALCIUM 80 MG PO TABS
80.0000 mg | ORAL_TABLET | Freq: Every day | ORAL | Status: DC
  Administered 2015-05-31 – 2015-06-01 (×2): 80 mg via ORAL
  Filled 2015-05-30 (×2): qty 1

## 2015-05-30 NOTE — Evaluation (Addendum)
Physical Therapy Evaluation Patient Details Name: Rita Harding MRN: [id number] DOB: 22-Jun-1933 Today's Date: 05/30/2015   History of Present Illness  Pt adm with Diverticulitis with abscess and underwent drain placement on 12/17. PMH- CAD/hx of Takotsubo cardiomyopathy - resolved. HTN  Clinical Impression  Pt doing well with mobility and will continue to amb in halls with family. Will follow up in a few days to make sure pt progresses to modified independent with bed mobility and stairs.    Follow Up Recommendations No PT follow up    Equipment Recommendations  None recommended by PT    Recommendations for Other Services       Precautions / Restrictions Precautions Precautions: None      Mobility  Bed Mobility Overal bed mobility: Needs Assistance Bed Mobility: Sidelying to Sit;Sit to Supine   Sidelying to sit: Min assist   Sit to supine: Supervision   General bed mobility comments: Assist to elevate trunk into sitting.  Transfers Overall transfer level: Modified independent                  Ambulation/Gait Ambulation/Gait assistance: Modified independent (Device/Increase time) Ambulation Distance (Feet): 500 Feet Assistive device: None (pushing IV) Gait Pattern/deviations: Step-through pattern     General Gait Details: Steady gait. Fatigued before stairs attempted  Information systems manager [place] (Stroke Patients Only)       Balance Overall balance assessment: No apparent balance deficits (not formally assessed)                                           Pertinent Vitals/Pain Pain Assessment: Faces Faces Pain Scale: Hurts little more Pain Location: abdomen Pain Descriptors / Indicators: Grimacing;Guarding Pain Intervention(s): Limited activity within patient's tolerance    Home Living Family/patient expects to be discharged to:: Private residence Living Arrangements: Children;Other  relatives Available Help at Discharge: Family Type of Home: House Home Access: Stairs to enter     Home Layout: Two level;Able to live on main level with bedroom/bathroom Home Equipment: None      Prior Function Level of Independence: Independent         Comments: Very active     Hand Dominance        Extremity/Trunk Assessment   Upper Extremity Assessment: Overall WFL for tasks assessed           Lower Extremity Assessment: Overall WFL for tasks assessed         Communication   Communication: No difficulties  Cognition Arousal/Alertness: Awake/alert Behavior During Therapy: WFL for tasks assessed/performed Overall Cognitive Status: Within Functional Limits for tasks assessed                      General Comments      Exercises        Assessment/Plan    PT Assessment Patient needs continued PT services  PT Diagnosis Difficulty walking   PT Problem List Decreased mobility  PT Treatment Interventions Stair training;Functional mobility training;Patient/family education   PT Goals (Current goals can be found in the Care Plan section) Acute Rehab PT Goals Patient Stated Goal: return home PT Goal Formulation: With patient Time For Goal Achievement: 06/02/15 Potential to Achieve Goals: Good    Frequency Min 3X/week   Barriers to discharge  Co-evaluation               End of Session   Activity Tolerance: Patient tolerated treatment well Patient left: in bed;with call bell/phone within reach           Time: 1914-7829 PT Time Calculation (min) (ACUTE ONLY): 20 min   Charges:   PT Evaluation $Initial PT Evaluation Tier I: 1 Procedure     PT G Codes:        Caeson Filippi June 06, 2015, 4:15 PM [facility] PT [phone]

## 2015-05-30 NOTE — Progress Notes (Signed)
PATIENT DETAILS Name: Rita Harding Age: 81 y.o. Sex: female Date of Birth: 1933-06-25 Admit Date: 05/26/2015 Admitting Physician Ron Parker, MD [id number] [name], MD  Subjective: Abd pain imroving-frank pus draining from left lower quadrant drain.  Assessment/Plan: Principal Problem: Colonic diverticular abscess with systemic inflammatory response syndrome: Improved with empiric Zosyn and placement of left lower quadrant drain. Repeat CT abdomen on 12/19, shows improvement in the size of the abscesses. Initially kept nothing by mouth, subsequently started on clear liquids. Leukocytosis significantly improved. Abscess culture-preliminary-Escherichia coli. Awaiting sensitivities  Active Problems: Mildly elevated transaminitis: Resolved, Likely secondary to above  Hypokalemia: Likely secondary to GI loss, repleted  Hypertension: Blood pressure now creeping up, resume Coreg, resume benazepril over the next few days. Follow   History of CAD: Stable without any chest pain or shortness of breath. Resume antiplatelets in the next few days.  Dyslipidemia: Resume statins   History of Takotsubo cardiomyopathy: Per last outpatient cardiology note-this seems to have resolved. Euvolemic on exam, watch closely while on IV fluids  Disposition: Remain inpatient-home when able to tolerate diet.  Antimicrobial agents  See below  Anti-infectives    Start     Dose/Rate Route Frequency Ordered Stop   05/27/15 0400  piperacillin-tazobactam (ZOSYN) IVPB 3.375 g     3.375 g 12.5 mL/hr over 240 Minutes Intravenous Every 8 hours 05/26/15 2043     05/26/15 2200  piperacillin-tazobactam (ZOSYN) IVPB 3.375 g  Status:  Discontinued     3.375 g 100 mL/hr over 30 Minutes Intravenous 3 times per day 05/26/15 2003 05/26/15 2042   05/26/15 1945  piperacillin-tazobactam (ZOSYN) IVPB 3.375 g     3.375 g 100 mL/hr over 30 Minutes Intravenous  Once 05/26/15 1936 05/26/15 2046       DVT Prophylaxis: Prophylactic Lovenox  Code Status: Full code   Family Communication None at bedside  Procedures: LLQ Drain placement 12/17 by IR  CONSULTS:  general surgery  Interventional radiology  Time spent 25 minutes-Greater than 50% of this time was spent in counseling, explanation of diagnosis, planning of further management, and coordination of care.  MEDICATIONS: Scheduled Meds: . enoxaparin (LOVENOX) injection  40 mg Subcutaneous Q24H  . piperacillin-tazobactam (ZOSYN)  IV  3.375 g Intravenous Q8H  . potassium chloride  40 mEq Oral Daily  . sodium chloride  3 mL Intravenous Q12H   Continuous Infusions: . sodium chloride 0.9 % 1,000 mL with potassium chloride 20 mEq infusion 75 mL/hr at 05/28/15 0822   PRN Meds:.acetaminophen **OR** acetaminophen, alum & mag hydroxide-simeth, HYDROmorphone (DILAUDID) injection, ondansetron **OR** ondansetron (ZOFRAN) IV    PHYSICAL EXAM: Vital signs in last 24 hours: Filed Vitals:   05/29/15 1652 05/29/15 2047 05/30/15 0034 05/30/15 0537  BP: 134/72 163/75 143/73 140/90  Pulse: 71 72 72 72  Temp: 98.2 F (36.8 C) 98 F (36.7 C) 99 F (37.2 C) 98.6 F (37 C)  TempSrc: Oral Oral Oral Oral  Resp: 18 18 18 18   Height:      Weight:      SpO2: 98% 97% 95% 96%    Weight change:  Filed Weights   05/26/15 2226  Weight: 60.737 kg (133 lb 14.4 oz)   Body mass index is 22.64 kg/(m^2).   Gen Exam: Awake and alert with clear speech.   Neck: Supple, No JVD.   Chest: B/L Clear.No rales CVS: S1 S2 Regular, no murmurs.  Abdomen:  soft, BS +, still mildly tender in the left lower quadrant, non distended. Left lower quadrant drain in place Extremities: no edema, lower extremities warm to touch. Neurologic: Non Focal.   Skin: No Rash.   Wounds: N/A.    Intake/Output from previous day:  Intake/Output Summary (Last 24 hours) at 05/30/15 0747 Last data filed at 05/30/15 0744  Gross per 24 hour  Intake 3723.75 ml    Output   1651 ml  Net 2072.75 ml     LAB RESULTS: CBC  Recent Labs Lab 05/26/15 1701 05/27/15 0555 05/28/15 0533 05/29/15 0634 05/30/15 0600  WBC 16.4* 16.1* 11.6* 11.6* 9.0  HGB 13.0 10.9* 10.5* 10.2* 10.3*  HCT 37.9 31.9* 31.4* 31.4* 31.5*  PLT 419* 345 327 362 392  MCV 91.3 91.7 92.6 93.7 94.0  MCH 31.3 31.3 31.0 30.4 30.7  MCHC 34.3 34.2 33.4 32.5 32.7  RDW 14.0 14.3 14.5 14.5 14.7    Chemistries   Recent Labs Lab 05/26/15 1701 05/27/15 0555 05/28/15 0533 05/29/15 0634  NA 129* 134* 136 138  K 3.3* 3.1* 3.1* 3.9  CL 92* 99* 103 105  CO2 20*  GLUCOSE 118* 82 92 64*  BUN 22* CREATININE 0.96 0.85 0.69 0.70  CALCIUM 8.8* 8.3* 8.2* 8.4*    CBG: No results for input(s): GLUCAP in the last 168 hours.  GFR Estimated Creatinine Clearance: 50.3 mL/min (by C-G formula based on Cr of 0.7).  Coagulation profile  Recent Labs Lab 05/27/15 0555  INR 1.34    Cardiac Enzymes No results for input(s): CKMB, TROPONINI, MYOGLOBIN in the last 168 hours.  Invalid input(s): CK  Invalid input(s): POCBNP No results for input(s): DDIMER in the last 72 hours. No results for input(s): HGBA1C in the last 72 hours. No results for input(s): CHOL, HDL, LDLCALC, TRIG, CHOLHDL, LDLDIRECT in the last 72 hours. No results for input(s): TSH, T4TOTAL, T3FREE, THYROIDAB in the last 72 hours.  Invalid input(s): FREET3 No results for input(s): VITAMINB12, FOLATE, FERRITIN, TIBC, IRON, RETICCTPCT in the last 72 hours. No results for input(s): LIPASE, AMYLASE in the last 72 hours.  Urine Studies No results for input(s): UHGB, CRYS in the last 72 hours.  Invalid input(s): UACOL, UAPR, USPG, UPH, UTP, UGL, UKET, UBIL, UNIT, UROB, ULEU, UEPI, UWBC, URBC, UBAC, CAST, UCOM, BILUA  MICROBIOLOGY: Recent Results (from the past 240 hour(s))  Culture, routine-abscess     Status: None (Preliminary result)   Collection Time: 05/27/15  2:47 PM  Result Value Ref Range  Status   Specimen Description ABSCESS ABDOMEN  Final   Special Requests NONE  Final   Gram Stain   Final    ABUNDANT WBC PRESENT,BOTH PMN AND MONONUCLEAR NO SQUAMOUS EPITHELIAL CELLS SEEN NO ORGANISMS SEEN Performed at [facility]    Culture   Final    Culture reincubated for better growth Performed at [facility]    Report Status PENDING  Incomplete    RADIOLOGY STUDIES/RESULTS: Ct Abdomen Pelvis Wo Contrast  05/29/2015  CLINICAL DATA:  Generalized abdominal pain. Recent placement of drainage catheter for pelvic abscess of presumed diverticular origin. EXAM: CT ABDOMEN AND PELVIS WITHOUT CONTRAST TECHNIQUE: Multidetector CT imaging of the abdomen and pelvis was performed following the standard protocol without IV contrast. COMPARISON:  Outside CT abdomen/pelvis scan [facility] 05/26/2015 as well as recent CT from placement of drainage catheter 05/27/2015. FINDINGS: Lung bases demonstrate a tiny amount of bilateral pleural fluid and bibasilar  atelectasis. Abdominal images demonstrate mild free peritoneal air over the mid to right-sided anterior upper abdomen. This was not seen previously and may have been introduced at the time of drainage catheter placement. Evidence of possible small gallstones versus sludge. Few small calcified liver and splenic granulomas. The pancreas, adrenal glands and stomach are within normal. Kidneys are normal in size without hydronephrosis or nephrolithiasis. Mild calcified plaque over the abdominal aorta. Lower abdominal/ pelvic images demonstrate evidence of patient's percutaneous pigtail drain entering the left lower quadrant from anterior approach with pigtail over the region of the cul-de-sac over the most posterior dependent portion of known abscess cavity. Uterus appears to be compressed anterior to the catheter with sigmoid colon just posterior to the catheter. There is been a marked decrease in size of the pelvic abscess with near  resolution of the previously seen large fluid component. The component of the abscess cavity over the midline pelvis over the cul-de-sac is difficult to define and has nearly resolved. The component of the abscess over the anterior pelvis immediately above and anterior to the sigmoid colon is predominantly filled with air measuring 3.8 x 5.3 cm (previously 7.7 x 7.2 cm). There is decrease in size of a component of the abscess adjacent the left iliac vessels posterior to the sigmoid colon predominately fill with air measuring 1.4 x 2.4 cm (previously 2.4 x 4.5 cm). There is mild diverticulosis of the colon most notable over the sigmoid colon without significant inflammatory adjacent change. There is a mottled collection of extraluminal air adjacent to and just below the drainage catheter as it enters the left lower abdominal wall. Several small bowel loops are immediately lateral to this extraluminal air. Remaining pelvic images are notable for radiopaque ring device over the region of the cervix. Rectum is within normal. No significant free fluid. Small bladder diverticulum. Degenerative changes of the spine and hips. Multilevel disc disease over the lumbar spine. IMPRESSION: Diverticulosis of the colon. Significant interval decrease in size of patient's pelvic abscess of presumed diverticular origin. Pigtail drainage catheter over the posterior dependent portion of the abscess in the region of the cul-de-sac. Component of the abscess cavity in the region of the cul-de-sac adjacent the pigtail catheter has nearly resolved. Decreased size of the abscess component just anterior superior to the sigmoid colon measuring 3.8 x 5.3 cm (previously 7.7 x 7.2 cm) and decreased size of a left pelvic component measuring 1.4 x 2.4 cm (previously 2.4 x 4.5 cm). Mottled extraluminal air adjacent to and just below the entrance of the drainage catheter over the left lower abdominal wall with small amount of free peritoneal air over  the anterior right upper abdomen. This air may have been introduced at the time of catheter placement and less likely small bowel injury at the entrance site in the left lower quadrant. Recommend attention on follow-up exam. Tiny bilateral pleural effusions with bibasilar dependent atelectasis. Possible gallbladder sludge versus small stones. Small bladder diverticulum. Electronically Signed   By: Elberta Fortis M.D.   On: 05/29/2015 16:12   Ct Image Guided Drainage By Percutaneous Catheter  05/27/2015  CLINICAL DATA:  Left abdominal abscess EXAM: CT IMAGE GUIDED DRAINAGE BY PERCUTANEOUS CATHETER FLUOROSCOPY TIME:  None MEDICATIONS AND MEDICAL HISTORY: Versed 2 mg, Fentanyl 100 mcg. Additional Medications: None. ANESTHESIA/SEDATION: Moderate sedation time: 12 minutes CONTRAST:  None PROCEDURE: The procedure, risks, benefits, and alternatives were explained to the patient. Questions regarding the procedure were encouraged and answered. The patient understands and consents to the  procedure. The lower abdomen was prepped with Betadine in a sterile fashion, and a sterile drape was applied covering the operative field. A sterile gown and sterile gloves were used for the procedure. Under CT guidance, an 18 gauge needle was inserted into the lower abdominal abscess and removed over an Amplatz wire. A 12 French dilator followed by a 12 [place] drain were inserted. It was looped and string fixed and sewn to the skin. Pus was aspirated. FINDINGS: Images document 12 French drain placement into a lower abdominal abscess. COMPLICATIONS: None IMPRESSION: Successful lower abdominal abscess drainage. Electronically Signed   By: Jolaine Click M.D.   On: 05/27/2015 14:09    Jeoffrey Massed, MD  [facility] Pager:[phone]  If 7PM-7AM, please contact night-coverage [email or web address] Password [email or web address] 05/30/2015, 7:47 AM   LOS: 4 days

## 2015-05-30 NOTE — Progress Notes (Signed)
Subjective: Pt states improved abd pain.   No n/v.  Soft BMs  Objective: Vital signs in last 24 hours: Temp:  [98 F (36.7 C)-99 F (37.2 C)] 98.6 F (37 C) (12/20 0537) Pulse Rate:  [71-72] 72 (12/20 0537) Resp:  [18] 18 (12/20 0537) BP: (134-163)/(70-90) 140/90 mmHg (12/20 0537) SpO2:  [95 %-98 %] 96 % (12/20 0537) Last BM Date: 05/30/15  Intake/Output from previous day: 12/19 0701 - 12/20 0700 In: 3723.8 [P.O.:240; I.V.:3423.8; IV Piggyback:50] Out: 1651 [Urine:1550; Drains:100; Stool:1] Intake/Output this shift: Total I/O In: 0  Out: 50 [Drains:50]  General appearance: alert and cooperative GI: soft, ttp, LLQ, drain with opaque output  Lab Results:   Recent Labs  05/29/15 0634 05/30/15 0600  WBC 11.6* 9.0  HGB 10.2* 10.3*  HCT 31.4* 31.5*  PLT 362 392   BMET  Recent Labs  05/29/15 0634 05/30/15 0600  NA 138 137  K 3.9 4.2  CL 105 104  CO2 20* 20*  GLUCOSE 64* 74  BUN 15 9  CREATININE 0.70 0.64  CALCIUM 8.4* 8.6*   PT/INR No results for input(s): LABPROT, INR in the last 72 hours. ABG No results for input(s): PHART, HCO3 in the last 72 hours.  Invalid input(s): PCO2, PO2  Studies/Results: Ct Abdomen Pelvis Wo Contrast  05/29/2015  CLINICAL DATA:  Generalized abdominal pain. Recent placement of drainage catheter for pelvic abscess of presumed diverticular origin. EXAM: CT ABDOMEN AND PELVIS WITHOUT CONTRAST TECHNIQUE: Multidetector CT imaging of the abdomen and pelvis was performed following the standard protocol without IV contrast. COMPARISON:  Outside CT abdomen/pelvis scan [facility] 05/26/2015 as well as recent CT from placement of drainage catheter 05/27/2015. FINDINGS: Lung bases demonstrate a tiny amount of bilateral pleural fluid and bibasilar atelectasis. Abdominal images demonstrate mild free peritoneal air over the mid to right-sided anterior upper abdomen. This was not seen previously and may have been introduced at the time  of drainage catheter placement. Evidence of possible small gallstones versus sludge. Few small calcified liver and splenic granulomas. The pancreas, adrenal glands and stomach are within normal. Kidneys are normal in size without hydronephrosis or nephrolithiasis. Mild calcified plaque over the abdominal aorta. Lower abdominal/ pelvic images demonstrate evidence of patient's percutaneous pigtail drain entering the left lower quadrant from anterior approach with pigtail over the region of the cul-de-sac over the most posterior dependent portion of known abscess cavity. Uterus appears to be compressed anterior to the catheter with sigmoid colon just posterior to the catheter. There is been a marked decrease in size of the pelvic abscess with near resolution of the previously seen large fluid component. The component of the abscess cavity over the midline pelvis over the cul-de-sac is difficult to define and has nearly resolved. The component of the abscess over the anterior pelvis immediately above and anterior to the sigmoid colon is predominantly filled with air measuring 3.8 x 5.3 cm (previously 7.7 x 7.2 cm). There is decrease in size of a component of the abscess adjacent the left iliac vessels posterior to the sigmoid colon predominately fill with air measuring 1.4 x 2.4 cm (previously 2.4 x 4.5 cm). There is mild diverticulosis of the colon most notable over the sigmoid colon without significant inflammatory adjacent change. There is a mottled collection of extraluminal air adjacent to and just below the drainage catheter as it enters the left lower abdominal wall. Several small bowel loops are immediately lateral to this extraluminal air. Remaining pelvic images are notable for radiopaque  ring device over the region of the cervix. Rectum is within normal. No significant free fluid. Small bladder diverticulum. Degenerative changes of the spine and hips. Multilevel disc disease over the lumbar spine. IMPRESSION:  Diverticulosis of the colon. Significant interval decrease in size of patient's pelvic abscess of presumed diverticular origin. Pigtail drainage catheter over the posterior dependent portion of the abscess in the region of the cul-de-sac. Component of the abscess cavity in the region of the cul-de-sac adjacent the pigtail catheter has nearly resolved. Decreased size of the abscess component just anterior superior to the sigmoid colon measuring 3.8 x 5.3 cm (previously 7.7 x 7.2 cm) and decreased size of a left pelvic component measuring 1.4 x 2.4 cm (previously 2.4 x 4.5 cm). Mottled extraluminal air adjacent to and just below the entrance of the drainage catheter over the left lower abdominal wall with small amount of free peritoneal air over the anterior right upper abdomen. This air may have been introduced at the time of catheter placement and less likely small bowel injury at the entrance site in the left lower quadrant. Recommend attention on follow-up exam. Tiny bilateral pleural effusions with bibasilar dependent atelectasis. Possible gallbladder sludge versus small stones. Small bladder diverticulum. Electronically Signed   By: Elberta Fortis M.D.   On: 05/29/2015 16:12    Anti-infectives: Anti-infectives    Start     Dose/Rate Route Frequency Ordered Stop   05/27/15 0400  piperacillin-tazobactam (ZOSYN) IVPB 3.375 g     3.375 g 12.5 mL/hr over 240 Minutes Intravenous Every 8 hours 05/26/15 2043     05/26/15 2200  piperacillin-tazobactam (ZOSYN) IVPB 3.375 g  Status:  Discontinued     3.375 g 100 mL/hr over 30 Minutes Intravenous 3 times per day 05/26/15 2003 05/26/15 2042   05/26/15 1945  piperacillin-tazobactam (ZOSYN) IVPB 3.375 g     3.375 g 100 mL/hr over 30 Minutes Intravenous  Once 05/26/15 1936 05/26/15 2046      Assessment/Plan: Diverticulitis with abscess S/p IR drain placement 05/27/15 Mild transaminitits Hypertension CAD/hx of Takotsubo cardiomyopathy -  resolved Antibiotics: Day 5 Zosyn DVT: SCD/Lovenox  Adv to CLD Mobilize    LOS: 4 days    Marigene Ehlers., [facility] 05/30/2015

## 2015-05-31 LAB — CULTURE, ROUTINE-ABSCESS

## 2015-05-31 LAB — BASIC METABOLIC PANEL
Anion gap: 8 (ref 5–15)
CALCIUM: 8.2 mg/dL — AB (ref 8.9–10.3)
CO2: 24 mmol/L (ref 22–32)
CREATININE: 0.47 mg/dL (ref 0.44–1.00)
Chloride: 105 mmol/L (ref 101–111)
GFR calc Af Amer: >60 mL/min (ref >60)
Glucose, Bld: 112 mg/dL — ABNORMAL HIGH (ref 65–99)
Potassium: 4.4 mmol/L (ref 3.5–5.1)
SODIUM: 137 mmol/L (ref 135–145)

## 2015-05-31 MED ORDER — CIPROFLOXACIN HCL 500 MG PO TABS
500.0000 mg | ORAL_TABLET | Freq: Two times a day (BID) | ORAL | Status: DC
  Administered 2015-05-31 – 2015-06-01 (×3): 500 mg via ORAL
  Filled 2015-05-31 (×3): qty 1

## 2015-05-31 MED ORDER — METRONIDAZOLE 500 MG PO TABS
500.0000 mg | ORAL_TABLET | Freq: Three times a day (TID) | ORAL | Status: DC
  Administered 2015-05-31 – 2015-06-01 (×4): 500 mg via ORAL
  Filled 2015-05-31 (×3): qty 1

## 2015-05-31 MED ORDER — BENAZEPRIL HCL 10 MG PO TABS
10.0000 mg | ORAL_TABLET | Freq: Every day | ORAL | Status: DC
  Administered 2015-05-31 – 2015-06-01 (×2): 10 mg via ORAL
  Filled 2015-05-31 (×2): qty 1

## 2015-05-31 NOTE — Progress Notes (Signed)
Subjective: Pt doing well. Abd pain better  Objective: Vital signs in last 24 hours: Temp:  [98.2 F (36.8 C)] 98.2 F (36.8 C) (12/21 0516) Pulse Rate:  [67-112] 68 (12/21 0516) Resp:  [18-20] 18 (12/21 0516) BP: (139-174)/(62-79) 153/79 mmHg (12/21 0516) SpO2:  [95 %-98 %] 95 % (12/21 0516) Last BM Date: 05/30/15  Intake/Output from previous day: 12/20 0701 - 12/21 0700 In: 1550 [P.O.:480; I.V.:870; IV Piggyback:100] Out: 200 [Drains:200] Intake/Output this shift: Total I/O In: -  Out: 300 [Urine:300]  General appearance: alert and cooperative GI: soft, non-tender; bowel sounds normal; no masses,  no organomegaly and JP purulent appearing  Lab Results:   Recent Labs  05/29/15 0634 05/30/15 0600  WBC 11.6* 9.0  HGB 10.2* 10.3*  HCT 31.4* 31.5*  PLT 362 392   BMET  Recent Labs  05/30/15 0600 05/31/15 0553  NA 137 137  K 4.2 4.4  CL 104 105  CO2 20* 24  GLUCOSE 74 112*  BUN 9 <5*  CREATININE 0.64 0.47  CALCIUM 8.6* 8.2*   PT/INR No results for input(s): LABPROT, INR in the last 72 hours. ABG No results for input(s): PHART, HCO3 in the last 72 hours.  Invalid input(s): PCO2, PO2  Studies/Results: Ct Abdomen Pelvis Wo Contrast  05/29/2015  CLINICAL DATA:  Generalized abdominal pain. Recent placement of drainage catheter for pelvic abscess of presumed diverticular origin. EXAM: CT ABDOMEN AND PELVIS WITHOUT CONTRAST TECHNIQUE: Multidetector CT imaging of the abdomen and pelvis was performed following the standard protocol without IV contrast. COMPARISON:  Outside CT abdomen/pelvis [facility] 05/26/2015 as well as recent CT from placement of drainage catheter 05/27/2015. FINDINGS: Lung bases demonstrate a tiny amount of bilateral pleural fluid and bibasilar atelectasis. Abdominal images demonstrate mild free peritoneal air over the mid to right-sided anterior upper abdomen. This was not seen previously and may have been introduced at the time  of drainage catheter placement. Evidence of possible small gallstones versus sludge. Few small calcified liver and splenic granulomas. The pancreas, adrenal glands and stomach are within normal. Kidneys are normal in size without hydronephrosis or nephrolithiasis. Mild calcified plaque over the abdominal aorta. Lower abdominal/ pelvic images demonstrate evidence of patient's percutaneous pigtail drain entering the left lower quadrant from anterior approach with pigtail over the region of the cul-de-sac over the most posterior dependent portion of known abscess cavity. Uterus appears to be compressed anterior to the catheter with sigmoid colon just posterior to the catheter. There is been a marked decrease in size of the pelvic abscess with near resolution of the previously seen large fluid component. The component of the abscess cavity over the midline pelvis over the cul-de-sac is difficult to define and has nearly resolved. The component of the abscess over the anterior pelvis immediately above and anterior to the sigmoid colon is predominantly filled with air measuring 3.8 x 5.3 cm (previously 7.7 x 7.2 cm). There is decrease in size of a component of the abscess adjacent the left iliac vessels posterior to the sigmoid colon predominately fill with air measuring 1.4 x 2.4 cm (previously 2.4 x 4.5 cm). There is mild diverticulosis of the colon most notable over the sigmoid colon without significant inflammatory adjacent change. There is a mottled collection of extraluminal air adjacent to and just below the drainage catheter as it enters the left lower abdominal wall. Several small bowel loops are immediately lateral to this extraluminal air. Remaining pelvic images are notable for radiopaque ring device over the  region of the cervix. Rectum is within normal. No significant free fluid. Small bladder diverticulum. Degenerative changes of the spine and hips. Multilevel disc disease over the lumbar spine. IMPRESSION:  Diverticulosis of the colon. Significant interval decrease in size of patient's pelvic abscess of presumed diverticular origin. Pigtail drainage catheter over the posterior dependent portion of the abscess in the region of the cul-de-sac. Component of the abscess cavity in the region of the cul-de-sac adjacent the pigtail catheter has nearly resolved. Decreased size of the abscess component just anterior superior to the sigmoid colon measuring 3.8 x 5.3 cm (previously 7.7 x 7.2 cm) and decreased size of a left pelvic component measuring 1.4 x 2.4 cm (previously 2.4 x 4.5 cm). Mottled extraluminal air adjacent to and just below the entrance of the drainage catheter over the left lower abdominal wall with small amount of free peritoneal air over the anterior right upper abdomen. This air may have been introduced at the time of catheter placement and less likely small bowel injury at the entrance site in the left lower quadrant. Recommend attention on follow-up exam. Tiny bilateral pleural effusions with bibasilar dependent atelectasis. Possible gallbladder sludge versus small stones. Small bladder diverticulum. Electronically Signed   By: Elberta Fortis M.D.   On: 05/29/2015 16:12    Anti-infectives: Anti-infectives    Start     Dose/Rate Route Frequency Ordered Stop   05/27/15 0400  piperacillin-tazobactam (ZOSYN) IVPB 3.375 g     3.375 g 12.5 mL/hr over 240 Minutes Intravenous Every 8 hours 05/26/15 2043     05/26/15 2200  piperacillin-tazobactam (ZOSYN) IVPB 3.375 g  Status:  Discontinued     3.375 g 100 mL/hr over 30 Minutes Intravenous 3 times per day 05/26/15 2003 05/26/15 2042   05/26/15 1945  piperacillin-tazobactam (ZOSYN) IVPB 3.375 g     3.375 g 100 mL/hr over 30 Minutes Intravenous  Once 05/26/15 1936 05/26/15 2046      Assessment/Plan: Diverticulitis with abscess S/p IR drain placement 05/27/15 Mild transaminitits Hypertension CAD/hx of Takotsubo cardiomyopathy -  resolved Antibiotics: Day 7 Zosyn.  Will DC and transition to PO abx DVT: SCD/Lovenox  Adv to FLD and Soft in AM.  If tolerating PO in AM OK for DC home with drain in place.  Will need to come back to drain clinic and f/u CT as outpt [facility]   LOS: 5 days    Marigene Ehlers., [facility] 05/31/2015

## 2015-05-31 NOTE — Progress Notes (Signed)
Subjective: Diverticular abscess s/p perc drain 12/17 Feeling better Has started some clears.  Allergies: Codeine and Tetanus toxoids  Medications:  Current facility-administered medications:  .  acetaminophen (TYLENOL) tablet 650 mg, 650 mg, Oral, Q6H PRN **OR** acetaminophen (TYLENOL) suppository 650 mg, 650 mg, Rectal, Q6H PRN, Ron Parker, MD .  alum & mag hydroxide-simeth (MAALOX/MYLANTA) 200-200-20 MG/5ML suspension 30 mL, 30 mL, Oral, Q6H PRN, Ron Parker, MD .  atorvastatin (LIPITOR) tablet 80 mg, 80 mg, Oral, Daily, Maretta Bees, MD, 80 mg at 05/31/15 0955 .  carvedilol (COREG) tablet 12.5 mg, 12.5 mg, Oral, BID WC, Maretta Bees, MD, 12.5 mg at 05/31/15 0955 .  ciprofloxacin (CIPRO) tablet 500 mg, 500 mg, Oral, BID, Axel Filler, MD .  enoxaparin (LOVENOX) injection 40 mg, 40 mg, Subcutaneous, Q24H, Maretta Bees, MD, 40 mg at 05/31/15 0954 .  HYDROmorphone (DILAUDID) injection 0.5-1 mg, 0.5-1 mg, Intravenous, Q3H PRN, Ron Parker, MD, 1 mg at 05/27/15 1745 .  metroNIDAZOLE (FLAGYL) tablet 500 mg, 500 mg, Oral, 3 times per day, Axel Filler, MD, 500 mg at 05/31/15 0945 .  ondansetron (ZOFRAN) tablet 4 mg, 4 mg, Oral, Q6H PRN **OR** ondansetron (ZOFRAN) injection 4 mg, 4 mg, Intravenous, Q6H PRN, Ron Parker, MD .  potassium chloride SA (K-DUR,KLOR-CON) CR tablet 40 mEq, 40 mEq, Oral, Daily, Maretta Bees, MD, 40 mEq at 05/31/15 0955 .  sodium chloride 0.9 % 1,000 mL with potassium chloride 20 mEq infusion, , Intravenous, Continuous, Maretta Bees, MD, Last Rate: 75 mL/hr at 05/31/15 0318 .  sodium chloride 0.9 % injection 3 mL, 3 mL, Intravenous, Q12H, Ron Parker, MD, 3 mL at 05/31/15 0958    Vital Signs: BP 140/81 mmHg  Pulse 69  Temp(Src) 97.9 F (36.6 C) (Oral)  Resp 18  Ht 5' 4.5" (1.638 m)  Wt 133 lb 14.4 oz (60.737 kg)  BMI 22.64 kg/m2  SpO2 95%  Physical Exam  Abdominal:  LLQ drain intact,  site clean Abd soft Output purulent-feculent?    Imaging: Ct Abdomen Pelvis Wo Contrast  05/29/2015  CLINICAL DATA:  Generalized abdominal pain. Recent placement of drainage catheter for pelvic abscess of presumed diverticular origin. EXAM: CT ABDOMEN AND PELVIS WITHOUT CONTRAST TECHNIQUE: Multidetector CT imaging of the abdomen and pelvis was performed following the standard protocol without IV contrast. COMPARISON:  Outside CT abdomen/pelvis scan [facility] 05/26/2015 as well as recent CT from placement of drainage catheter 05/27/2015. FINDINGS: Lung bases demonstrate a tiny amount of bilateral pleural fluid and bibasilar atelectasis. Abdominal images demonstrate mild free peritoneal air over the mid to right-sided anterior upper abdomen. This was not seen previously and may have been introduced at the time of drainage catheter placement. Evidence of possible small gallstones versus sludge. Few small calcified liver and splenic granulomas. The pancreas, adrenal glands and stomach are within normal. Kidneys are normal in size without hydronephrosis or nephrolithiasis. Mild calcified plaque over the abdominal aorta. Lower abdominal/ pelvic images demonstrate evidence of patient's percutaneous pigtail drain entering the left lower quadrant from anterior approach with pigtail over the region of the cul-de-sac over the most posterior dependent portion of known abscess cavity. Uterus appears to be compressed anterior to the catheter with sigmoid colon just posterior to the catheter. There is been a marked decrease in size of the pelvic abscess with near resolution of the previously seen large fluid component. The component of the abscess cavity over the midline pelvis over  the cul-de-sac is difficult to define and has nearly resolved. The component of the abscess over the anterior pelvis immediately above and anterior to the sigmoid colon is predominantly filled with air measuring 3.8 x 5.3 cm  (previously 7.7 x 7.2 cm). There is decrease in size of a component of the abscess adjacent the left iliac vessels posterior to the sigmoid colon predominately fill with air measuring 1.4 x 2.4 cm (previously 2.4 x 4.5 cm). There is mild diverticulosis of the colon most notable over the sigmoid colon without significant inflammatory adjacent change. There is a mottled collection of extraluminal air adjacent to and just below the drainage catheter as it enters the left lower abdominal wall. Several small bowel loops are immediately lateral to this extraluminal air. Remaining pelvic images are notable for radiopaque ring device over the region of the cervix. Rectum is within normal. No significant free fluid. Small bladder diverticulum. Degenerative changes of the spine and hips. Multilevel disc disease over the lumbar spine. IMPRESSION: Diverticulosis of the colon. Significant interval decrease in size of patient's pelvic abscess of presumed diverticular origin. Pigtail drainage catheter over the posterior dependent portion of the abscess in the region of the cul-de-sac. Component of the abscess cavity in the region of the cul-de-sac adjacent the pigtail catheter has nearly resolved. Decreased size of the abscess component just anterior superior to the sigmoid colon measuring 3.8 x 5.3 cm (previously 7.7 x 7.2 cm) and decreased size of a left pelvic component measuring 1.4 x 2.4 cm (previously 2.4 x 4.5 cm). Mottled extraluminal air adjacent to and just below the entrance of the drainage catheter over the left lower abdominal wall with small amount of free peritoneal air over the anterior right upper abdomen. This air may have been introduced at the time of catheter placement and less likely small bowel injury at the entrance site in the left lower quadrant. Recommend attention on follow-up exam. Tiny bilateral pleural effusions with bibasilar dependent atelectasis. Possible gallbladder sludge versus small stones.  Small bladder diverticulum. Electronically Signed   By: Elberta Fortis M.D.   On: 05/29/2015 16:12   Ct Image Guided Drainage By Percutaneous Catheter  05/27/2015  CLINICAL DATA:  Left abdominal abscess EXAM: CT IMAGE GUIDED DRAINAGE BY PERCUTANEOUS CATHETER FLUOROSCOPY TIME:  None MEDICATIONS AND MEDICAL HISTORY: Versed 2 mg, Fentanyl 100 mcg. Additional Medications: None. ANESTHESIA/SEDATION: Moderate sedation time: 12 minutes CONTRAST:  None PROCEDURE: The procedure, risks, benefits, and alternatives were explained to the patient. Questions regarding the procedure were encouraged and answered. The patient understands and consents to the procedure. The lower abdomen was prepped with Betadine in a sterile fashion, and a sterile drape was applied covering the operative field. A sterile gown and sterile gloves were used for the procedure. Under CT guidance, an 18 gauge needle was inserted into the lower abdominal abscess and removed over an Amplatz wire. A 12 French dilator followed by a 12 [place] drain were inserted. It was looped and string fixed and sewn to the skin. Pus was aspirated. FINDINGS: Images document 30 French drain placement into a lower abdominal abscess. COMPLICATIONS: None IMPRESSION: Successful lower abdominal abscess drainage. Electronically Signed   By: Jolaine Click M.D.   On: 05/27/2015 14:09    Labs:  CBC:  Recent Labs  05/27/15 0555 05/28/15 0533 05/29/15 0634 05/30/15 0600  WBC 16.1* 11.6* 11.6* 9.0  HGB 10.9* 10.5* 10.2* 10.3*  HCT 31.9* 31.4* 31.4* 31.5*  PLT 345 327 362 392    COAGS:  Recent Labs  05/27/15 0555  INR 1.34    BMP:  Recent Labs  05/28/15 0533 05/29/15 0634 05/30/15 0600 05/31/15 0553  NA 136 138 137 137  K 3.1* 3.9 4.2 4.4  CL 103 105 104 105  CO2 24 20* 20* 24  GLUCOSE 92 64* 74 112*  BUN <5*  CALCIUM 8.2* 8.4* 8.6* 8.2*  CREATININE 0.69 0.70 0.64 0.47  GFRNONAA >60 >60 >60 >60  GFRAA >60 >60 >60 >60    LIVER FUNCTION  TESTS:  Recent Labs  05/26/15 1701 05/28/15 0533  BILITOT 1.9* 1.2  AST 91* 40  ALT 61* 38  ALKPHOS 123 90  PROT 7.4 5.4*  ALBUMIN 2.6* 1.7*    Assessment and Plan: Diverticular abscess s/p perc drain Still with infectious appearing output, possibly feculent Likely home with drain and outpt CT and drain injection Other plans per primary/[facility]  Signed: Brayton El 05/31/2015, 11:48 AM   I spent a total of 15 Minutes at the the patient's bedside AND on the patient's hospital floor or unit, greater than 50% of which was counseling/coordinating care for diverticular abscess drainage

## 2015-05-31 NOTE — Progress Notes (Addendum)
PATIENT DETAILS Name: Rita Harding Age: 81 y.o. Sex: female Date of Birth: [birth year] Admit Date: 05/26/2015 Admitting Physician Ron Parker, MD [id number] [name], MD  Subjective: Abd pain imroving-Tolerating clears  Assessment/Plan: Principal Problem: Colonic diverticular abscess with systemic inflammatory response syndrome: Improved with empiric Zosyn and placement of left lower quadrant drain. Repeat CT abdomen on 12/19, shows improvement in the size of the abscesses. Initially kept nothing by mouth, subsequently started on clear liquids-diet now advanced to soft.Abx narrowed to Cipro Leukocytosis significantly improved. Abscess culture-pan sensitive Escherichia coli.   Active Problems: Mildly elevated transaminitis: Resolved, Likely secondary to above  Hypokalemia: Likely secondary to GI loss, repleted  Hypertension: Blood pressure now creeping up, continue Coreg, add benazepril. Follow  History of CAD: Stable without any chest pain or shortness of breath. Resume antiplatelets in the next few days.  Dyslipidemia: Resume statins   History of Takotsubo cardiomyopathy: Per last outpatient cardiology note-this seems to have resolved. Euvolemic on exam, watch closely while on IV fluids  Disposition: Remain inpatient-home tomorrow if clinical improvement continues.  Antimicrobial agents  See below  Anti-infectives    Start     Dose/Rate Route Frequency Ordered Stop   05/31/15 1000  ciprofloxacin (CIPRO) tablet 500 mg     500 mg Oral 2 times daily 05/31/15 0938     05/31/15 0945  metroNIDAZOLE (FLAGYL) tablet 500 mg     500 mg Oral 3 times per day 05/31/15 0981     05/27/15 0400  piperacillin-tazobactam (ZOSYN) IVPB 3.375 g  Status:  Discontinued     3.375 g 12.5 mL/hr over 240 Minutes Intravenous Every 8 hours 05/26/15 2043 05/31/15 1132   05/26/15 2200  piperacillin-tazobactam (ZOSYN) IVPB 3.375 g  Status:  Discontinued     3.375 g 100  mL/hr over 30 Minutes Intravenous 3 times per day 05/26/15 2003 05/26/15 2042   05/26/15 1945  piperacillin-tazobactam (ZOSYN) IVPB 3.375 g     3.375 g 100 mL/hr over 30 Minutes Intravenous  Once 05/26/15 1936 05/26/15 2046      DVT Prophylaxis: Prophylactic Lovenox  Code Status: Full code   Family Communication None at bedside  Procedures: LLQ Drain placement 12/17 by IR  CONSULTS:  general surgery  Interventional radiology  Time spent 25 minutes-Greater than 50% of this time was spent in counseling, explanation of diagnosis, planning of further management, and coordination of care.  MEDICATIONS: Scheduled Meds: . atorvastatin  80 mg Oral Daily  . carvedilol  12.5 mg Oral BID WC  . ciprofloxacin  500 mg Oral BID  . enoxaparin (LOVENOX) injection  40 mg Subcutaneous Q24H  . metroNIDAZOLE  500 mg Oral 3 times per day  . potassium chloride  40 mEq Oral Daily  . sodium chloride  3 mL Intravenous Q12H   Continuous Infusions: . sodium chloride 0.9 % 1,000 mL with potassium chloride 20 mEq infusion 75 mL/hr at 05/31/15 0318   PRN Meds:.acetaminophen **OR** acetaminophen, alum & mag hydroxide-simeth, HYDROmorphone (DILAUDID) injection, ondansetron **OR** ondansetron (ZOFRAN) IV    PHYSICAL EXAM: Vital signs in last 24 hours: Filed Vitals:   05/31/15 0516 05/31/15 0954 05/31/15 1059 05/31/15 1330  BP: 153/79 166/81 140/81 138/66  Pulse: 68 68 69 73  Temp: 98.2 F (36.8 C)  97.9 F (36.6 C) 98.2 F (36.8 C)  TempSrc: Oral  Oral Oral  Resp: 18  18 20   Height:  Weight:      SpO2: 95%  95% 95%    Weight change:  Filed Weights   05/26/15 2226  Weight: 60.737 kg (133 lb 14.4 oz)   Body mass index is 22.64 kg/(m^2).   Gen Exam: Awake and alert with clear speech.   Neck: Supple, No JVD.   Chest: B/L Clear.No rales CVS: S1 S2 Regular, no murmurs.  Abdomen: soft, BS +, still mildly tender in the left lower quadrant, non distended. Left lower quadrant drain in  place Extremities: no edema, lower extremities warm to touch. Neurologic: Non Focal.   Skin: No Rash.   Wounds: N/A.    Intake/Output from previous day:  Intake/Output Summary (Last 24 hours) at 05/31/15 1500 Last data filed at 05/31/15 1458  Gross per 24 hour  Intake   1780 ml  Output    700 ml  Net   1080 ml     LAB RESULTS: CBC  Recent Labs Lab 05/26/15 1701 05/27/15 0555 05/28/15 0533 05/29/15 0634 05/30/15 0600  WBC 16.4* 16.1* 11.6* 11.6* 9.0  HGB 13.0 10.9* 10.5* 10.2* 10.3*  HCT 37.9 31.9* 31.4* 31.4* 31.5*  PLT 419* 345 327 362 392  MCV 91.3 91.7 92.6 93.7 94.0  MCH 31.3 31.3 31.0 30.4 30.7  MCHC 34.3 34.2 33.4 32.5 32.7  RDW 14.0 14.3 14.5 14.5 14.7    Chemistries   Recent Labs Lab 05/27/15 0555 05/28/15 0533 05/29/15 0634 05/30/15 0600 05/31/15 0553  NA 134* 136 138 137 137  K 3.1* 3.1* 3.9 4.2 4.4  CL 99* 103 105 104 105  CO2 23 24 20* 20* 24  GLUCOSE 82 92 64* 74 112*  BUN <5*  CREATININE 0.85 0.69 0.70 0.64 0.47  CALCIUM 8.3* 8.2* 8.4* 8.6* 8.2*    CBG: No results for input(s): GLUCAP in the last 168 hours.  GFR Estimated Creatinine Clearance: 50.3 mL/min (by C-G formula based on Cr of 0.47).  Coagulation profile  Recent Labs Lab 05/27/15 0555  INR 1.34    Cardiac Enzymes No results for input(s): CKMB, TROPONINI, MYOGLOBIN in the last 168 hours.  Invalid input(s): CK  Invalid input(s): POCBNP No results for input(s): DDIMER in the last 72 hours. No results for input(s): HGBA1C in the last 72 hours. No results for input(s): CHOL, HDL, LDLCALC, TRIG, CHOLHDL, LDLDIRECT in the last 72 hours. No results for input(s): TSH, T4TOTAL, T3FREE, THYROIDAB in the last 72 hours.  Invalid input(s): FREET3 No results for input(s): VITAMINB12, FOLATE, FERRITIN, TIBC, IRON, RETICCTPCT in the last 72 hours. No results for input(s): LIPASE, AMYLASE in the last 72 hours.  Urine Studies No results for input(s): UHGB, CRYS in  the last 72 hours.  Invalid input(s): UACOL, UAPR, USPG, UPH, UTP, UGL, UKET, UBIL, UNIT, UROB, ULEU, UEPI, UWBC, URBC, UBAC, CAST, UCOM, BILUA  MICROBIOLOGY: Recent Results (from the past 240 hour(s))  Culture, routine-abscess     Status: None   Collection Time: 05/27/15  2:47 PM  Result Value Ref Range Status   Specimen Description ABSCESS ABDOMEN  Final   Special Requests NONE  Final   Gram Stain   Final    ABUNDANT WBC PRESENT,BOTH PMN AND MONONUCLEAR NO SQUAMOUS EPITHELIAL CELLS SEEN NO ORGANISMS SEEN Performed at [facility]    Culture   Final    MODERATE ESCHERICHIA COLI Performed at [facility]    Report Status 05/31/2015 FINAL  Final   Organism ID, Bacteria ESCHERICHIA COLI  Final  Susceptibility   Escherichia coli - MIC*    AMPICILLIN <=2 SENSITIVE Sensitive     AMPICILLIN/SULBACTAM <=2 SENSITIVE Sensitive     CEFEPIME <=1 SENSITIVE Sensitive     CEFTAZIDIME <=1 SENSITIVE Sensitive     CEFTRIAXONE <=1 SENSITIVE Sensitive     CIPROFLOXACIN <=0.25 SENSITIVE Sensitive     GENTAMICIN <=1 SENSITIVE Sensitive     IMIPENEM <=0.25 SENSITIVE Sensitive     PIP/TAZO <=4 SENSITIVE Sensitive     TOBRAMYCIN <=1 SENSITIVE Sensitive     TRIMETH/SULFA Value in next row Resistant      >=320 RESISTANT(NOTE)    * MODERATE ESCHERICHIA COLI    RADIOLOGY STUDIES/RESULTS: Ct Abdomen Pelvis Wo Contrast  05/29/2015  CLINICAL DATA:  Generalized abdominal pain. Recent placement of drainage catheter for pelvic abscess of presumed diverticular origin. EXAM: CT ABDOMEN AND PELVIS WITHOUT CONTRAST TECHNIQUE: Multidetector CT imaging of the abdomen and pelvis was performed following the standard protocol without IV contrast. COMPARISON:  Outside CT abdomen/pelvis scan [facility] 05/26/2015 as well as recent CT from placement of drainage catheter 05/27/2015. FINDINGS: Lung bases demonstrate a tiny amount of bilateral pleural fluid and bibasilar atelectasis. Abdominal  images demonstrate mild free peritoneal air over the mid to right-sided anterior upper abdomen. This was not seen previously and may have been introduced at the time of drainage catheter placement. Evidence of possible small gallstones versus sludge. Few small calcified liver and splenic granulomas. The pancreas, adrenal glands and stomach are within normal. Kidneys are normal in size without hydronephrosis or nephrolithiasis. Mild calcified plaque over the abdominal aorta. Lower abdominal/ pelvic images demonstrate evidence of patient's percutaneous pigtail drain entering the left lower quadrant from anterior approach with pigtail over the region of the cul-de-sac over the most posterior dependent portion of known abscess cavity. Uterus appears to be compressed anterior to the catheter with sigmoid colon just posterior to the catheter. There is been a marked decrease in size of the pelvic abscess with near resolution of the previously seen large fluid component. The component of the abscess cavity over the midline pelvis over the cul-de-sac is difficult to define and has nearly resolved. The component of the abscess over the anterior pelvis immediately above and anterior to the sigmoid colon is predominantly filled with air measuring 3.8 x 5.3 cm (previously 7.7 x 7.2 cm). There is decrease in size of a component of the abscess adjacent the left iliac vessels posterior to the sigmoid colon predominately fill with air measuring 1.4 x 2.4 cm (previously 2.4 x 4.5 cm). There is mild diverticulosis of the colon most notable over the sigmoid colon without significant inflammatory adjacent change. There is a mottled collection of extraluminal air adjacent to and just below the drainage catheter as it enters the left lower abdominal wall. Several small bowel loops are immediately lateral to this extraluminal air. Remaining pelvic images are notable for radiopaque ring device over the region of the cervix. Rectum is within  normal. No significant free fluid. Small bladder diverticulum. Degenerative changes of the spine and hips. Multilevel disc disease over the lumbar spine. IMPRESSION: Diverticulosis of the colon. Significant interval decrease in size of patient's pelvic abscess of presumed diverticular origin. Pigtail drainage catheter over the posterior dependent portion of the abscess in the region of the cul-de-sac. Component of the abscess cavity in the region of the cul-de-sac adjacent the pigtail catheter has nearly resolved. Decreased size of the abscess component just anterior superior to the sigmoid colon measuring 3.8 x  5.3 cm (previously 7.7 x 7.2 cm) and decreased size of a left pelvic component measuring 1.4 x 2.4 cm (previously 2.4 x 4.5 cm). Mottled extraluminal air adjacent to and just below the entrance of the drainage catheter over the left lower abdominal wall with small amount of free peritoneal air over the anterior right upper abdomen. This air may have been introduced at the time of catheter placement and less likely small bowel injury at the entrance site in the left lower quadrant. Recommend attention on follow-up exam. Tiny bilateral pleural effusions with bibasilar dependent atelectasis. Possible gallbladder sludge versus small stones. Small bladder diverticulum. Electronically Signed   By: Elberta Fortis M.D.   On: 05/29/2015 16:12   Ct Image Guided Drainage By Percutaneous Catheter  05/27/2015  CLINICAL DATA:  Left abdominal abscess EXAM: CT IMAGE GUIDED DRAINAGE BY PERCUTANEOUS CATHETER FLUOROSCOPY TIME:  None MEDICATIONS AND MEDICAL HISTORY: Versed 2 mg, Fentanyl 100 mcg. Additional Medications: None. ANESTHESIA/SEDATION: Moderate sedation time: 12 minutes CONTRAST:  None PROCEDURE: The procedure, risks, benefits, and alternatives were explained to the patient. Questions regarding the procedure were encouraged and answered. The patient understands and consents to the procedure. The lower abdomen was  prepped with Betadine in a sterile fashion, and a sterile drape was applied covering the operative field. A sterile gown and sterile gloves were used for the procedure. Under CT guidance, an 18 gauge needle was inserted into the lower abdominal abscess and removed over an Amplatz wire. A 12 French dilator followed by a 12 [place] drain were inserted. It was looped and string fixed and sewn to the skin. Pus was aspirated. FINDINGS: Images document 96 French drain placement into a lower abdominal abscess. COMPLICATIONS: None IMPRESSION: Successful lower abdominal abscess drainage. Electronically Signed   By: Jolaine Click M.D.   On: 05/27/2015 14:09    Jeoffrey Massed, MD  [facility] Pager:[phone]  If 7PM-7AM, please contact night-coverage [email or web address] Password [email or web address] 05/31/2015, 3:00 PM   LOS: 5 days

## 2015-05-31 NOTE — Care Management Note (Signed)
Case Management Note  Patient Details  Name: Rita Harding MRN: [id number] Date of Birth: [birth year]  Subjective/Objective:    NCM spoke with daughter , Rita Harding, she states she likes [employer] which is a Holiday representative, and a Charity fundraiser will be working with patient.  NCM asked if she would like a HHRN which will be covered by Medicare, she states if medicare will pay for this she would like to get this until [employer] [facility] starts, she chose [facility], referral made to Lupita Leash with [facility] for [facility].  Lupita Leash states ususally with [employer] there is no co pay but she will check.                   Action/Plan:   Expected Discharge Date:                  Expected Discharge Plan:  Home w Home Health Services  In-House Referral:     Discharge planning Services  CM Consult  Post Acute Care Choice:  Home Health Choice offered to:  Adult Children  DME Arranged:    DME Agency:     HH Arranged:  RN HH Agency:  [facility]  Status of Service:  Completed, signed off  Medicare Important Message Given:  Yes Date Medicare IM Given:    Medicare IM give by:    Date Additional Medicare IM Given:    Additional Medicare Important Message give by:     If discussed at Long Length of Stay Meetings, dates discussed:    Additional Comments:  Leone Haven, RN 05/31/2015, 11:53 AM

## 2015-06-01 MED ORDER — HYDROCODONE-ACETAMINOPHEN 5-325 MG PO TABS: ORAL_TABLET | ORAL | Status: DC

## 2015-06-01 MED ORDER — CIPROFLOXACIN HCL 500 MG PO TABS: 500.0000 mg | ORAL_TABLET | Freq: Two times a day (BID) | ORAL | Status: DC

## 2015-06-01 MED ORDER — METRONIDAZOLE 500 MG PO TABS: 500.0000 mg | ORAL_TABLET | Freq: Three times a day (TID) | ORAL | Status: DC

## 2015-06-01 NOTE — Discharge Summary (Signed)
PATIENT DETAILS Name: Rita Harding Age: 81 y.o. Sex: female Date of Birth: [birth year] MRN: [id number]. Admitting Physician: Ron Parker, MD WUJ:[id number] [name], MD  Admit Date: 05/26/2015 Discharge date: 06/01/2015  Recommendations for Outpatient Follow-up:  1.  Please ensure follow-up with general surgery and interventional radiology. 2.  Patient being discharged with left lower quadrant drain. 3.  complete course of Cipro/Flagyl. 4.  Ensure follow-up with gastroenterology, when diverticula abscess has been completely treated for a elective outpatient colonoscopy. 5. Please repeat CBC/BMET at next visit  PRIMARY DISCHARGE DIAGNOSIS:  Principal Problem:   Colonic diverticular abscess Active Problems:   Coronary artery disease   Hypertension   Hyperlipidemia   Intestinal diverticular abscess   Leukocytosis   Abdominal pain   Nausea and vomiting   Elevated transaminase level   Hypoalbuminemia      PAST MEDICAL HISTORY: Past Medical History  Diagnosis Date  . Hypertension   . Hyperlipidemia   . MI (myocardial infarction) (HCC) 2007    Thought to be takotsubo cardiomyopathy  . Coronary artery disease 2007    non-critcal coronary single disease  . History of DVT (deep vein thrombosis)     "LLE"; BEEN OFF  COUMADIN SINCE Sept 2012  . Takotsubo cardiomyopathy 2007    Result with normal EF as OF 2013    DISCHARGE MEDICATIONS: Current Discharge Medication List    START taking these medications   Details  ciprofloxacin (CIPRO) 500 MG tablet Take 1 tablet (500 mg total) by mouth 2 (two) times daily. Qty: 28 tablet, Refills: 0    metroNIDAZOLE (FLAGYL) 500 MG tablet Take 1 tablet (500 mg total) by mouth every 8 (eight) hours. Qty: 42 tablet, Refills: 0      CONTINUE these medications which have CHANGED   Details  HYDROcodone-acetaminophen (NORCO/VICODIN) 5-325 MG tablet Take 1/2-1 tablets every 6 hours as needed for severe pain Qty: 20 tablet,  Refills: 0      CONTINUE these medications which have NOT CHANGED   Details  aspirin EC 81 MG tablet Take 81 mg by mouth daily.    atorvastatin (LIPITOR) 80 MG tablet Take 1 tablet (80 mg total) by mouth daily. Qty: 30 tablet, Refills: 11    benazepril (LOTENSIN) 10 MG tablet Take 10 mg by mouth daily.    carvedilol (COREG) 12.5 MG tablet Take 12.5 mg by mouth 2 (two) times daily with a meal.    cholecalciferol (VITAMIN D) 1000 UNITS tablet Take 1,000 Units by mouth daily.      STOP taking these medications     Influenza vac split quadrivalent PF (FLUARIX) 0.5 ML injection      pneumococcal 13-valent conjugate vaccine (PREVNAR 13) SUSP injection         ALLERGIES:   Allergies  Allergen Reactions  . Codeine Nausea And Vomiting  . Tetanus Toxoids Other (See Comments)    Swollen and red ( patient informed that she had to take it in three doses)    BRIEF HPI:  See H&P, Labs, Consult and Test reports for all details in brief, patient was admitted for evaluation of left lower quadrant pain-outpatient CT scan abdomen showed a diverticular abscess.  CONSULTATIONS:   general surgery and IR  PERTINENT RADIOLOGIC STUDIES: Ct Abdomen Pelvis Wo Contrast  05/29/2015  CLINICAL DATA:  Generalized abdominal pain. Recent placement of drainage catheter for pelvic abscess of presumed diverticular origin. EXAM: CT ABDOMEN AND PELVIS WITHOUT CONTRAST TECHNIQUE: Multidetector CT imaging of the abdomen and pelvis  was performed following the standard protocol without IV contrast. COMPARISON:  Outside CT abdomen/pelvis scan [facility] 05/26/2015 as well as recent CT from placement of drainage catheter 05/27/2015. FINDINGS: Lung bases demonstrate a tiny amount of bilateral pleural fluid and bibasilar atelectasis. Abdominal images demonstrate mild free peritoneal air over the mid to right-sided anterior upper abdomen. This was not seen previously and may have been introduced at the time of  drainage catheter placement. Evidence of possible small gallstones versus sludge. Few small calcified liver and splenic granulomas. The pancreas, adrenal glands and stomach are within normal. Kidneys are normal in size without hydronephrosis or nephrolithiasis. Mild calcified plaque over the abdominal aorta. Lower abdominal/ pelvic images demonstrate evidence of patient's percutaneous pigtail drain entering the left lower quadrant from anterior approach with pigtail over the region of the cul-de-sac over the most posterior dependent portion of known abscess cavity. Uterus appears to be compressed anterior to the catheter with sigmoid colon just posterior to the catheter. There is been a marked decrease in size of the pelvic abscess with near resolution of the previously seen large fluid component. The component of the abscess cavity over the midline pelvis over the cul-de-sac is difficult to define and has nearly resolved. The component of the abscess over the anterior pelvis immediately above and anterior to the sigmoid colon is predominantly filled with air measuring 3.8 x 5.3 cm (previously 7.7 x 7.2 cm). There is decrease in size of a component of the abscess adjacent the left iliac vessels posterior to the sigmoid colon predominately fill with air measuring 1.4 x 2.4 cm (previously 2.4 x 4.5 cm). There is mild diverticulosis of the colon most notable over the sigmoid colon without significant inflammatory adjacent change. There is a mottled collection of extraluminal air adjacent to and just below the drainage catheter as it enters the left lower abdominal wall. Several small bowel loops are immediately lateral to this extraluminal air. Remaining pelvic images are notable for radiopaque ring device over the region of the cervix. Rectum is within normal. No significant free fluid. Small bladder diverticulum. Degenerative changes of the spine and hips. Multilevel disc disease over the lumbar spine. IMPRESSION:  Diverticulosis of the colon. Significant interval decrease in size of patient's pelvic abscess of presumed diverticular origin. Pigtail drainage catheter over the posterior dependent portion of the abscess in the region of the cul-de-sac. Component of the abscess cavity in the region of the cul-de-sac adjacent the pigtail catheter has nearly resolved. Decreased size of the abscess component just anterior superior to the sigmoid colon measuring 3.8 x 5.3 cm (previously 7.7 x 7.2 cm) and decreased size of a left pelvic component measuring 1.4 x 2.4 cm (previously 2.4 x 4.5 cm). Mottled extraluminal air adjacent to and just below the entrance of the drainage catheter over the left lower abdominal wall with small amount of free peritoneal air over the anterior right upper abdomen. This air may have been introduced at the time of catheter placement and less likely small bowel injury at the entrance site in the left lower quadrant. Recommend attention on follow-up exam. Tiny bilateral pleural effusions with bibasilar dependent atelectasis. Possible gallbladder sludge versus small stones. Small bladder diverticulum. Electronically Signed   By: Elberta Fortis M.D.   On: 05/29/2015 16:12   Ct Image Guided Drainage By Percutaneous Catheter  05/27/2015  CLINICAL DATA:  Left abdominal abscess EXAM: CT IMAGE GUIDED DRAINAGE BY PERCUTANEOUS CATHETER FLUOROSCOPY TIME:  None MEDICATIONS AND MEDICAL HISTORY: Versed 2  mg, Fentanyl 100 mcg. Additional Medications: None. ANESTHESIA/SEDATION: Moderate sedation time: 12 minutes CONTRAST:  None PROCEDURE: The procedure, risks, benefits, and alternatives were explained to the patient. Questions regarding the procedure were encouraged and answered. The patient understands and consents to the procedure. The lower abdomen was prepped with Betadine in a sterile fashion, and a sterile drape was applied covering the operative field. A sterile gown and sterile gloves were used for the  procedure. Under CT guidance, an 18 gauge needle was inserted into the lower abdominal abscess and removed over an Amplatz wire. A 12 French dilator followed by a 12 [place] drain were inserted. It was looped and string fixed and sewn to the skin. Pus was aspirated. FINDINGS: Images document 61 French drain placement into a lower abdominal abscess. COMPLICATIONS: None IMPRESSION: Successful lower abdominal abscess drainage. Electronically Signed   By: Jolaine Click M.D.   On: 05/27/2015 14:09     PERTINENT LAB RESULTS: CBC:  Recent Labs  05/30/15 0600  WBC 9.0  HGB 10.3*  HCT 31.5*  PLT 392   CMET CMP     Component Value Date/Time   NA 137 05/31/2015 0553   K 4.4 05/31/2015 0553   CL 105 05/31/2015 0553   CO2 24 05/31/2015 0553   GLUCOSE 112* 05/31/2015 0553   BUN <5* 05/31/2015 0553   CREATININE 0.47 05/31/2015 0553   CALCIUM 8.2* 05/31/2015 0553   PROT 5.4* 05/28/2015 0533   ALBUMIN 1.7* 05/28/2015 0533   AST 40 05/28/2015 0533   ALT 38 05/28/2015 0533   ALKPHOS 90 05/28/2015 0533   BILITOT 1.2 05/28/2015 0533   GFRNONAA >60 05/31/2015 0553   GFRAA >60 05/31/2015 0553    GFR Estimated Creatinine Clearance: 50.3 mL/min (by C-G formula based on Cr of 0.47). No results for input(s): LIPASE, AMYLASE in the last 72 hours. No results for input(s): CKTOTAL, CKMB, CKMBINDEX, TROPONINI in the last 72 hours. Invalid input(s): POCBNP No results for input(s): DDIMER in the last 72 hours. No results for input(s): HGBA1C in the last 72 hours. No results for input(s): CHOL, HDL, LDLCALC, TRIG, CHOLHDL, LDLDIRECT in the last 72 hours. No results for input(s): TSH, T4TOTAL, T3FREE, THYROIDAB in the last 72 hours.  Invalid input(s): FREET3 No results for input(s): VITAMINB12, FOLATE, FERRITIN, TIBC, IRON, RETICCTPCT in the last 72 hours. Coags: No results for input(s): INR in the last 72 hours.  Invalid input(s): PT Microbiology: Recent Results (from the past 240 hour(s))    Culture, routine-abscess     Status: None   Collection Time: 05/27/15  2:47 PM  Result Value Ref Range Status   Specimen Description ABSCESS ABDOMEN  Final   Special Requests NONE  Final   Gram Stain   Final    ABUNDANT WBC PRESENT,BOTH PMN AND MONONUCLEAR NO SQUAMOUS EPITHELIAL CELLS SEEN NO ORGANISMS SEEN Performed at [facility]    Culture   Final    MODERATE ESCHERICHIA COLI Performed at [facility]    Report Status 05/31/2015 FINAL  Final   Organism ID, Bacteria ESCHERICHIA COLI  Final      Susceptibility   Escherichia coli - MIC*    AMPICILLIN <=2 SENSITIVE Sensitive     AMPICILLIN/SULBACTAM <=2 SENSITIVE Sensitive     CEFEPIME <=1 SENSITIVE Sensitive     CEFTAZIDIME <=1 SENSITIVE Sensitive     CEFTRIAXONE <=1 SENSITIVE Sensitive     CIPROFLOXACIN <=0.25 SENSITIVE Sensitive     GENTAMICIN <=1 SENSITIVE Sensitive  IMIPENEM <=0.25 SENSITIVE Sensitive     PIP/TAZO <=4 SENSITIVE Sensitive     TOBRAMYCIN <=1 SENSITIVE Sensitive     TRIMETH/SULFA Value in next row Resistant      >=320 RESISTANT(NOTE)    * MODERATE ESCHERICHIA COLI     BRIEF HOSPITAL COURSE:  Colonic diverticular abscess with systemic inflammatory response syndrome: Improved with empiric Zosyn and placement of left lower quadrant drain. Repeat CT abdomen on 12/19, shows improvement in the size of the abscesses. Initially kept nothing by mouth, subsequently started on clear liquids-diet now advanced to soft.Abx narrowed to Cipro/Flagyl.  Abscess culture-pan sensitive Escherichia coli.  Spoke with general surgery-Will Casimer Bilis- okay with the general surgery team for discharge today, recommendations are to continue with Cipro and Flagyl. They will arrange for outpatient follow-up and a repeat CT scan.  Active Problems: Mildly elevated transaminitis: Resolved, Likely secondary to above  Hypokalemia: Likely secondary to GI loss, repleted  Hypertension: controlled with Coreg and  benazepril. Further optimization deferred to the outpatient setting   History of CAD: Stable without any chest pain or shortness of breath. Resume antiplatelets discharge  Dyslipidemia: Resume statins   History of Takotsubo cardiomyopathy: Per last outpatient cardiology note-this seems to have resolved. Euvolemic on exam  TODAY-DAY OF DISCHARGE:  Subjective:   Fara Worthy today has no headache,no chest abdominal pain,no new weakness tingling or numbness, feels much better wants to go home today.  Objective:   Blood pressure 167/81, pulse 69, temperature 98.4 F (36.9 C), temperature source Oral, resp. rate 20, height 5' 4.5" (1.638 m), weight 60.737 kg (133 lb 14.4 oz), SpO2 97 %.  Intake/Output Summary (Last 24 hours) at 06/01/15 1427 Last data filed at 06/01/15 1351  Gross per 24 hour  Intake    840 ml  Output   1060 ml  Net   -220 ml   Filed Weights   05/26/15 2226  Weight: 60.737 kg (133 lb 14.4 oz)    Exam Awake Alert, Oriented *3, No new F.N deficits, Normal affect [place].AT,PERRAL Supple Neck,No JVD, No cervical lymphadenopathy appriciated.  Symmetrical Chest wall movement, Good air movement bilaterally, CTAB RRR,No Gallops,Rubs or new Murmurs, No Parasternal Heave +ve B.Sounds, Abd Soft, Non tender, No organomegaly appriciated, No rebound -guarding or rigidity. No Cyanosis, Clubbing or edema, No new Rash or bruise  DISCHARGE CONDITION: Stable  DISPOSITION: Home with home health services  DISCHARGE INSTRUCTIONS:    Activity:  As tolerated  Get Medicines reviewed and adjusted: Please take all your medications with you for your next visit with your Primary MD  Please request your Primary MD to go over all hospital tests and procedure/radiological results at the follow up, please ask your Primary MD to get all Hospital records sent to his/her office.  If you experience worsening of your admission symptoms, develop shortness of breath, life threatening  emergency, suicidal or homicidal thoughts you must seek medical attention immediately by calling 911 or calling your MD immediately  if symptoms less severe.  You must read complete instructions/literature along with all the possible adverse reactions/side effects for all the Medicines you take and that have been prescribed to you. Take any new Medicines after you have completely understood and accpet all the possible adverse reactions/side effects.   Do not drive when taking Pain medications.   Do not take more than prescribed Pain, Sleep and Anxiety Medications  Special Instructions: If you have smoked or chewed Tobacco  in the last 2 yrs please stop smoking, stop  any regular Alcohol  and or any Recreational drug use.  Wear Seat belts while driving.  Please note  You were cared for by a hospitalist during your hospital stay. Once you are discharged, your primary care physician will handle any further medical issues. Please note that NO REFILLS for any discharge medications will be authorized once you are discharged, as it is imperative that you return to your primary care physician (or establish a relationship with a primary care physician if you do not have one) for your aftercare needs so that they can reassess your need for medications and monitor your lab values.   Diet recommendation: Heart Healthy diet-soft diet   Discharge Instructions    Call MD for:  persistant nausea and vomiting    Complete by:  As directed      Call MD for:  temperature >100.4    Complete by:  As directed      Diet - low sodium heart healthy    Complete by:  As directed   Stay on soft diet     Increase activity slowly    Complete by:  As directed            Follow-up Information    Follow up with TSUEI,MATTHEW K., MD. Schedule an appointment as soon as possible for a visit in 2 weeks.   Specialty:  General Surgery   Why:  For wound re-check   Contact information:   [address] [place] [address] [phone]       Follow up with [facility].   Why:  HHRN  for drain care   Contact information:   [address] [place] [address] [phone]       Follow up with Gaspar Garbe, MD. Schedule an appointment as soon as possible for a visit in 1 week.   Specialty:  Internal Medicine   Why:  Hospital follow up   Contact information:   [address] [place] [address] [phone]       Follow up with Rodman Pickle, MD.   Specialty:  General Surgery   Why:  office will call you with a appointment-if you dont hear from them-please call the office    Contact information:   [address] [place] [address] [phone]      Total Time spent on discharge equals  45 minutes.  SignedJeoffrey Massed 06/01/2015 2:27 PM

## 2015-06-01 NOTE — Progress Notes (Signed)
Nsg Discharge Note  Admit Date:  05/26/2015 Discharge date: 06/01/2015   Rita Harding to be D/C'd Home per MD order.  AVS completed.  Copy for chart, and copy for patient signed, and dated. Patient/caregiver able to verbalize understanding.  Discharge Medication:   Medication List    STOP taking these medications        Influenza vac split quadrivalent PF 0.5 ML injection  Commonly known as:  FLUARIX     pneumococcal 13-valent conjugate vaccine Susp injection  Commonly known as:  PREVNAR 13      TAKE these medications        aspirin EC 81 MG tablet  Take 81 mg by mouth daily.     atorvastatin 80 MG tablet  Commonly known as:  LIPITOR  Take 1 tablet (80 mg total) by mouth daily.     benazepril 10 MG tablet  Commonly known as:  LOTENSIN  Take 10 mg by mouth daily.     carvedilol 12.5 MG tablet  Commonly known as:  COREG  Take 12.5 mg by mouth 2 (two) times daily with a meal.     cholecalciferol 1000 UNITS tablet  Commonly known as:  VITAMIN D  Take 1,000 Units by mouth daily.     ciprofloxacin 500 MG tablet  Commonly known as:  CIPRO  Take 1 tablet (500 mg total) by mouth 2 (two) times daily.     HYDROcodone-acetaminophen 5-325 MG tablet  Commonly known as:  NORCO/VICODIN  Take 1/2-1 tablets every 6 hours as needed for severe pain     metroNIDAZOLE 500 MG tablet  Commonly known as:  FLAGYL  Take 1 tablet (500 mg total) by mouth every 8 (eight) hours.        Discharge Assessment: Filed Vitals:   06/01/15 0523 06/01/15 1230  BP: 149/76 167/81  Pulse: 69 69  Temp: 98.5 F (36.9 C) 98.4 F (36.9 C)  Resp: 18 20  Skin clean, dry and intact without evidence of skin break down, no evidence of skin tears noted. IV catheter discontinued intact. Site without signs and symptoms of complications - no redness or edema noted at insertion site, patient denies c/o pain - only slight tenderness at site.  Dressing with slight pressure applied.  D/c  Instructions-Education: Discharge instructions given to patient/family with verbalized understanding. D/c education completed with patient/family including follow up instructions, medication list, d/c activities limitations if indicated, with other d/c instructions as indicated by MD - patient able to verbalize understanding, all questions fully answered. Patient instructed to return to ED, call 911, or call MD for any changes in condition.  Patient escorted via WC, and D/C home via private auto.  Camillo Flaming, RN 06/01/2015 5:09 PM

## 2015-06-01 NOTE — Progress Notes (Signed)
Physical Therapy Discharge Patient Details Name: Rita Harding MRN: [id number] DOB: 1933-10-18 Today's Date: 06/01/2015 Time:  -     Patient discharged from PT services secondary to Pt mobilizing without difficulty now..  Please see latest therapy progress note for current level of functioning and progress toward goals.    Progress and discharge plan discussed with patient and/or caregiver: Patient/Caregiver agrees with plan  GP     [facility] 06/01/2015, 2:55 PM  [facility] PT [phone]

## 2015-06-01 NOTE — Care Management Note (Signed)
Case Management Note  Patient Details  Name: Rita Harding MRN: [id number] Date of Birth: [birth year]  Subjective/Objective:   Patient for dc today, notified [facility] , for [facility] for drain care.                  Action/Plan:   Expected Discharge Date:                  Expected Discharge Plan:  Home w Home Health Services  In-House Referral:     Discharge planning Services  CM Consult  Post Acute Care Choice:  Home Health Choice offered to:  Adult Children  DME Arranged:    DME Agency:     HH Arranged:  RN HH Agency:  [facility]  Status of Service:  Completed, signed off  Medicare Important Message Given:  Yes Date Medicare IM Given:    Medicare IM give by:    Date Additional Medicare IM Given:    Additional Medicare Important Message give by:     If discussed at Long Length of Stay Meetings, dates discussed:    Additional Comments:  Leone Haven, RN 06/01/2015, 2:59 PM

## 2015-06-01 NOTE — Progress Notes (Signed)
  Subjective: She is tolerating the pills and the soft diet well.  Still has some purulent drainage in her drain bag.  Sore at site, but otherwise OK.    Objective: Vital signs in last 24 hours: Temp:  [97.9 F (36.6 C)-98.5 F (36.9 C)] 98.5 F (36.9 C) (12/22 0523) Pulse Rate:  [67-73] 69 (12/22 0523) Resp:  [18-20] 18 (12/22 0523) BP: (106-149)/(61-81) 149/76 mmHg (12/22 0523) SpO2:  [95 %-97 %] 97 % (12/22 0523) Last BM Date: 05/31/15 Afebrile VSS 10 CC from the drain. 240 PO recorded  Soft diet No labs today Intake/Output from previous day: 12/21 0701 - 12/22 0700 In: 1140 [P.O.:240; I.V.:900] Out: 1460 [Urine:1450; Drains:10] Intake/Output this shift:    General appearance: alert, cooperative and no distress GI: soft sore, site OK  Lab Results:   Recent Labs  05/30/15 0600  WBC 9.0  HGB 10.3*  HCT 31.5*  PLT 392    BMET  Recent Labs  05/30/15 0600 05/31/15 0553  NA 137 137  K 4.2 4.4  CL 104 105  CO2 20* 24  GLUCOSE 74 112*  BUN 9 <5*  CREATININE 0.64 0.47  CALCIUM 8.6* 8.2*   PT/INR No results for input(s): LABPROT, INR in the last 72 hours.   Recent Labs Lab 05/26/15 1701 05/28/15 0533  AST 91* 40  ALT 61* 38  ALKPHOS 123 90  BILITOT 1.9* 1.2  PROT 7.4 5.4*  ALBUMIN 2.6* 1.7*     Lipase     Component Value Date/Time   LIPASE 29 05/26/2015 1701     Studies/Results: No results found.  Medications: . atorvastatin  80 mg Oral Daily  . benazepril  10 mg Oral Daily  . carvedilol  12.5 mg Oral BID WC  . ciprofloxacin  500 mg Oral BID  . enoxaparin (LOVENOX) injection  40 mg Subcutaneous Q24H  . metroNIDAZOLE  500 mg Oral 3 times per day  . potassium chloride  40 mEq Oral Daily  . sodium chloride  3 mL Intravenous Q12H    Assessment/Plan Diverticulitis with abscess S/p IR drain placement 05/27/15 Mild transaminitits Hypertension CAD/hx of Takotsubo cardiomyopathy - resolved Antibiotics:  Zosyn 5 days, converted to  Cipro/Flagyl 2 days ago DVT: SCD/Lovenox  Plan:  I have talked with IR and they will arrange follow up with Outpatient CT.  I will have our office check with her tomorrow and see when she is going to have CT and then set her up with Dr. Sheliah Hatch for follow up after that in our clinic.          LOS: 6 days    Rita Harding 06/01/2015

## 2015-06-02 ENCOUNTER — Other Ambulatory Visit: Payer: Self-pay | Admitting: Physician Assistant

## 2015-06-02 DIAGNOSIS — K5720 Diverticulitis of large intestine with perforation and abscess without bleeding: Secondary | ICD-10-CM

## 2015-06-07 ENCOUNTER — Other Ambulatory Visit (HOSPITAL_COMMUNITY): Payer: Self-pay | Admitting: Interventional Radiology

## 2015-06-07 DIAGNOSIS — K5720 Diverticulitis of large intestine with perforation and abscess without bleeding: Secondary | ICD-10-CM

## 2015-06-08 ENCOUNTER — Other Ambulatory Visit (HOSPITAL_COMMUNITY): Payer: Self-pay | Admitting: Interventional Radiology

## 2015-06-08 DIAGNOSIS — K630 Abscess of intestine: Secondary | ICD-10-CM

## 2015-06-08 DIAGNOSIS — K5720 Diverticulitis of large intestine with perforation and abscess without bleeding: Secondary | ICD-10-CM

## 2015-06-13 ENCOUNTER — Ambulatory Visit (HOSPITAL_COMMUNITY)
Admission: RE | Admit: 2015-06-13 | Discharge: 2015-06-13 | Disposition: A | Discharge status: Home / Self Care | Payer: Medicare Other | Source: Ambulatory Visit | Attending: Physician Assistant | Admitting: Physician Assistant

## 2015-06-13 ENCOUNTER — Ambulatory Visit (HOSPITAL_COMMUNITY)
Admission: RE | Admit: 2015-06-13 | Discharge: 2015-06-13 | Disposition: A | Discharge status: Home / Self Care | Payer: Medicare Other | Source: Ambulatory Visit | Attending: Interventional Radiology | Admitting: Interventional Radiology

## 2015-06-13 DIAGNOSIS — Z4803 Encounter for change or removal of drains: Secondary | ICD-10-CM | POA: Insufficient documentation

## 2015-06-13 DIAGNOSIS — K5790 Diverticulosis of intestine, part unspecified, without perforation or abscess without bleeding: Secondary | ICD-10-CM | POA: Insufficient documentation

## 2015-06-13 DIAGNOSIS — K630 Abscess of intestine: Secondary | ICD-10-CM

## 2015-06-13 DIAGNOSIS — K5720 Diverticulitis of large intestine with perforation and abscess without bleeding: Secondary | ICD-10-CM

## 2015-06-13 MED ORDER — IOHEXOL 300 MG/ML  SOLN
50.0000 mL | Freq: Once | INTRAMUSCULAR | Status: AC | PRN
  Administered 2015-06-13: 10 mL

## 2015-06-13 MED ORDER — IOHEXOL 300 MG/ML  SOLN
80.0000 mL | Freq: Once | INTRAMUSCULAR | Status: AC | PRN
  Administered 2015-06-13: 100 mL via INTRAVENOUS

## 2015-07-03 ENCOUNTER — Other Ambulatory Visit (HOSPITAL_COMMUNITY): Payer: Self-pay | Admitting: Interventional Radiology

## 2015-07-03 ENCOUNTER — Other Ambulatory Visit: Payer: Self-pay | Admitting: General Surgery

## 2015-07-03 ENCOUNTER — Other Ambulatory Visit (HOSPITAL_COMMUNITY): Payer: Self-pay | Admitting: Diagnostic Radiology

## 2015-07-03 DIAGNOSIS — K5720 Diverticulitis of large intestine with perforation and abscess without bleeding: Secondary | ICD-10-CM

## 2015-07-06 ENCOUNTER — Ambulatory Visit
Admission: RE | Admit: 2015-07-06 | Discharge: 2015-07-06 | Disposition: A | Discharge status: Home / Self Care | Payer: Medicare Other | Source: Ambulatory Visit | Attending: Interventional Radiology | Admitting: Interventional Radiology

## 2015-07-06 ENCOUNTER — Other Ambulatory Visit (HOSPITAL_COMMUNITY): Payer: Self-pay | Admitting: Diagnostic Radiology

## 2015-07-06 ENCOUNTER — Other Ambulatory Visit: Payer: Self-pay | Admitting: General Surgery

## 2015-07-06 ENCOUNTER — Ambulatory Visit
Admission: RE | Admit: 2015-07-06 | Discharge: 2015-07-06 | Disposition: A | Discharge status: Home / Self Care | Payer: Medicare Other | Source: Ambulatory Visit | Attending: General Surgery | Admitting: General Surgery

## 2015-07-06 DIAGNOSIS — K5720 Diverticulitis of large intestine with perforation and abscess without bleeding: Secondary | ICD-10-CM

## 2015-07-20 ENCOUNTER — Ambulatory Visit
Admission: RE | Admit: 2015-07-20 | Discharge: 2015-07-20 | Disposition: A | Discharge status: Home / Self Care | Payer: Medicare Other | Source: Ambulatory Visit | Attending: General Surgery | Admitting: General Surgery

## 2015-07-20 ENCOUNTER — Ambulatory Visit
Admission: RE | Admit: 2015-07-20 | Discharge: 2015-07-20 | Disposition: A | Discharge status: Home / Self Care | Payer: Medicare Other | Source: Ambulatory Visit | Attending: Diagnostic Radiology | Admitting: Diagnostic Radiology

## 2015-07-20 DIAGNOSIS — K651 Peritoneal abscess: Secondary | ICD-10-CM | POA: Diagnosis not present

## 2015-07-20 DIAGNOSIS — K5720 Diverticulitis of large intestine with perforation and abscess without bleeding: Secondary | ICD-10-CM

## 2015-07-20 NOTE — Progress Notes (Signed)
Interventional Radiology Progress Note   Rita Harding is a 81 yo female with a history of diverticulitis and abscess drainage with percutaneous pigtail catheter placement, 05/27/2015.    She presents for scheduled follow up and drain injection.  Interval CT performed 06/13/2015 shows near complete resolution of fluid at the catheter, and a drain injection performed 07/06/2015 demonstrated a small fistula.    Today's drain injection shows no residual abscess, and no definite residual fistula.  There is, of note, a fracture of the external portion of the drain at the location of the Stat-Lock.    The patient reports no significant, measurable drainage into the bag, and no fevers/rigors/chills.    The damaged drain was removed in its entirety at the bedside.   I encouraged her to follow up with all of her scheduled appointments.    Signed,  Yvone Neu. Loreta Ave, DO

## 2015-08-09 DIAGNOSIS — K5780 Diverticulitis of intestine, part unspecified, with perforation and abscess without bleeding: Secondary | ICD-10-CM | POA: Diagnosis not present

## 2015-09-06 DIAGNOSIS — K5730 Diverticulosis of large intestine without perforation or abscess without bleeding: Secondary | ICD-10-CM | POA: Diagnosis not present

## 2015-10-03 DIAGNOSIS — K5732 Diverticulitis of large intestine without perforation or abscess without bleeding: Secondary | ICD-10-CM | POA: Diagnosis not present

## 2015-10-03 DIAGNOSIS — K5730 Diverticulosis of large intestine without perforation or abscess without bleeding: Secondary | ICD-10-CM | POA: Diagnosis not present

## 2015-10-25 DIAGNOSIS — Z01419 Encounter for gynecological examination (general) (routine) without abnormal findings: Secondary | ICD-10-CM | POA: Diagnosis not present

## 2015-10-25 DIAGNOSIS — Z124 Encounter for screening for malignant neoplasm of cervix: Secondary | ICD-10-CM | POA: Diagnosis not present

## 2015-10-25 DIAGNOSIS — Z6822 Body mass index (BMI) 22.0-22.9, adult: Secondary | ICD-10-CM | POA: Diagnosis not present

## 2015-12-25 DIAGNOSIS — K529 Noninfective gastroenteritis and colitis, unspecified: Secondary | ICD-10-CM | POA: Diagnosis not present

## 2015-12-25 DIAGNOSIS — I959 Hypotension, unspecified: Secondary | ICD-10-CM | POA: Diagnosis not present

## 2015-12-25 DIAGNOSIS — Z6821 Body mass index (BMI) 21.0-21.9, adult: Secondary | ICD-10-CM | POA: Diagnosis not present

## 2015-12-28 ENCOUNTER — Telehealth: Payer: Self-pay | Admitting: Cardiology

## 2016-01-05 ENCOUNTER — Ambulatory Visit: Payer: Medicare Other | Admitting: Physician Assistant

## 2016-01-17 DIAGNOSIS — R109 Unspecified abdominal pain: Secondary | ICD-10-CM | POA: Diagnosis not present

## 2016-01-17 DIAGNOSIS — Z6821 Body mass index (BMI) 21.0-21.9, adult: Secondary | ICD-10-CM | POA: Diagnosis not present

## 2016-01-17 DIAGNOSIS — T148 Other injury of unspecified body region: Secondary | ICD-10-CM | POA: Diagnosis not present

## 2016-01-17 DIAGNOSIS — R630 Anorexia: Secondary | ICD-10-CM | POA: Diagnosis not present

## 2016-01-17 DIAGNOSIS — R5381 Other malaise: Secondary | ICD-10-CM | POA: Diagnosis not present

## 2016-01-17 DIAGNOSIS — K529 Noninfective gastroenteritis and colitis, unspecified: Secondary | ICD-10-CM | POA: Diagnosis not present

## 2016-04-18 DIAGNOSIS — N390 Urinary tract infection, site not specified: Secondary | ICD-10-CM | POA: Diagnosis not present

## 2016-04-18 DIAGNOSIS — R8299 Other abnormal findings in urine: Secondary | ICD-10-CM | POA: Diagnosis not present

## 2016-04-18 DIAGNOSIS — I10 Essential (primary) hypertension: Secondary | ICD-10-CM | POA: Diagnosis not present

## 2016-04-18 DIAGNOSIS — E7800 Pure hypercholesterolemia, unspecified: Secondary | ICD-10-CM | POA: Diagnosis not present

## 2016-04-22 DIAGNOSIS — I1310 Hypertensive heart and chronic kidney disease without heart failure, with stage 1 through stage 4 chronic kidney disease, or unspecified chronic kidney disease: Secondary | ICD-10-CM | POA: Diagnosis not present

## 2016-04-22 DIAGNOSIS — I10 Essential (primary) hypertension: Secondary | ICD-10-CM | POA: Diagnosis not present

## 2016-04-22 DIAGNOSIS — N182 Chronic kidney disease, stage 2 (mild): Secondary | ICD-10-CM | POA: Diagnosis not present

## 2016-04-22 DIAGNOSIS — E7800 Pure hypercholesterolemia, unspecified: Secondary | ICD-10-CM | POA: Diagnosis not present

## 2016-04-22 DIAGNOSIS — Z0000 Encounter for general adult medical examination without abnormal findings: Secondary | ICD-10-CM | POA: Diagnosis not present

## 2016-04-22 DIAGNOSIS — I2510 Atherosclerotic heart disease of native coronary artery without angina pectoris: Secondary | ICD-10-CM | POA: Diagnosis not present

## 2016-04-22 DIAGNOSIS — I252 Old myocardial infarction: Secondary | ICD-10-CM | POA: Diagnosis not present

## 2016-04-22 DIAGNOSIS — Z6821 Body mass index (BMI) 21.0-21.9, adult: Secondary | ICD-10-CM | POA: Diagnosis not present

## 2016-04-22 DIAGNOSIS — K5730 Diverticulosis of large intestine without perforation or abscess without bleeding: Secondary | ICD-10-CM | POA: Diagnosis not present

## 2016-04-22 DIAGNOSIS — R808 Other proteinuria: Secondary | ICD-10-CM | POA: Diagnosis not present

## 2016-05-08 DIAGNOSIS — Z23 Encounter for immunization: Secondary | ICD-10-CM | POA: Diagnosis not present

## 2016-11-15 DIAGNOSIS — Z1231 Encounter for screening mammogram for malignant neoplasm of breast: Secondary | ICD-10-CM | POA: Diagnosis not present

## 2016-11-15 DIAGNOSIS — Z803 Family history of malignant neoplasm of breast: Secondary | ICD-10-CM | POA: Diagnosis not present

## 2016-11-28 ENCOUNTER — Ambulatory Visit (INDEPENDENT_AMBULATORY_CARE_PROVIDER_SITE_OTHER): Payer: Medicare Other | Admitting: Cardiology

## 2016-11-28 ENCOUNTER — Encounter: Payer: Self-pay | Admitting: Cardiology

## 2016-11-28 ENCOUNTER — Encounter (INDEPENDENT_AMBULATORY_CARE_PROVIDER_SITE_OTHER): Payer: Self-pay

## 2016-11-28 VITALS — BP 146/97 | HR 76 | Ht 63.0 in | Wt 130.0 lb

## 2016-11-28 DIAGNOSIS — I2510 Atherosclerotic heart disease of native coronary artery without angina pectoris: Secondary | ICD-10-CM | POA: Diagnosis not present

## 2016-11-28 DIAGNOSIS — I5181 Takotsubo syndrome: Secondary | ICD-10-CM

## 2016-11-28 DIAGNOSIS — I10 Essential (primary) hypertension: Secondary | ICD-10-CM | POA: Diagnosis not present

## 2016-11-28 NOTE — Patient Instructions (Signed)
NO CHANGE WITH MEDICATIONS    Your physician wants you to follow-up in 12 MONTHS WITH DR HARDING.You will receive a reminder letter in the mail two months in advance. If you don't receive a letter, please call our office to schedule the follow-up appointment.  

## 2016-11-28 NOTE — Progress Notes (Signed)
PCP: Gaspar Garbe, MD  Clinic Note: Chief Complaint  Patient presents with  . New Evaluation    pt hasnt been here in a few years. just needs to follow up   . Cardiomyopathy    History of resolved Takotsubo cardiomyopathy    HPI: Rita Harding is a 83 y.o. female who is being seen today To reestablish cardiology care at the request of Tisovec, Adelfa Koh, MD. She is a former patient of Dr. Donia Guiles before that Dr. Shirlee Latch) with a history of likely nonischemic cardiomyopathy dating back to 2007. Presumably she had an episode of Takotsubo cardiomyopathy. Cardiac cath showed 50-60% LAD lesion with some bridging, otherwise normal coronaries. By September 2013 she had an echo demonstrating EF of back to 70%.  [name] was last seen on 09/20/2013 - asymptomatic with no major complaints. No heart failure symptoms.  Recent Hospitalizations: None  Studies Personally Reviewed - (if available, images/films reviewed: From Epic Chart or Care Everywhere)  No new studies (echocardiogram and Myoview results noted and past medical history/surgical history.  Interval History: Rita Harding returns today again doing quite well. She states that her husband died last year after long bout with progressive dementia. Since then she is actually starting to try to get back into an exercise regimen. She is doing more exercise and activities and she is out and about more than she was because she no longer has her husband as her responsibility. Since I last saw her, she continues to be stable without any symptoms of rest or exertional dyspnea. No chest tightness or pressure with rest or exertion. No PND or orthopnea edema. Nothing to suggest recurrence of cardiomyopathy. Her blood pressure has been borderline over last couple years, and she remains on her beta blocker and ACE inhibitor. She was just started on a statin by her PCP.  She denies any rapid irregular heartbeats palpitations,  lightheadedness, dizziness, weakness or syncope/near syncope. No TIA/amaurosis fugax symptoms.  No claudication.  ROS: A comprehensive was performed. Review of Systems  Constitutional: Negative for malaise/fatigue and weight loss.  Respiratory: Negative for shortness of breath.   Gastrointestinal: Negative for blood in stool and melena.  Genitourinary: Negative for hematuria.  Musculoskeletal: Positive for joint pain.  Neurological: Negative for dizziness.  Endo/Heme/Allergies: Positive for environmental allergies.  Psychiatric/Behavioral: Negative for memory loss. The patient is not nervous/anxious and does not have insomnia.        She seems to be handling her husbands death quite well with no significant depression.  All other systems reviewed and are negative.   I have reviewed and (if needed) personally updated the patient's problem list, medications, allergies, past medical and surgical history, social and family history.   Past Medical History:  Diagnosis Date  . Coronary artery disease 2007   non-critcal coronary single disease  . History of DVT (deep vein thrombosis)    "LLE"; BEEN OFF  COUMADIN SINCE Sept 2012  . Hyperlipidemia   . Hypertension   . MI (myocardial infarction) (HCC) 2007   Thought to be takotsubo cardiomyopathy  . Takotsubo cardiomyopathy 2007   Result with normal EF as OF 2013    Past Surgical History:  Procedure Laterality Date  . APPENDECTOMY    . CARDIAC CATHETERIZATION  01/03/2006    50-60% LAD with some myocardial  bridging; NON ISCHEMIC  CARDIOMYOPATHY  with EF  30 to 35% no evidence of infarct -- anteroapical and inferapical wall motin abnormalities compatilble with Takotsubo-type syndrome  .  DOPPLER LEFT LOWER EXTREMITY ([facility] HX) Left 01/29/2011   left saphenous vein -small amt of chroinc appearing calicification on anterior wall that was nonobstructing,mildly abnormal   . DOPPLER RIGHT ADDITIONAL EXTR ([facility] HX) Right 01/29/2011  . FIBULA  FRACTURE SURGERY Left 2009   UNDER CARE DR Beverely Low  . FRACTURE SURGERY    . NM MYOVIEW LTD  02/21/2012   low risk  and normal EF 70% or greater  . TONSILLECTOMY    . TRANSTHORACIC ECHOCARDIOGRAM  07/31/2010   no MVP ,NORMAL SYSTOLIC FUNCTION (EF >55%). Mild Ao Sclerosis.      Current Meds  Medication Sig  . aspirin EC 81 MG tablet Take 81 mg by mouth daily.  . benazepril (LOTENSIN) 10 MG tablet Take 10 mg by mouth daily.  . carvedilol (COREG) 12.5 MG tablet Take 12.5 mg by mouth 2 (two) times daily with a meal.    Allergies  Allergen Reactions  . Codeine Nausea And Vomiting  . Tetanus Toxoids Other (See Comments)    Swollen and red ( patient informed that she had to take it in three doses)    Social History   Social History  . Marital status: Married    Spouse name: N/A  . Number of children: N/A  . Years of education: N/A   Social History Main Topics  . Smoking status: Former Smoker    Packs/day: 1.00    Years: 15.00    Types: Cigarettes  . Smokeless tobacco: Never Used     Comment: "quit smoking in the 1960s"  . Alcohol use Yes     Comment: 05/27/2015 "nothing in 1 month; I drank a couple glasses of wine/day before that"  . Drug use: No  . Sexual activity: No   Other Topics Concern  . None   Social History Narrative   Married mother of 3 with 5 grandchildren. Quit smoking over 30 years ago.   Goes with her husband, who is a former major Careers information officer, and also in a patient of mine with progression of dementia. 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   She continues to be active, but is no longer walking as frequent as she used to. She still does work around the yard and house.    family history includes Cancer in her maternal grandmother and mother; Heart attack in her father; Heart disease in her paternal grandfather and paternal grandmother.  Wt Readings from Last 3 Encounters:  11/28/16 130 lb (59 kg)  05/26/15  133 lb 14.4 oz (60.7 kg)  09/20/13 137 lb 3.2 oz (62.2 kg)    PHYSICAL EXAM BP (!) 146/97 (BP Location: Left Arm, Patient Position: Sitting, Cuff Size: Normal)   Pulse 76   Ht 5\' 3"  (1.6 m)   Wt 130 lb (59 kg)   BMI 23.03 kg/m  General appearance: alert, cooperative, appears stated age, no distress. Well-nourished, well-groomed. HEENT: [place]/AT, EOMI, MMM, anicteric sclera Neck: no adenopathy, no carotid bruit and no JVD Lungs: clear to auscultation bilaterally, normal percussion bilaterally and non-labored Heart: RRR, normal S1 and split S2. 2/6 SEM at RUSB. No rubs or gallops. Nondisplaced PMI. Abdomen: soft, non-tender; bowel sounds normal; no masses,  no organomegaly; no HJR Extremities: extremities normal, atraumatic, no Clubbing or cyanosis, - trace L> R ankle swelling (she indicated prior injury & surgery of that ankle). Pulses: 2+ and symmetric;  Skin: mobility and turgor normal, no eczematous changes, no edema and no evidence of bleeding  or bruising or  Neurologic: Mental status: Alert & oriented x 3, thought content appropriate; non-focal exam.  Pleasant mood & affect.    Adult ECG Report  Rate: 75 ;  Rhythm: normal sinus rhythm, premature ventricular contractions (PVC) and L axis deviation & borderline LVH;   Narrative Interpretation: Stable EKG   Other studies Reviewed: Additional studies/ records that were reviewed today include:  Recent Labs:  N/A   ASSESSMENT / PLAN: Problem List Items Addressed This Visit    Coronary artery disease (Chronic)    Moderate, nonobstructive disease most notably in the LAD from distant. No anginal symptoms with rest or exertion. She is on a beta blocker and ACE inhibitor as well as aspirin. She was also started on a lipid control medicine (she does not remember what medicine is).  With no active angina, we will just simply continue risk factor management.      Hypertension (Chronic)    Blood pressures a little bit high today. This is  somewhat unusual for her. She states this usually better. Plan for now we'll defer management to her PCP who sees more frequently. Would probably not titrate carvedilol further for fear of bradycardia. May want to consider low-dose calcium channel blocker such as amlodipine for additional blood pressure control if necessary. I would try to avoid diuretic if possible to avoid electrolyte abnormalities.       Relevant Orders   EKG 12-Lead (Completed)   Takotsubo cardiomyopathy - Primary    Resolved stress-induced cardiomyopathy by recent echocardiogram in 2012. No recurrent heart failure symptoms on carvedilol, and benazepril. Euvolemic on exam. Not on standing diuretic. Her spironolactone was stopped back in 2015.      Relevant Orders   EKG 12-Lead (Completed)      Current medicines are reviewed at length with the patient today. (+/- concerns) N/A The following changes have been made: N/A  Patient Instructions  NO CHANGE WITH MEDICATIONS    Your physician wants you to follow-up in 12 MONTHS WITH DR HARDING. You will receive a reminder letter in the mail two months in advance. If you don't receive a letter, please call our office to schedule the follow-up appointment.    Studies Ordered:   Orders Placed This Encounter  Procedures  . EKG 12-Lead      Bryan Lemma, M.D., M.S. Interventional Cardiologist   Pager # [phone] Phone # [phone] [address] [place], [place] [address]

## 2016-11-30 ENCOUNTER — Encounter: Payer: Self-pay | Admitting: Cardiology

## 2016-11-30 NOTE — Assessment & Plan Note (Signed)
Blood pressures a little bit high today. This is somewhat unusual for her. She states this usually better. Plan for now we'll defer management to her PCP who sees more frequently. Would probably not titrate carvedilol further for fear of bradycardia. May want to consider low-dose calcium channel blocker such as amlodipine for additional blood pressure control if necessary. I would try to avoid diuretic if possible to avoid electrolyte abnormalities.

## 2016-11-30 NOTE — Assessment & Plan Note (Signed)
Resolved stress-induced cardiomyopathy by recent echocardiogram in 2012. No recurrent heart failure symptoms on carvedilol, and benazepril. Euvolemic on exam. Not on standing diuretic. Her spironolactone was stopped back in 2015.

## 2016-11-30 NOTE — Assessment & Plan Note (Signed)
Moderate, nonobstructive disease most notably in the LAD from distant. No anginal symptoms with rest or exertion. She is on a beta blocker and ACE inhibitor as well as aspirin. She was also started on a lipid control medicine (she does not remember what medicine is).  With no active angina, we will just simply continue risk factor management.

## 2017-03-25 DIAGNOSIS — Z23 Encounter for immunization: Secondary | ICD-10-CM | POA: Diagnosis not present

## 2017-04-03 NOTE — Telephone Encounter (Signed)
No additional notes

## 2017-04-21 DIAGNOSIS — E7800 Pure hypercholesterolemia, unspecified: Secondary | ICD-10-CM | POA: Diagnosis not present

## 2017-04-21 DIAGNOSIS — R82998 Other abnormal findings in urine: Secondary | ICD-10-CM | POA: Diagnosis not present

## 2017-04-21 DIAGNOSIS — N182 Chronic kidney disease, stage 2 (mild): Secondary | ICD-10-CM | POA: Diagnosis not present

## 2017-04-28 DIAGNOSIS — Z0000 Encounter for general adult medical examination without abnormal findings: Secondary | ICD-10-CM | POA: Diagnosis not present

## 2017-04-28 DIAGNOSIS — I1310 Hypertensive heart and chronic kidney disease without heart failure, with stage 1 through stage 4 chronic kidney disease, or unspecified chronic kidney disease: Secondary | ICD-10-CM | POA: Diagnosis not present

## 2017-04-28 DIAGNOSIS — N182 Chronic kidney disease, stage 2 (mild): Secondary | ICD-10-CM | POA: Diagnosis not present

## 2017-04-28 DIAGNOSIS — M5430 Sciatica, unspecified side: Secondary | ICD-10-CM | POA: Diagnosis not present

## 2017-12-11 IMAGING — CT CT IMAGE GUIDED DRAINAGE BY PERCUTANEOUS CATHETER
1 of 2 series · 15 of 32 positions shown, 19 images · non-contrast
Comparison: none

CLINICAL DATA: Left abdominal abscess

[Series 2: i-spiral 5.0 b40f · axial · 0.84mm/px · z∈[+1049,+1290]mm · 15 of 77 slices shown, 19 images]
[im 4/77  soft-tissue]
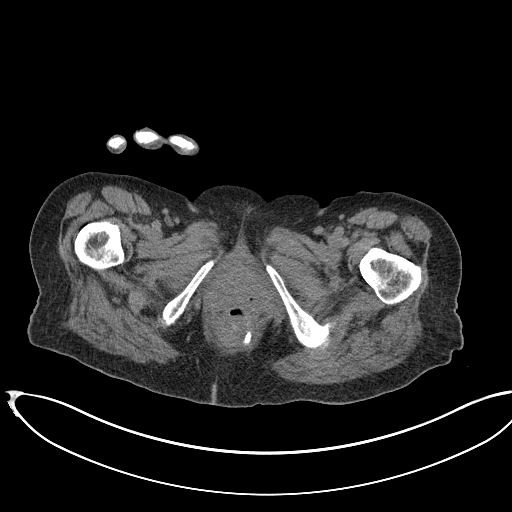
[im 4/77  bone]
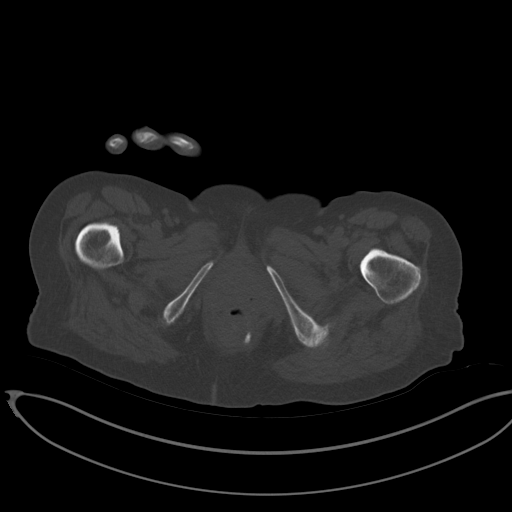
[im 10/77  soft-tissue]
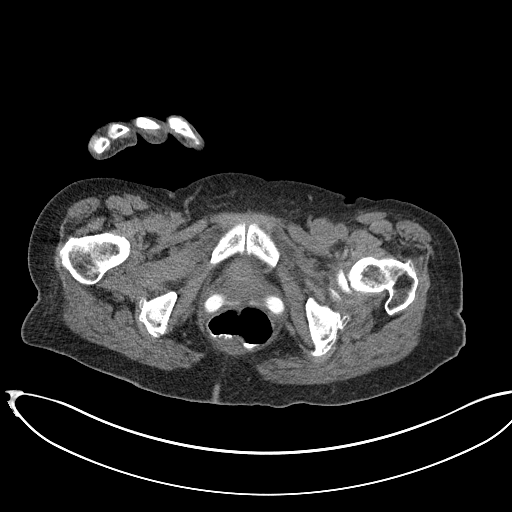
[im 16/77  soft-tissue]
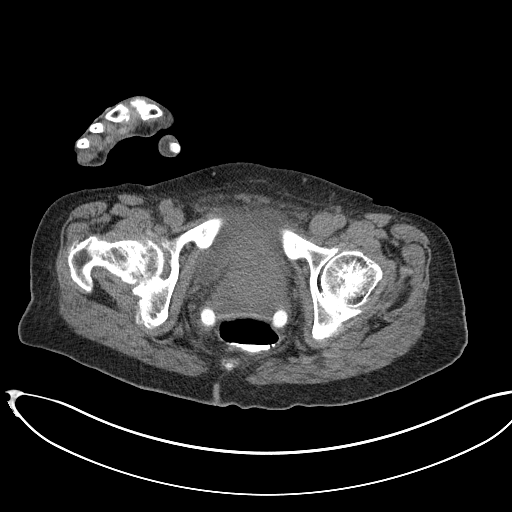
[im 23/77  soft-tissue]
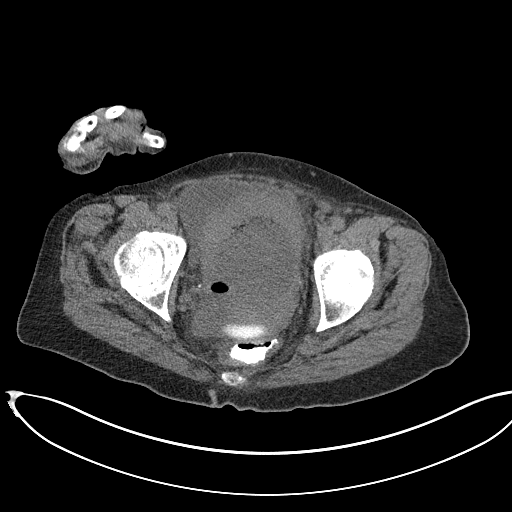
[im 26/77  soft-tissue]
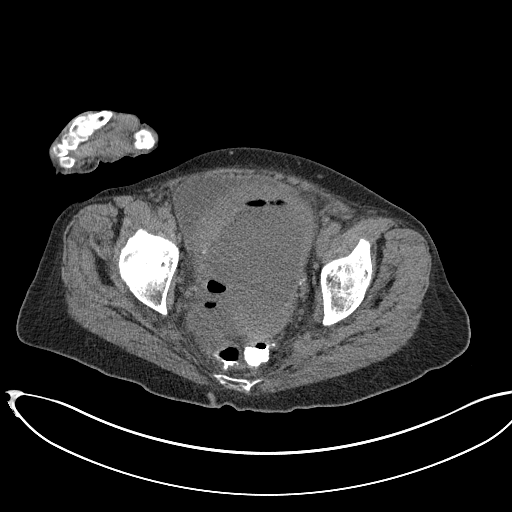
[im 32/77  soft-tissue]
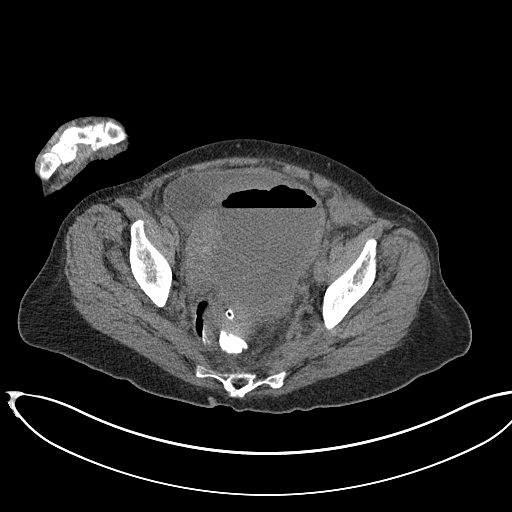
[im 39/77  soft-tissue]
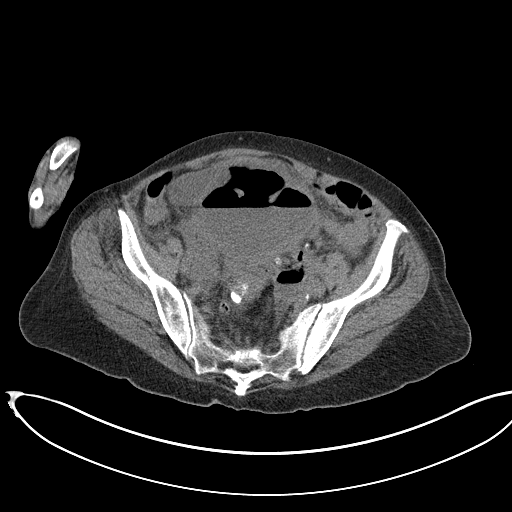
[im 45/77  soft-tissue]
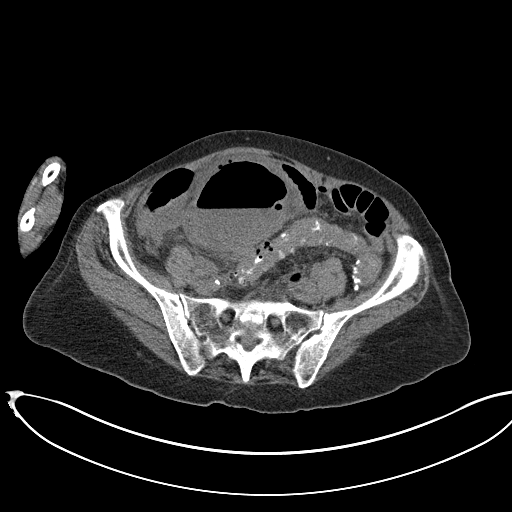
[im 51/77  soft-tissue]
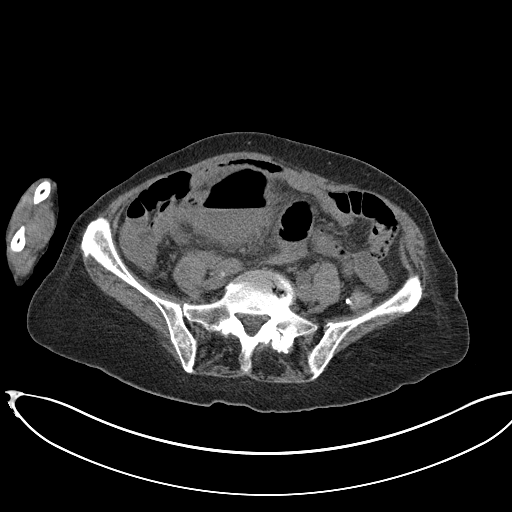
[im 51/77  bone]
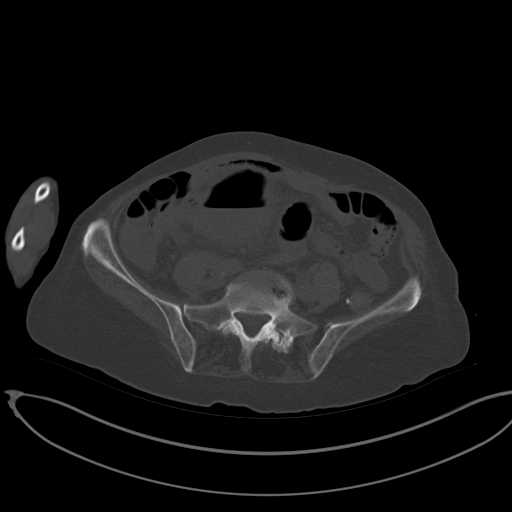
[im 54/77  soft-tissue]
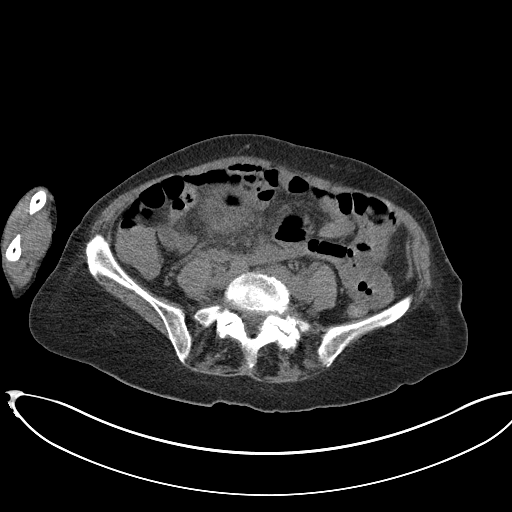
[im 61/77  soft-tissue]
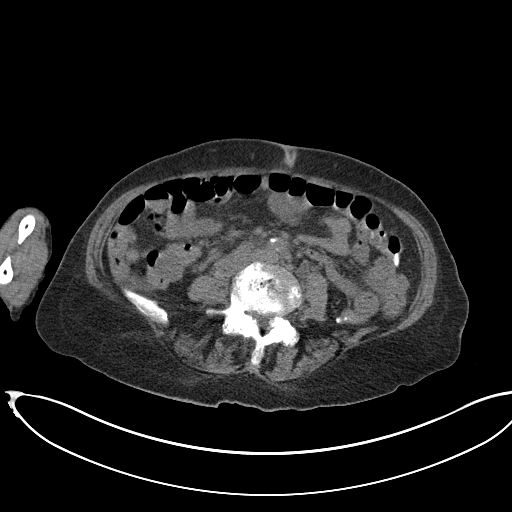
[im 64/77  lung]
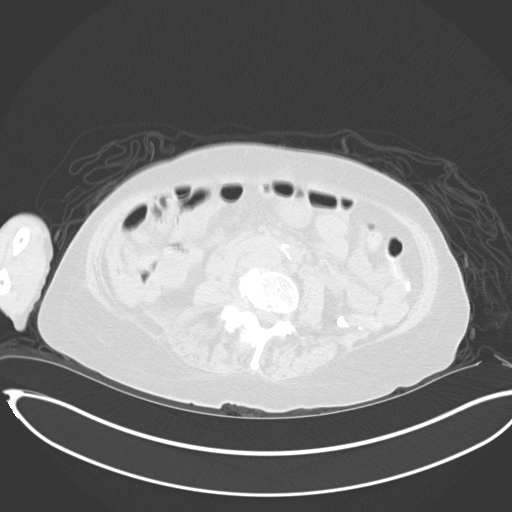
[im 67/77  soft-tissue]
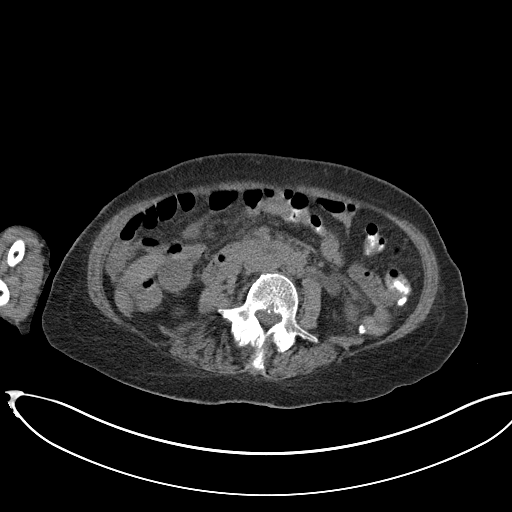
[im 67/77  lung]
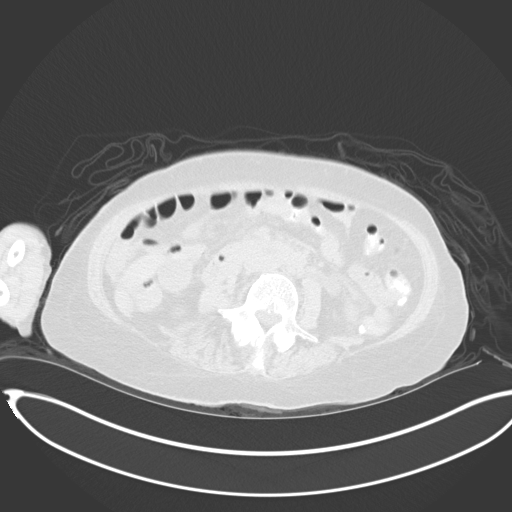
[im 70/77  lung]
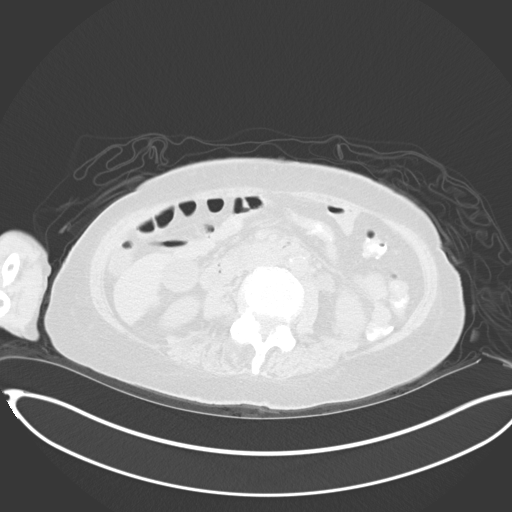
[im 73/77  soft-tissue]
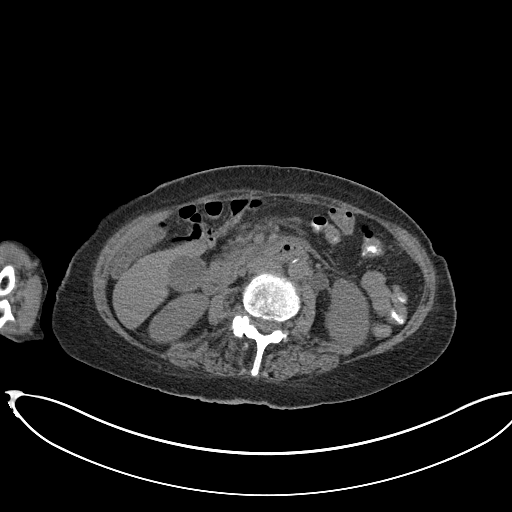
[im 73/77  lung]
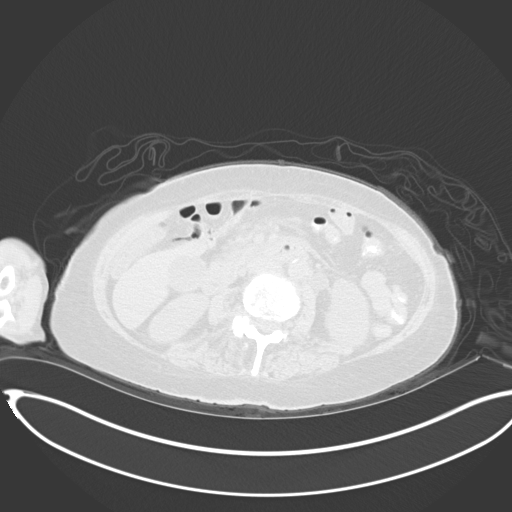

[15 of 32 positions shown; findings below may reference images not displayed]

EXAM:
CT IMAGE GUIDED DRAINAGE BY PERCUTANEOUS CATHETER

FLUOROSCOPY TIME:  None

MEDICATIONS AND MEDICAL HISTORY:
Versed 2 mg, Fentanyl 100 mcg.

Additional Medications: None.

ANESTHESIA/SEDATION:
Moderate sedation time: 12 minutes

CONTRAST:  None

PROCEDURE:
The procedure, risks, benefits, and alternatives were explained to
the patient. Questions regarding the procedure were encouraged and
answered. The patient understands and consents to the procedure.

The lower abdomen was prepped with Betadine in a sterile fashion,
and a sterile drape was applied covering the operative field. A
sterile gown and sterile gloves were used for the procedure.

Under CT guidance, an 18 gauge needle was inserted into the lower
abdominal abscess and removed over an Amplatz wire. A 12 French
dilator followed by a 12 French drain were inserted. It was looped
and string fixed and sewn to the skin. Pus was aspirated.
FINDINGS: Images document 12 French drain placement into a lower abdominal
abscess.

COMPLICATIONS:
None
IMPRESSION: Successful lower abdominal abscess drainage.

## 2017-12-30 DIAGNOSIS — Z1231 Encounter for screening mammogram for malignant neoplasm of breast: Secondary | ICD-10-CM | POA: Diagnosis not present

## 2017-12-30 DIAGNOSIS — Z803 Family history of malignant neoplasm of breast: Secondary | ICD-10-CM | POA: Diagnosis not present

## 2018-01-08 DIAGNOSIS — Z01419 Encounter for gynecological examination (general) (routine) without abnormal findings: Secondary | ICD-10-CM | POA: Diagnosis not present

## 2018-01-08 DIAGNOSIS — Z6824 Body mass index (BMI) 24.0-24.9, adult: Secondary | ICD-10-CM | POA: Diagnosis not present

## 2018-02-03 IMAGING — RF DG SINUS / FISTULA TRACT / ABSCESSOGRAM
3 series · 3 of 3 positions shown · non-contrast
Comparison: none

CLINICAL DATA: 81-year-old female with a history of diverticulitis
complicated by abscess 05/26/2015. Outside CT performed on this
date.

[Series 1: run · 1 of 1 slices shown (1 of 3)]
[im 1/1]
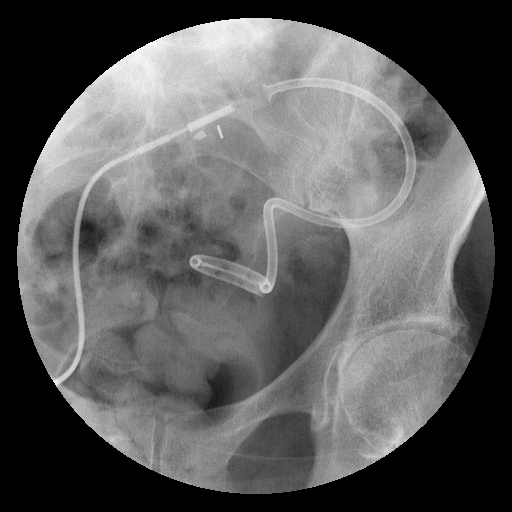

[Series 2: run · 1 of 1 slices shown (2 of 3)]
[im 1/1]
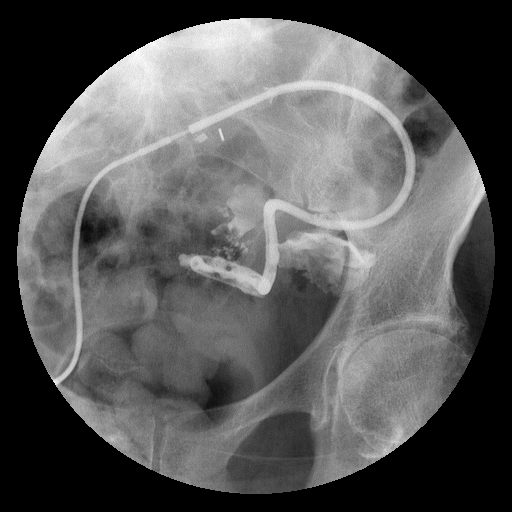

[Series 3: run · 1 of 1 slices shown (3 of 3)]
[im 1/1]
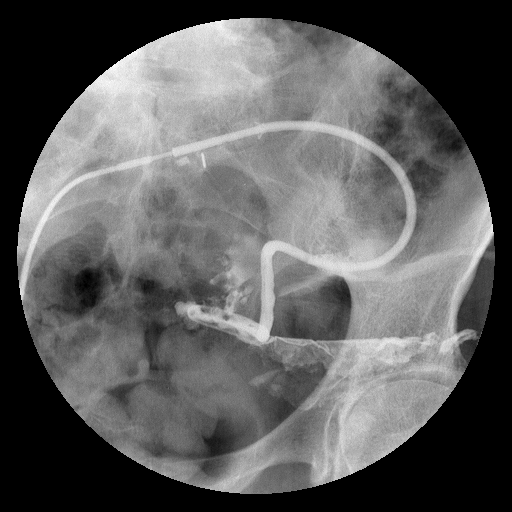

[3 of 3 positions shown; findings below may reference images not displayed]

She was referred for percutaneous drainage 05/27/2015, which was
performed in hospital.

Follow-up CT scans with the most recent 06/13/2015 demonstrated
resolution of the fluid at the tip of the catheter.

Drain injection performed 07/06/2015 demonstrated no significant
abscess cavity with small fistula.

EXAM:
DRAIN INJECTION THE IT INDWELLING DRAIN

FLUOROSCOPY TIME:  30 seconds

MEDICATIONS AND MEDICAL HISTORY:
No additional medicines

ANESTHESIA/SEDATION:
None

CONTRAST:  Contrast through the tube

COMPLICATIONS:
None

PROCEDURE:
Informed consent was obtained from the patient following explanation
of the procedure, risks, benefits and alternatives. The patient
understands, agrees and consents for the procedure. All questions
were addressed. A time out was performed.

Patient position in the supine position.

The indwelling drain was injected with contrast after scout images
were acquired.

Drain injection demonstrates a fractured catheter in the external
portion of the catheter at the Stat Lock location.

No evidence of persisting fistula within the abdomen. No significant
abscess cavity.

Bedside drain removal was performed of the damage drain.
IMPRESSION: Percutaneous drain injection demonstrates a fractured/damaged
catheter at the level of the stat lock, with no evidence of
persisting abscess or fistula.

Bedside removal was performed of the damage drain.

## 2018-03-05 DIAGNOSIS — Z23 Encounter for immunization: Secondary | ICD-10-CM | POA: Diagnosis not present

## 2018-03-13 DIAGNOSIS — H40013 Open angle with borderline findings, low risk, bilateral: Secondary | ICD-10-CM | POA: Diagnosis not present

## 2018-03-13 DIAGNOSIS — H2513 Age-related nuclear cataract, bilateral: Secondary | ICD-10-CM | POA: Diagnosis not present

## 2018-04-23 DIAGNOSIS — E7800 Pure hypercholesterolemia, unspecified: Secondary | ICD-10-CM | POA: Diagnosis not present

## 2018-04-23 DIAGNOSIS — I10 Essential (primary) hypertension: Secondary | ICD-10-CM | POA: Diagnosis not present

## 2018-04-23 DIAGNOSIS — R82998 Other abnormal findings in urine: Secondary | ICD-10-CM | POA: Diagnosis not present

## 2018-04-30 DIAGNOSIS — I10 Essential (primary) hypertension: Secondary | ICD-10-CM | POA: Diagnosis not present

## 2018-04-30 DIAGNOSIS — Z0000 Encounter for general adult medical examination without abnormal findings: Secondary | ICD-10-CM | POA: Diagnosis not present

## 2018-04-30 DIAGNOSIS — J019 Acute sinusitis, unspecified: Secondary | ICD-10-CM | POA: Diagnosis not present

## 2018-04-30 DIAGNOSIS — M5430 Sciatica, unspecified side: Secondary | ICD-10-CM | POA: Diagnosis not present

## 2018-07-01 ENCOUNTER — Ambulatory Visit: Payer: Medicare Other | Admitting: Cardiology

## 2018-07-01 NOTE — Progress Notes (Deleted)
PCP: Gaspar Garbe, MD  Clinic Note: No chief complaint on file.   HPI: Rita Harding is a 85 y.o. female who is being seen today To reestablish cardiology care at the request of Tisovec, Adelfa Koh, MD. She is a former patient of Dr. Donia Guiles before that Dr. Shirlee Latch) with a history of likely nonischemic cardiomyopathy dating back to 2007. Presumably she had an episode of Takotsubo cardiomyopathy. Cardiac cath showed 50-60% LAD lesion with some bridging, otherwise normal coronaries. By September 2013 she had an echo demonstrating EF of back to 70%.  Rita Harding was last seen on 09/20/2013 - asymptomatic with no major complaints. No heart failure symptoms.  Recent Hospitalizations: None  Studies Personally Reviewed - (if available, images/films reviewed: From Epic Chart or Care Everywhere)  No new studies (echocardiogram and Myoview results noted and past medical history/surgical history.  Interval History: Rita Harding returns today again doing quite well. She states that her husband died last year after long bout with progressive dementia. Since then she is actually starting to try to get back into an exercise regimen. She is doing more exercise and activities and she is out and about more than she was because she no longer has her husband as her responsibility. Since I last saw her, she continues to be stable without any symptoms of rest or exertional dyspnea. No chest tightness or pressure with rest or exertion. No PND or orthopnea edema. Nothing to suggest recurrence of cardiomyopathy. Her blood pressure has been borderline over last couple years, and she remains on her beta blocker and ACE inhibitor. She was just started on a statin by her PCP.  She denies any rapid irregular heartbeats palpitations, lightheadedness, dizziness, weakness or syncope/near syncope. No TIA/amaurosis fugax symptoms.  No claudication.  ROS: A comprehensive was performed. Review of  Systems  Constitutional: Negative for malaise/fatigue and weight loss.  Respiratory: Negative for shortness of breath.   Gastrointestinal: Negative for blood in stool and melena.  Genitourinary: Negative for hematuria.  Musculoskeletal: Positive for joint pain.  Neurological: Negative for dizziness.  Endo/Heme/Allergies: Positive for environmental allergies.  Psychiatric/Behavioral: Negative for memory loss. The patient is not nervous/anxious and does not have insomnia.        She seems to be handling her husbands death quite well with no significant depression.  All other systems reviewed and are negative.   I have reviewed and (if needed) personally updated the patient's problem list, medications, allergies, past medical and surgical history, social and family history.   Past Medical History:  Diagnosis Date  . Coronary artery disease 2007   non-critcal coronary single disease  . History of DVT (deep vein thrombosis)    "LLE"; BEEN OFF  COUMADIN SINCE Sept 2012  . Hyperlipidemia   . Hypertension   . MI (myocardial infarction) (HCC) 2007   Thought to be takotsubo cardiomyopathy  . Takotsubo cardiomyopathy 2007   Result with normal EF as OF 2013    Past Surgical History:  Procedure Laterality Date  . APPENDECTOMY    . CARDIAC CATHETERIZATION  01/03/2006    50-60% LAD with some myocardial  bridging; NON ISCHEMIC  CARDIOMYOPATHY  with EF  30 to 35% no evidence of infarct -- anteroapical and inferapical wall motin abnormalities compatilble with Takotsubo-type syndrome  . DOPPLER LEFT LOWER EXTREMITY ([facility] HX) Left 01/29/2011   left saphenous vein -small amt of chroinc appearing calicification on anterior wall that was nonobstructing,mildly abnormal   . DOPPLER RIGHT ADDITIONAL EXTR (  [facility] HX) Right 01/29/2011  . FIBULA FRACTURE SURGERY Left 2009   UNDER CARE DR Beverely LowSTEVE NORRIS  . FRACTURE SURGERY    . NM MYOVIEW LTD  02/21/2012   low risk  and normal EF 70% or greater  .  TONSILLECTOMY    . TRANSTHORACIC ECHOCARDIOGRAM  07/31/2010   no MVP ,NORMAL SYSTOLIC FUNCTION (EF >55%). Mild Ao Sclerosis.      No outpatient medications have been marked as taking for the 07/01/18 encounter (Appointment) with Marykay LexHarding, David W, MD.    Allergies  Allergen Reactions  . Codeine Nausea And Vomiting  . Tetanus Toxoids Other (See Comments)    Swollen and red ( patient informed that she had to take it in three doses)    Social History   Socioeconomic History  . Marital status: Married    Spouse name: Not on file  . Number of children: Not on file  . Years of education: Not on file  . Highest education level: Not on file  Occupational History  . Not on file  Social Needs  . Financial resource strain: Not on file  . Food insecurity:    Worry: Not on file    Inability: Not on file  . Transportation needs:    Medical: Not on file    Non-medical: Not on file  Tobacco Use  . Smoking status: Former Smoker    Packs/day: 1.00    Years: 15.00    Pack years: 15.00    Types: Cigarettes  . Smokeless tobacco: Never Used  . Tobacco comment: "quit smoking in the 1960s"  Substance and Sexual Activity  . Alcohol use: Yes    Comment: 05/27/2015 "nothing in 1 month; I drank a couple glasses of wine/day before that"  . Drug use: No  . Sexual activity: Never  Lifestyle  . Physical activity:    Days per week: Not on file    Minutes per session: Not on file  . Stress: Not on file  Relationships  . Social connections:    Talks on phone: Not on file    Gets together: Not on file    Attends religious service: Not on file    Active member of club or organization: Not on file    Attends meetings of clubs or organizations: Not on file    Relationship status: Not on file  Other Topics Concern  . Not on file  Social History Narrative   Married mother of 3 with 5 grandchildren. Quit smoking over 30 years ago.   Goes with her husband, who is a former major [employer] baseball  player, and also in a patient of mine with progression of dementia. 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   She continues to be active, but is no longer walking as frequent as she used to. She still does work around the yard and house.    family history includes Cancer in her maternal grandmother and mother; Heart attack in her father; Heart disease in her paternal grandfather and paternal grandmother.  Wt Readings from Last 3 Encounters:  11/28/16 130 lb (59 kg)  05/26/15 133 lb 14.4 oz (60.7 kg)  09/20/13 137 lb 3.2 oz (62.2 kg)    PHYSICAL EXAM There were no vitals taken for this visit. General appearance: alert, cooperative, appears stated age, no distress. Well-nourished, well-groomed. HEENT: [place]/AT, EOMI, MMM, anicteric sclera Neck: no adenopathy, no carotid bruit and no JVD Lungs: clear to auscultation bilaterally, normal percussion bilaterally  and non-labored Heart: RRR, normal S1 and split S2. 2/6 SEM at RUSB. No rubs or gallops. Nondisplaced PMI. Abdomen: soft, non-tender; bowel sounds normal; no masses,  no organomegaly; no HJR Extremities: extremities normal, atraumatic, no Clubbing or cyanosis, - trace L> R ankle swelling (she indicated prior injury & surgery of that ankle). Pulses: 2+ and symmetric;  Skin: mobility and turgor normal, no eczematous changes, no edema and no evidence of bleeding or bruising or  Neurologic: Mental status: Alert & oriented x 3, thought content appropriate; non-focal exam.  Pleasant mood & affect.    Adult ECG Report  Rate: 75 ;  Rhythm: normal sinus rhythm, premature ventricular contractions (PVC) and L axis deviation & borderline LVH;   Narrative Interpretation: Stable EKG   Other studies Reviewed: Additional studies/ records that were reviewed today include:  Recent Labs:  N/A   ASSESSMENT / PLAN: Problem List Items Addressed This Visit    None      Current medicines are reviewed at length with the  patient today. (+/- concerns) N/A The following changes have been made: N/A  There are no Patient Instructions on file for this visit.  Studies Ordered:   No orders of the defined types were placed in this encounter.     Bryan Lemma, M.D., M.S. Interventional Cardiologist   Pager # [phone] Phone # [phone] [address] [place], [place] [address]

## 2018-07-14 DIAGNOSIS — N814 Uterovaginal prolapse, unspecified: Secondary | ICD-10-CM | POA: Diagnosis not present

## 2018-07-14 DIAGNOSIS — N8189 Other female genital prolapse: Secondary | ICD-10-CM | POA: Diagnosis not present

## 2018-07-14 DIAGNOSIS — N952 Postmenopausal atrophic vaginitis: Secondary | ICD-10-CM | POA: Diagnosis not present

## 2018-08-21 ENCOUNTER — Ambulatory Visit (HOSPITAL_BASED_OUTPATIENT_CLINIC_OR_DEPARTMENT_OTHER): Payer: Medicare Other | Admitting: Anesthesiology

## 2018-08-21 ENCOUNTER — Ambulatory Visit (HOSPITAL_BASED_OUTPATIENT_CLINIC_OR_DEPARTMENT_OTHER)
Admission: RE | Admit: 2018-08-21 | Discharge: 2018-08-21 | Disposition: A | Discharge status: Home / Self Care | Payer: Medicare Other | Source: Ambulatory Visit | Attending: Orthopedic Surgery | Admitting: Orthopedic Surgery

## 2018-08-21 ENCOUNTER — Emergency Department (HOSPITAL_COMMUNITY): Payer: Medicare Other

## 2018-08-21 ENCOUNTER — Encounter (HOSPITAL_COMMUNITY): Payer: Self-pay

## 2018-08-21 ENCOUNTER — Other Ambulatory Visit: Payer: Self-pay

## 2018-08-21 ENCOUNTER — Emergency Department (HOSPITAL_COMMUNITY)
Admission: EM | Admit: 2018-08-21 | Discharge: 2018-08-21 | Disposition: A | Discharge status: Home / Self Care | Payer: Medicare Other | Source: Home / Self Care | Attending: Emergency Medicine | Admitting: Emergency Medicine

## 2018-08-21 ENCOUNTER — Encounter (HOSPITAL_BASED_OUTPATIENT_CLINIC_OR_DEPARTMENT_OTHER)
Admission: RE | Disposition: A | Discharge status: Home / Self Care | Payer: Self-pay | Source: Ambulatory Visit | Attending: Orthopedic Surgery

## 2018-08-21 ENCOUNTER — Encounter (HOSPITAL_BASED_OUTPATIENT_CLINIC_OR_DEPARTMENT_OTHER): Payer: Self-pay | Admitting: *Deleted

## 2018-08-21 ENCOUNTER — Other Ambulatory Visit: Payer: Self-pay | Admitting: Orthopedic Surgery

## 2018-08-21 DIAGNOSIS — I10 Essential (primary) hypertension: Secondary | ICD-10-CM | POA: Insufficient documentation

## 2018-08-21 DIAGNOSIS — Z79899 Other long term (current) drug therapy: Secondary | ICD-10-CM

## 2018-08-21 DIAGNOSIS — W540XXA Bitten by dog, initial encounter: Secondary | ICD-10-CM | POA: Insufficient documentation

## 2018-08-21 DIAGNOSIS — Y999 Unspecified external cause status: Secondary | ICD-10-CM

## 2018-08-21 DIAGNOSIS — S68625A Partial traumatic transphalangeal amputation of left ring finger, initial encounter: Secondary | ICD-10-CM | POA: Insufficient documentation

## 2018-08-21 DIAGNOSIS — S61353A Open bite of left middle finger with damage to nail, initial encounter: Secondary | ICD-10-CM | POA: Diagnosis not present

## 2018-08-21 DIAGNOSIS — Y929 Unspecified place or not applicable: Secondary | ICD-10-CM | POA: Insufficient documentation

## 2018-08-21 DIAGNOSIS — Z23 Encounter for immunization: Secondary | ICD-10-CM

## 2018-08-21 DIAGNOSIS — Z86718 Personal history of other venous thrombosis and embolism: Secondary | ICD-10-CM | POA: Diagnosis not present

## 2018-08-21 DIAGNOSIS — E785 Hyperlipidemia, unspecified: Secondary | ICD-10-CM | POA: Insufficient documentation

## 2018-08-21 DIAGNOSIS — S68125A Partial traumatic metacarpophalangeal amputation of left ring finger, initial encounter: Secondary | ICD-10-CM | POA: Diagnosis not present

## 2018-08-21 DIAGNOSIS — S61255A Open bite of left ring finger without damage to nail, initial encounter: Secondary | ICD-10-CM | POA: Diagnosis not present

## 2018-08-21 DIAGNOSIS — Y9389 Activity, other specified: Secondary | ICD-10-CM

## 2018-08-21 DIAGNOSIS — Z7982 Long term (current) use of aspirin: Secondary | ICD-10-CM | POA: Insufficient documentation

## 2018-08-21 DIAGNOSIS — I2510 Atherosclerotic heart disease of native coronary artery without angina pectoris: Secondary | ICD-10-CM

## 2018-08-21 DIAGNOSIS — I252 Old myocardial infarction: Secondary | ICD-10-CM

## 2018-08-21 DIAGNOSIS — S68119A Complete traumatic metacarpophalangeal amputation of unspecified finger, initial encounter: Secondary | ICD-10-CM | POA: Diagnosis not present

## 2018-08-21 DIAGNOSIS — Z89022 Acquired absence of left finger(s): Secondary | ICD-10-CM | POA: Diagnosis not present

## 2018-08-21 DIAGNOSIS — S61452A Open bite of left hand, initial encounter: Secondary | ICD-10-CM | POA: Diagnosis not present

## 2018-08-21 DIAGNOSIS — Z87891 Personal history of nicotine dependence: Secondary | ICD-10-CM | POA: Insufficient documentation

## 2018-08-21 DIAGNOSIS — S68115A Complete traumatic metacarpophalangeal amputation of left ring finger, initial encounter: Secondary | ICD-10-CM | POA: Insufficient documentation

## 2018-08-21 DIAGNOSIS — S61253A Open bite of left middle finger without damage to nail, initial encounter: Secondary | ICD-10-CM | POA: Diagnosis not present

## 2018-08-21 HISTORY — PX: AMPUTATION: SHX166

## 2018-08-21 SURGERY — AMPUTATION DIGIT
Anesthesia: General | Site: Finger | Laterality: Left

## 2018-08-21 MED ORDER — BACITRACIN-NEOMYCIN-POLYMYXIN 400-5-5000 EX OINT
TOPICAL_OINTMENT | Freq: Once | CUTANEOUS | Status: AC
  Administered 2018-08-21: 03:00:00 via TOPICAL

## 2018-08-21 MED ORDER — LIDOCAINE 2% (20 MG/ML) 5 ML SYRINGE
INTRAMUSCULAR | Status: AC
  Filled 2018-08-21: qty 5

## 2018-08-21 MED ORDER — EPHEDRINE SULFATE 50 MG/ML IJ SOLN
INTRAMUSCULAR | Status: DC | PRN
  Administered 2018-08-21: 15 mg via INTRAVENOUS

## 2018-08-21 MED ORDER — HYDROCODONE-ACETAMINOPHEN 5-325 MG PO TABS: 1.0000 | ORAL_TABLET | ORAL | 0 refills | Status: DC | PRN

## 2018-08-21 MED ORDER — ONDANSETRON HCL 4 MG/2ML IJ SOLN
INTRAMUSCULAR | Status: DC | PRN
  Administered 2018-08-21: 4 mg via INTRAVENOUS

## 2018-08-21 MED ORDER — EPHEDRINE 5 MG/ML INJ
INTRAVENOUS | Status: AC
  Filled 2018-08-21: qty 10

## 2018-08-21 MED ORDER — BUPIVACAINE HCL (PF) 0.5 % IJ SOLN
10.0000 mL | Freq: Once | INTRAMUSCULAR | Status: AC
  Administered 2018-08-21: 10 mL
  Filled 2018-08-21: qty 30

## 2018-08-21 MED ORDER — PHENYLEPHRINE 40 MCG/ML (10ML) SYRINGE FOR IV PUSH (FOR BLOOD PRESSURE SUPPORT)
PREFILLED_SYRINGE | INTRAVENOUS | Status: AC
  Filled 2018-08-21: qty 10

## 2018-08-21 MED ORDER — TETANUS-DIPHTH-ACELL PERTUSSIS 5-2.5-18.5 LF-MCG/0.5 IM SUSP
0.5000 mL | Freq: Once | INTRAMUSCULAR | Status: AC
  Administered 2018-08-21: 0.5 mL via INTRAMUSCULAR
  Filled 2018-08-21: qty 0.5

## 2018-08-21 MED ORDER — ONDANSETRON 4 MG PO TBDP: 4.0000 mg | ORAL_TABLET | Freq: Four times a day (QID) | ORAL | 0 refills | Status: DC | PRN

## 2018-08-21 MED ORDER — CEFAZOLIN SODIUM-DEXTROSE 2-4 GM/100ML-% IV SOLN
INTRAVENOUS | Status: AC
  Filled 2018-08-21: qty 100

## 2018-08-21 MED ORDER — PROMETHAZINE HCL 25 MG/ML IJ SOLN: 6.2500 mg | INTRAMUSCULAR | Status: DC | PRN

## 2018-08-21 MED ORDER — LACTATED RINGERS IV SOLN
INTRAVENOUS | Status: DC
  Administered 2018-08-21 (×2): via INTRAVENOUS

## 2018-08-21 MED ORDER — OXYCODONE HCL 5 MG/5ML PO SOLN: 5.0000 mg | Freq: Once | ORAL | Status: DC | PRN

## 2018-08-21 MED ORDER — DEXAMETHASONE SODIUM PHOSPHATE 10 MG/ML IJ SOLN
INTRAMUSCULAR | Status: AC
  Filled 2018-08-21: qty 1

## 2018-08-21 MED ORDER — AMOXICILLIN-POT CLAVULANATE 875-125 MG PO TABS: 1.0000 | ORAL_TABLET | Freq: Two times a day (BID) | ORAL | 0 refills | Status: DC

## 2018-08-21 MED ORDER — DEXAMETHASONE SODIUM PHOSPHATE 10 MG/ML IJ SOLN
INTRAMUSCULAR | Status: DC | PRN
  Administered 2018-08-21: 4 mg via INTRAVENOUS

## 2018-08-21 MED ORDER — PROPOFOL 500 MG/50ML IV EMUL
INTRAVENOUS | Status: AC
  Filled 2018-08-21: qty 50

## 2018-08-21 MED ORDER — HYDROMORPHONE HCL 1 MG/ML IJ SOLN: 0.2500 mg | INTRAMUSCULAR | Status: DC | PRN

## 2018-08-21 MED ORDER — ONDANSETRON HCL 4 MG/2ML IJ SOLN
4.0000 mg | Freq: Once | INTRAMUSCULAR | Status: AC
  Administered 2018-08-21: 4 mg via INTRAVENOUS
  Filled 2018-08-21: qty 2

## 2018-08-21 MED ORDER — OXYCODONE HCL 5 MG PO TABS: 5.0000 mg | ORAL_TABLET | Freq: Once | ORAL | Status: DC | PRN

## 2018-08-21 MED ORDER — HYDROCODONE-ACETAMINOPHEN 5-325 MG PO TABS: ORAL_TABLET | ORAL | 0 refills | Status: DC

## 2018-08-21 MED ORDER — BUPIVACAINE HCL (PF) 0.25 % IJ SOLN
INTRAMUSCULAR | Status: AC
  Filled 2018-08-21: qty 30

## 2018-08-21 MED ORDER — CEFAZOLIN SODIUM-DEXTROSE 2-3 GM-%(50ML) IV SOLR
INTRAVENOUS | Status: DC | PRN
  Administered 2018-08-21: 2 g via INTRAVENOUS

## 2018-08-21 MED ORDER — FENTANYL CITRATE (PF) 100 MCG/2ML IJ SOLN
INTRAMUSCULAR | Status: AC
  Filled 2018-08-21: qty 2

## 2018-08-21 MED ORDER — CHLORHEXIDINE GLUCONATE 4 % EX LIQD: 60.0000 mL | Freq: Once | CUTANEOUS | Status: DC

## 2018-08-21 MED ORDER — LIDOCAINE HCL (CARDIAC) PF 100 MG/5ML IV SOSY
PREFILLED_SYRINGE | INTRAVENOUS | Status: DC | PRN
  Administered 2018-08-21: 50 mg via INTRAVENOUS

## 2018-08-21 MED ORDER — ONDANSETRON HCL 4 MG/2ML IJ SOLN
INTRAMUSCULAR | Status: AC
  Filled 2018-08-21: qty 2

## 2018-08-21 MED ORDER — DIPHENHYDRAMINE HCL 50 MG/ML IJ SOLN
25.0000 mg | Freq: Once | INTRAMUSCULAR | Status: AC
  Administered 2018-08-21: 25 mg via INTRAVENOUS
  Filled 2018-08-21: qty 1

## 2018-08-21 MED ORDER — PROPOFOL 10 MG/ML IV BOLUS
INTRAVENOUS | Status: DC | PRN
  Administered 2018-08-21: 150 mg via INTRAVENOUS

## 2018-08-21 MED ORDER — FENTANYL CITRATE (PF) 100 MCG/2ML IJ SOLN
INTRAMUSCULAR | Status: DC | PRN
  Administered 2018-08-21 (×2): 50 ug via INTRAVENOUS

## 2018-08-21 MED ORDER — SODIUM CHLORIDE 0.9 % IV SOLN
3.0000 g | Freq: Once | INTRAVENOUS | Status: AC
  Administered 2018-08-21: 3 g via INTRAVENOUS
  Filled 2018-08-21: qty 3

## 2018-08-21 MED ORDER — FENTANYL CITRATE (PF) 100 MCG/2ML IJ SOLN
50.0000 ug | Freq: Once | INTRAMUSCULAR | Status: AC
  Administered 2018-08-21: 50 ug via INTRAVENOUS
  Filled 2018-08-21: qty 2

## 2018-08-21 MED ORDER — SUCCINYLCHOLINE CHLORIDE 200 MG/10ML IV SOSY
PREFILLED_SYRINGE | INTRAVENOUS | Status: AC
  Filled 2018-08-21: qty 10

## 2018-08-21 MED ORDER — BUPIVACAINE HCL (PF) 0.25 % IJ SOLN
INTRAMUSCULAR | Status: DC | PRN
  Administered 2018-08-21: 10 mL

## 2018-08-21 SURGICAL SUPPLY — 56 items
APL PRP STRL LF DISP 70% ISPRP (MISCELLANEOUS)
BANDAGE ACE 3X5.8 VEL STRL LF (GAUZE/BANDAGES/DRESSINGS) IMPLANT
BLADE MINI RND TIP GREEN BEAV (BLADE) IMPLANT
BLADE SURG 15 STRL LF DISP TIS (BLADE) ×2 IMPLANT
BLADE SURG 15 STRL SS (BLADE) ×4
BNDG CMPR 9X4 STRL LF SNTH (GAUZE/BANDAGES/DRESSINGS) ×1
BNDG COHESIVE 1X5 TAN STRL LF (GAUZE/BANDAGES/DRESSINGS) IMPLANT
BNDG ELASTIC 2X5.8 VLCR STR LF (GAUZE/BANDAGES/DRESSINGS) IMPLANT
BNDG ESMARK 4X9 LF (GAUZE/BANDAGES/DRESSINGS) ×1 IMPLANT
BNDG GAUZE 1X2.1 STRL (MISCELLANEOUS) IMPLANT
BRUSH SCRUB EZ PLAIN DRY (MISCELLANEOUS) ×1 IMPLANT
CHLORAPREP W/TINT 26 (MISCELLANEOUS) IMPLANT
CORD BIPOLAR FORCEPS 12FT (ELECTRODE) ×2 IMPLANT
COVER BACK TABLE 60X90IN (DRAPES) ×2 IMPLANT
COVER MAYO STAND STRL (DRAPES) ×2 IMPLANT
COVER WAND RF STERILE (DRAPES) IMPLANT
DECANTER SPIKE VIAL GLASS SM (MISCELLANEOUS) IMPLANT
DRAIN PENROSE 1/4X12 LTX STRL (WOUND CARE) IMPLANT
DRAPE EXTREMITY T 121X128X90 (DISPOSABLE) ×2 IMPLANT
DRAPE OEC MINIVIEW 54X84 (DRAPES) IMPLANT
DRAPE SURG 17X23 STRL (DRAPES) ×2 IMPLANT
GAUZE PACKING IODOFORM 1/4X5 (PACKING) ×1 IMPLANT
GAUZE SPONGE 4X4 12PLY STRL (GAUZE/BANDAGES/DRESSINGS) ×2 IMPLANT
GAUZE SPONGE 4X4 12PLY STRL LF (GAUZE/BANDAGES/DRESSINGS) IMPLANT
GAUZE XEROFORM 1X8 LF (GAUZE/BANDAGES/DRESSINGS) ×2 IMPLANT
GLOVE BIO SURGEON STRL SZ7.5 (GLOVE) ×2 IMPLANT
GLOVE BIOGEL PI IND STRL 7.0 (GLOVE) IMPLANT
GLOVE BIOGEL PI IND STRL 8 (GLOVE) ×1 IMPLANT
GLOVE BIOGEL PI INDICATOR 7.0 (GLOVE) ×1
GLOVE BIOGEL PI INDICATOR 8 (GLOVE) ×1
GLOVE ECLIPSE 6.5 STRL STRAW (GLOVE) ×1 IMPLANT
GLOVE EXAM NITRILE MD LF STRL (GLOVE) ×1 IMPLANT
GOWN STRL REUS W/ TWL LRG LVL3 (GOWN DISPOSABLE) ×1 IMPLANT
GOWN STRL REUS W/TWL LRG LVL3 (GOWN DISPOSABLE) ×2
GOWN STRL REUS W/TWL XL LVL3 (GOWN DISPOSABLE) ×2 IMPLANT
LOOP VESSEL MAXI BLUE (MISCELLANEOUS) ×1 IMPLANT
NDL HYPO 25X1 1.5 SAFETY (NEEDLE) IMPLANT
NEEDLE HYPO 25X1 1.5 SAFETY (NEEDLE) ×2 IMPLANT
NS IRRIG 1000ML POUR BTL (IV SOLUTION) ×2 IMPLANT
PACK BASIN DAY SURGERY FS (CUSTOM PROCEDURE TRAY) ×2 IMPLANT
PADDING CAST ABS 4INX4YD NS (CAST SUPPLIES)
PADDING CAST ABS COTTON 4X4 ST (CAST SUPPLIES) ×1 IMPLANT
SPLINT FINGER 3.25 911903 (SOFTGOODS) ×1 IMPLANT
STOCKINETTE 4X48 STRL (DRAPES) ×2 IMPLANT
SUT CHROMIC 4 0 P 3 18 (SUTURE) IMPLANT
SUT ETHILON 3 0 PS 1 (SUTURE) IMPLANT
SUT ETHILON 4 0 PS 2 18 (SUTURE) IMPLANT
SUT MERSILENE 4 0 P 3 (SUTURE) IMPLANT
SUT MON AB 5-0 P3 18 (SUTURE) ×1 IMPLANT
SUT VIC AB 4-0 P-3 18XBRD (SUTURE) IMPLANT
SUT VIC AB 4-0 P3 18 (SUTURE)
SYR BULB 3OZ (MISCELLANEOUS) ×2 IMPLANT
SYR CONTROL 10ML LL (SYRINGE) ×1 IMPLANT
TOWEL GREEN STERILE FF (TOWEL DISPOSABLE) ×2 IMPLANT
TRAY DSU PREP LF (CUSTOM PROCEDURE TRAY) ×2 IMPLANT
UNDERPAD 30X30 (UNDERPADS AND DIAPERS) ×2 IMPLANT

## 2018-08-21 NOTE — H&P (Signed)
Rita Harding is an 85 y.o. female.   Chief Complaint: left finger amputation HPI: 85 year old right-hand-dominant female who is present with her daughter.  She states she was bitten by her dog yesterday evening while breaking up a fight.  She sustained a laceration to the left long finger and amputation of the distal aspect of the left ring finger.  She was seen at the emergency department where the wounds were cleaned and dressings placed.  She reports no previous injury to the fingers and no other injuries at this time.  She describes a throbbing aching pain.  Her symptoms are alleviated with the digital block she was given by the emergency department and aggravated by nothing.  There is associated bleeding.  Allergies:  Allergies  Allergen Reactions  . Codeine Nausea And Vomiting  . Tetanus Toxoids Other (See Comments)    Swollen and red ( patient informed that she had to take it in three doses)    Past Medical History:  Diagnosis Date  . Coronary artery disease 2007   non-critcal coronary single disease  . History of DVT (deep vein thrombosis)    "LLE"; BEEN OFF  COUMADIN SINCE Sept 2012  . Hyperlipidemia   . Hypertension   . MI (myocardial infarction) (HCC) 2007   Thought to be takotsubo cardiomyopathy  . Takotsubo cardiomyopathy 2007   Result with normal EF as OF 2013    Past Surgical History:  Procedure Laterality Date  . APPENDECTOMY    . CARDIAC CATHETERIZATION  01/03/2006    50-60% LAD with some myocardial  bridging; NON ISCHEMIC  CARDIOMYOPATHY  with EF  30 to 35% no evidence of infarct -- anteroapical and inferapical wall motin abnormalities compatilble with Takotsubo-type syndrome  . DOPPLER LEFT LOWER EXTREMITY ([facility] HX) Left 01/29/2011   left saphenous vein -small amt of chroinc appearing calicification on anterior wall that was nonobstructing,mildly abnormal   . DOPPLER RIGHT ADDITIONAL EXTR ([facility] HX) Right 01/29/2011  . FIBULA FRACTURE SURGERY Left 2009   UNDER CARE DR Beverely Low  . FRACTURE SURGERY    . NM MYOVIEW LTD  02/21/2012   low risk  and normal EF 70% or greater  . TONSILLECTOMY    . TRANSTHORACIC ECHOCARDIOGRAM  07/31/2010   no MVP ,NORMAL SYSTOLIC FUNCTION (EF >55%). Mild Ao Sclerosis.      Family History: Family History  Problem Relation Age of Onset  . Cancer Mother   . Heart attack Father   . Cancer Maternal Grandmother   . Heart disease Paternal Grandmother   . Heart disease Paternal Grandfather     Social History:   reports that she has quit smoking. Her smoking use included cigarettes. She has a 15.00 pack-year smoking history. She has never used smokeless tobacco. She reports current alcohol use. She reports that she does not use drugs.  Medications: Medications Prior to Admission  Medication Sig Dispense Refill  . aspirin EC 81 MG tablet Take 81 mg by mouth daily.    . benazepril (LOTENSIN) 10 MG tablet Take 10 mg by mouth daily.    . carvedilol (COREG) 12.5 MG tablet Take 12.5 mg by mouth 2 (two) times daily with a meal.    . Multiple Vitamin (MULTIVITAMIN) tablet Take 1 tablet by mouth daily.    Marland Kitchen amoxicillin-clavulanate (AUGMENTIN) 875-125 MG tablet Take 1 tablet by mouth every 12 (twelve) hours. 14 tablet 0  . HYDROcodone-acetaminophen (NORCO/VICODIN) 5-325 MG tablet Take 1 tablet by mouth every 4 (four) hours as needed.  15 tablet 0  . ondansetron (ZOFRAN ODT) 4 MG disintegrating tablet Take 1 tablet (4 mg total) by mouth every 6 (six) hours as needed for nausea or vomiting. 20 tablet 0    No results found for this or any previous visit (from the past 48 hour(s)).  Dg Finger Ring Left  Result Date: 08/21/2018 CLINICAL DATA:  Dog bite this evening. EXAM: LEFT RING FINGER 2+V COMPARISON:  None. FINDINGS: Amputation of the distal phalanx of the left fourth finger at the midshaft level. Margin of amputation is irregular. Soft tissue avulsion. No involvement of the articular surface. There are degenerative  changes in the interphalangeal joints. Diffuse bone demineralization. IMPRESSION: Amputation of the distal phalanx of the left fourth finger. Electronically Signed   By: Burman Nieves M.D.   On: 08/21/2018 00:51     A comprehensive review of systems was negative.  There were no vitals taken for this visit.  General appearance: alert, cooperative and appears stated age Head: Normocephalic, without obvious abnormality, atraumatic Neck: supple, symmetrical, trachea midline Cardio: regular rate and rhythm Resp: clear to auscultation bilaterally Extremities: Intact sensation and capillary refill all digits.  +epl/fpl/io.  Amputation distal aspect left ring finger. Pulses: 2+ and symmetric Skin: Skin color, texture, turgor normal. No rashes or lesions Neurologic: Grossly normal Incision/Wound: as above  Assessment/Plan Left ring amputation and dog bite wounds long finger.  Recommend revision amputation ring finger and I&D long finger.  Non operative and operative treatment options have been discussed with the patient and patient wishes to proceed with operative treatment. Risks, benefits, and alternatives of surgery have been discussed and the patient agrees with the plan of care.   Betha Loa 08/21/2018, 12:01 PM

## 2018-08-21 NOTE — Anesthesia Preprocedure Evaluation (Signed)
Anesthesia Evaluation  Patient identified by MRN, date of birth, ID band Patient awake    Reviewed: Allergy & Precautions, NPO status , Patient's Chart, lab work & pertinent test results, reviewed documented beta blocker date and time   Airway Mallampati: II  TM Distance: >3 FB Neck ROM: Full    Dental no notable dental hx.    Pulmonary neg pulmonary ROS, former smoker,    Pulmonary exam normal breath sounds clear to auscultation       Cardiovascular hypertension, Pt. on medications and Pt. on home beta blockers + CAD and + Past MI  Normal cardiovascular exam Rhythm:Regular Rate:Normal     Neuro/Psych negative neurological ROS  negative psych ROS   GI/Hepatic negative GI ROS, Neg liver ROS,   Endo/Other  negative endocrine ROS  Renal/GU negative Renal ROS  negative genitourinary   Musculoskeletal negative musculoskeletal ROS (+)   Abdominal   Peds negative pediatric ROS (+)  Hematology negative hematology ROS (+)   Anesthesia Other Findings   Reproductive/Obstetrics negative OB ROS                             Anesthesia Physical Anesthesia Plan  ASA: III  Anesthesia Plan: General   Post-op Pain Management:    Induction: Intravenous  PONV Risk Score and Plan: 3 and Ondansetron, Dexamethasone and Midazolam  Airway Management Planned: LMA  Additional Equipment:   Intra-op Plan:   Post-operative Plan: Extubation in OR  Informed Consent: I have reviewed the patients History and Physical, chart, labs and discussed the procedure including the risks, benefits and alternatives for the proposed anesthesia with the patient or authorized representative who has indicated his/her understanding and acceptance.     Dental advisory given  Plan Discussed with: CRNA  Anesthesia Plan Comments:         Anesthesia Quick Evaluation

## 2018-08-21 NOTE — Transfer of Care (Signed)
Immediate Anesthesia Transfer of Care Note  Patient: Rita Harding  Procedure(s) Performed: REVISION AMPUTATION LEFT RING FINGER AND INCISION AND DRAINAGE LEFT LONG FINGER (Left Finger)  Patient Location: PACU  Anesthesia Type:General  Level of Consciousness: sedated  Airway & Oxygen Therapy: Patient Spontanous Breathing and Patient connected to face mask oxygen  Post-op Assessment: Report given to RN and Post -op Vital signs reviewed and stable  Post vital signs: Reviewed and stable  Last Vitals:  Vitals Value Taken Time  BP 120/70 08/21/2018  3:10 PM  Temp    Pulse 77 08/21/2018  3:15 PM  Resp 20 08/21/2018  3:15 PM  SpO2 100 % 08/21/2018  3:15 PM  Vitals shown include unvalidated device data.  Last Pain:  Vitals:   08/21/18 1208  TempSrc: Oral  PainSc: 5       Patients Stated Pain Goal: 2 (08/21/18 1208)  Complications: No apparent anesthesia complications

## 2018-08-21 NOTE — ED Triage Notes (Addendum)
Pt reports that her dog bit her L ring finger. Her distal phalange is in a bag on ice. Bleeding controlled with pressure at this time. Pt in no distress. She reports that she is on a blood thinner, but is unsure which one. No thinner documented in chart. Dog is known and up to date on shots.

## 2018-08-21 NOTE — ED Provider Notes (Signed)
TIME SEEN: 1:11 AM  CHIEF COMPLAINT: Left fourth digit injury  HPI: Patient is an 85 year old female with history of CAD, DVT no longer on anticoagulation, hypertension, hyperlipidemia who is right-hand dominant who presents to the emergency department with a left fourth digit injury that occurred just prior to arrival.  States that her dogs got into a fight tonight and she tried to break it up.  One of the dogs bit the end of her fourth left digit off.  No other injury.  Unsure of her last tetanus vaccination but states she gets a local reaction around the area to tetanus vaccinations.  No anaphylactic reaction.  States her dogs are all up-to-date on rabies vaccinations.  ROS: See HPI Constitutional: no fever  Eyes: no drainage  ENT: no runny nose   Cardiovascular:  no chest pain  Resp: no SOB  GI: no vomiting GU: no dysuria Integumentary: no rash  Allergy: no hives  Musculoskeletal: no leg swelling  Neurological: no slurred speech ROS otherwise negative  PAST MEDICAL HISTORY/PAST SURGICAL HISTORY:  Past Medical History:  Diagnosis Date  . Coronary artery disease 2007   non-critcal coronary single disease  . History of DVT (deep vein thrombosis)    "LLE"; BEEN OFF  COUMADIN SINCE Sept 2012  . Hyperlipidemia   . Hypertension   . MI (myocardial infarction) (HCC) 2007   Thought to be takotsubo cardiomyopathy  . Takotsubo cardiomyopathy 2007   Result with normal EF as OF 2013    MEDICATIONS:  Prior to Admission medications   Medication Sig Start Date End Date Taking? Authorizing Provider  aspirin EC 81 MG tablet Take 81 mg by mouth daily.    [provider]  benazepril (LOTENSIN) 10 MG tablet Take 10 mg by mouth daily.    [provider]  carvedilol (COREG) 12.5 MG tablet Take 12.5 mg by mouth 2 (two) times daily with a meal.    [provider]    ALLERGIES:  Allergies  Allergen Reactions  . Codeine Nausea And Vomiting  . Tetanus Toxoids Other  (See Comments)    Swollen and red ( patient informed that she had to take it in three doses)    SOCIAL HISTORY:  Social History   Tobacco Use  . Smoking status: Former Smoker    Packs/day: 1.00    Years: 15.00    Pack years: 15.00    Types: Cigarettes  . Smokeless tobacco: Never Used  . Tobacco comment: "quit smoking in the 1960s"  Substance Use Topics  . Alcohol use: Yes    Comment: 05/27/2015 "nothing in 1 month; I drank a couple glasses of wine/day before that"    FAMILY HISTORY: Family History  Problem Relation Age of Onset  . Cancer Mother   . Heart attack Father   . Cancer Maternal Grandmother   . Heart disease Paternal Grandmother   . Heart disease Paternal Grandfather     EXAM: BP (!) 146/87 (BP Location: Right Arm)   Pulse 66   Temp 98.7 F (37.1 C) (Oral)   Resp 16   SpO2 95%  CONSTITUTIONAL: Alert and oriented and responds appropriately to questions. Well-appearing; well-nourished HEAD: Normocephalic EYES: Conjunctivae clear, pupils appear equal, EOMI ENT: normal nose; moist mucous membranes NECK: Supple, no meningismus, no nuchal rigidity, no LAD  CARD: RRR; S1 and S2 appreciated; no murmurs, no clicks, no rubs, no gallops RESP: Normal chest excursion without splinting or tachypnea; breath sounds clear and equal bilaterally; no wheezes, no rhonchi,  no rales, no hypoxia or respiratory distress, speaking full sentences ABD/GI: Normal bowel sounds; non-distended; soft, non-tender, no rebound, no guarding, no peritoneal signs, no hepatosplenomegaly BACK:  The back appears normal and is non-tender to palpation, there is no CVA tenderness EXT: Normal ROM in all joints; non-tender to palpation; no edema; normal capillary refill; no cyanosis, no calf tenderness or swelling, amputation to the distal fourth left digit with bone exposure, 2+ left radial pulse, normal range of motion in all joints    SKIN: Normal color for age and race; warm; no rash NEURO: Moves all  extremities equally PSYCH: The patient's mood and manner are appropriate. Grooming and personal hygiene are appropriate.  MEDICAL DECISION MAKING: Patient here after distal amputation of the left fourth digit.  She brought to the tip of her finger and have discussed with patient that given this is a macerated lesion that we would not be able to put the fingertip back in place.  We will give her IV Unasyn today.  Will update her tetanus vaccination.  She states she does have a localized reaction to tetanus vaccination so we will give her Benadryl first but no anaphylactic reaction.  I feel the benefits outweigh the risks.  She agrees with this.  Will give pain medication.  Will discuss with hand surgery on-call.  ED PROGRESS: 1:50 AM  D/w Dr. Merlyn Lot with hand surgery.  He agrees to see patient in the morning.  Recommends that she stay n.p.o. and come to his office at 9 AM.  3:00 AM  Finger soaked in Betadine and saline for approximately 30 minutes.  Digital block performed with improved pain relief.  She did well after her tetanus vaccination with no sign of allergic reaction.  She agrees to follow-up with Dr. Estanislado Pandy agrees to stay n.p.o.  Granddaughter at bedside comfortable with plan and will take her home.  Will place in sterile, nonadherent, bulky dressing prior to discharge.   Celedonio Miyamoto Block Date/Time: 08/21/2018 3:05 AM Performed by: Ward, Layla Maw, DO Authorized by: Ward, Layla Maw, DO   Consent:    Consent obtained:  Verbal   Consent given by:  Patient   Risks discussed:  Unsuccessful block, pain, bleeding and infection   Alternatives discussed:  Alternative treatment Indications:    Indications:  Pain relief Location:    Body area:  Upper extremity   Upper extremity nerve blocked: left 4th digit.   Laterality:  Left Pre-procedure details:    Skin preparation:  Alcohol Skin anesthesia (see MAR for exact dosages):    Skin anesthesia method:  None Procedure details (see MAR for exact  dosages):    Block needle gauge:  24 G   Block anesthetic: 0.5% marcaine.   Steroid injected:  None   Additive injected:  None   Injection procedure:  Anatomic landmarks identified, negative aspiration for blood, incremental injection and introduced needle Post-procedure details:    Dressing:  Sterile dressing   Outcome:  Pain improved   Patient tolerance of procedure:  Tolerated well, no immediate complications     At this time, I do not feel there is any life-threatening condition present. I have reviewed and discussed all results (EKG, imaging, lab, urine as appropriate) and exam findings with patient/family. I have reviewed nursing notes and appropriate previous records.  I feel the patient is safe to be discharged home without further emergent workup and can continue workup as an outpatient as needed. Discussed usual and customary return precautions. Patient/family verbalize understanding and  are comfortable with this plan.  Outpatient follow-up has been provided as needed. All questions have been answered.    Ward, Layla Maw, DO 08/21/18 [phone]

## 2018-08-21 NOTE — Discharge Instructions (Signed)
Please go to Dr. Merrilee Seashore office at 9 AM.  Please do not have anything to eat or drink before going to his office.  If you need to take pain medication for pain prior to going to his office, you may take your pain pills with a small sip of water.  We have updated your tetanus vaccination today.  We have given you a dose of IV antibiotics.  Please keep your dressing on until you have seen the hand surgeon.

## 2018-08-21 NOTE — Op Note (Signed)
NAME: KAYA CAJAS MEDICAL RECORD NO: [id number] DATE OF BIRTH: Nov 09, 1933 FACILITY: [facility] LOCATION: [facility] PHYSICIAN: Tami Ribas, MD   OPERATIVE REPORT   DATE OF PROCEDURE: 08/21/18    PREOPERATIVE DIAGNOSIS:   Left ring finger amputation from dog bite and long finger dog bite   POSTOPERATIVE DIAGNOSIS:   Left ring finger amputation from dog bite and long finger dog bite   PROCEDURE:   1.  Left ring finger revision amputation 2.  Left long finger incision and drainage of dog bite including pad of distal phalanx   SURGEON:  Betha Loa, M.D.   ASSISTANT: none   ANESTHESIA:  General   INTRAVENOUS FLUIDS:  Per anesthesia flow sheet.   ESTIMATED BLOOD LOSS:  Minimal.   COMPLICATIONS:  None.   SPECIMENS:  none   TOURNIQUET TIME:    Total Tourniquet Time Documented: Upper Arm (Left) - 24 minutes Total: Upper Arm (Left) - 24 minutes    DISPOSITION:  Stable to PACU.   INDICATIONS: 85 year old female states she was breaking up a dog fight yesterday when the dog bit her long and ring fingers causing amputation of the tip of the ring finger and wounds to the long finger.  She was seen in the emergency department where the wounds were cleaned.  She was started on antibiotics.  Recommended revision amputation of the ring finger and incision and drainage of the long finger. Risks, benefits and alternatives of surgery were discussed including the risks of blood loss, infection, damage to nerves, vessels, tendons, ligaments, bone for surgery, need for additional surgery, complications with wound healing, continued pain, stiffness.  She voiced understanding of these risks and elected to proceed.  OPERATIVE COURSE:  After being identified preoperatively by myself,  the patient and I agreed on the procedure and site of the procedure.  The surgical site was marked.  Surgical consent had been signed. She was given IV antibiotics as preoperative antibiotic  prophylaxis. She was transferred to the operating room and placed on the operating table in supine position with the Left upper extremity on an arm board.  General anesthesia was induced by the anesthesiologist.  Left upper extremity was prepped and draped in normal sterile orthopedic fashion.  A surgical pause was performed between the surgeons, anesthesia, and operating room staff and all were in agreement as to the patient, procedure, and site of procedure.  Tourniquet at the proximal aspect of the extremity was inflated to 250 mmHg after exsanguination of the arm with an Esmarch bandage.    The wounds in the long finger were explored.  These coursed down into the pad of the finger.  The wound was extended on the ulnar side with the knife.  The septae of the pad were all broken using the scissors and a spreading technique.  No gross purulence was encountered.  In the ring finger the wound was explored.  Again no gross purulence.  No gross contamination.  The soft tissues were mobilized.  There was a small portion of nailbed remaining.  The nailbed and germinal matrix were excised including the dorsal nail groove.  The Aundria Rud were used to take the bone back to a stable base.  The flexor tendon was still inserted on the bone.  There was a fractured piece of bone at the ulnar side which was removed with the rongeurs and knife.  The amputation site and wounds of the long finger were copiously irrigated with sterile saline.  The soft  tissues were mobilized and able to be brought over the end of the bone and the ring finger.  5-0 Monocryl suture was used in an interrupted technique to reapproximate the soft tissues.  Good reapproximation was obtained without tension.  A vessel loop drain was placed in the wound to allow for drainage as necessary.  Iodoform packing was placed into the long finger wounds.  Digital blocks were performed with quarter percent plain Marcaine to aid in postoperative analgesia.  The long  finger wounds were dressed with sterile 4 x 4 and wrapped with a Coban dressing lightly.  The ring finger wound was dressed with sterile Xeroform 4 x 4's and wrapped lightly with a Coban dressing.  An AlumaFoam splint was placed and wrapped lightly with Coban dressing.  The tourniquet was deflated at 24 minutes.  Fingertips were pink with brisk capillary refill after deflation of tourniquet.  The operative  drapes were broken down.  The patient was awoken from anesthesia safely.  She was transferred back to the stretcher and taken to PACU in stable condition.  I will see her back in the office early next week for postoperative followup.  I will give her a prescription for Norco 5/325 1-2 tabs PO q6 hours prn pain, dispense # 20.   Betha Loa, MD Electronically signed, 08/21/18

## 2018-08-21 NOTE — Anesthesia Procedure Notes (Addendum)
Procedure Name: LMA Insertion Date/Time: 08/21/2018 2:15 PM Performed by: Ronnette Hila, CRNA Pre-anesthesia Checklist: Patient identified, Emergency Drugs available, Suction available and Patient being monitored Patient Re-evaluated:Patient Re-evaluated prior to induction Oxygen Delivery Method: Circle system utilized Preoxygenation: Pre-oxygenation with 100% oxygen Induction Type: IV induction Ventilation: Mask ventilation without difficulty LMA: LMA inserted LMA Size: 3.0 Number of attempts: 1 Airway Equipment and Method: Bite block Placement Confirmation: positive ETCO2 Tube secured with: Tape Dental Injury: Teeth and Oropharynx as per pre-operative assessment

## 2018-08-21 NOTE — Anesthesia Postprocedure Evaluation (Signed)
Anesthesia Post Note  Patient: RIATA SAYARATH  Procedure(s) Performed: REVISION AMPUTATION LEFT RING FINGER AND INCISION AND DRAINAGE LEFT LONG FINGER (Left Finger)     Patient location during evaluation: PACU Anesthesia Type: General Level of consciousness: awake and alert Pain management: pain level controlled Vital Signs Assessment: post-procedure vital signs reviewed and stable Respiratory status: spontaneous breathing, nonlabored ventilation and respiratory function stable Cardiovascular status: blood pressure returned to baseline and stable Postop Assessment: no apparent nausea or vomiting Anesthetic complications: no    Last Vitals:  Vitals:   08/21/18 1530 08/21/18 1558  BP: 134/80 (!) 150/80  Pulse: 69 72  Resp: 14 18  Temp:  36.6 C  SpO2: 98% 93%    Last Pain:  Vitals:   08/21/18 1558  TempSrc:   PainSc: 0-No pain                 Lowella Curb

## 2018-08-21 NOTE — Discharge Instructions (Addendum)

## 2018-08-24 ENCOUNTER — Encounter (HOSPITAL_BASED_OUTPATIENT_CLINIC_OR_DEPARTMENT_OTHER): Payer: Self-pay | Admitting: Orthopedic Surgery

## 2018-08-24 NOTE — Addendum Note (Signed)
Addendum  created 08/24/18 2527 by Tandy Lewin, Jewel Baize, CRNA   Charge Capture section accepted

## 2018-08-25 DIAGNOSIS — M79645 Pain in left finger(s): Secondary | ICD-10-CM | POA: Diagnosis not present

## 2018-08-27 DIAGNOSIS — M79645 Pain in left finger(s): Secondary | ICD-10-CM | POA: Diagnosis not present

## 2018-09-01 DIAGNOSIS — S61452A Open bite of left hand, initial encounter: Secondary | ICD-10-CM | POA: Diagnosis not present

## 2018-09-01 DIAGNOSIS — S68119A Complete traumatic metacarpophalangeal amputation of unspecified finger, initial encounter: Secondary | ICD-10-CM | POA: Diagnosis not present

## 2018-09-01 DIAGNOSIS — W540XXA Bitten by dog, initial encounter: Secondary | ICD-10-CM | POA: Diagnosis not present

## 2018-09-01 DIAGNOSIS — M79645 Pain in left finger(s): Secondary | ICD-10-CM | POA: Diagnosis not present

## 2018-09-04 DIAGNOSIS — W540XXA Bitten by dog, initial encounter: Secondary | ICD-10-CM | POA: Diagnosis not present

## 2018-09-04 DIAGNOSIS — M79645 Pain in left finger(s): Secondary | ICD-10-CM | POA: Diagnosis not present

## 2018-09-04 DIAGNOSIS — S61452A Open bite of left hand, initial encounter: Secondary | ICD-10-CM | POA: Diagnosis not present

## 2018-09-07 DIAGNOSIS — M79645 Pain in left finger(s): Secondary | ICD-10-CM | POA: Diagnosis not present

## 2018-09-10 DIAGNOSIS — M79645 Pain in left finger(s): Secondary | ICD-10-CM | POA: Diagnosis not present

## 2018-09-15 DIAGNOSIS — M79645 Pain in left finger(s): Secondary | ICD-10-CM | POA: Diagnosis not present

## 2018-09-15 DIAGNOSIS — M25642 Stiffness of left hand, not elsewhere classified: Secondary | ICD-10-CM | POA: Diagnosis not present

## 2018-09-24 DIAGNOSIS — M79645 Pain in left finger(s): Secondary | ICD-10-CM | POA: Diagnosis not present

## 2018-09-24 DIAGNOSIS — M25642 Stiffness of left hand, not elsewhere classified: Secondary | ICD-10-CM | POA: Diagnosis not present

## 2018-09-29 ENCOUNTER — Telehealth: Payer: Self-pay | Admitting: Cardiology

## 2018-09-29 NOTE — Telephone Encounter (Signed)
Rescheduled patient's appointment in April to February 12, 2019.

## 2018-10-01 DIAGNOSIS — M79645 Pain in left finger(s): Secondary | ICD-10-CM | POA: Diagnosis not present

## 2018-10-01 DIAGNOSIS — M25642 Stiffness of left hand, not elsewhere classified: Secondary | ICD-10-CM | POA: Diagnosis not present

## 2018-10-02 ENCOUNTER — Ambulatory Visit: Payer: Medicare Other | Admitting: Cardiology

## 2018-10-09 DIAGNOSIS — M25642 Stiffness of left hand, not elsewhere classified: Secondary | ICD-10-CM | POA: Diagnosis not present

## 2018-10-19 DIAGNOSIS — M79645 Pain in left finger(s): Secondary | ICD-10-CM | POA: Diagnosis not present

## 2018-10-19 DIAGNOSIS — M25642 Stiffness of left hand, not elsewhere classified: Secondary | ICD-10-CM | POA: Diagnosis not present

## 2019-02-09 ENCOUNTER — Telehealth: Payer: Self-pay | Admitting: *Deleted

## 2019-02-09 NOTE — Telephone Encounter (Signed)
Called unable to leave message - voicemail has not been set up   calling to switch in office visit to virtual visit  02/12/19

## 2019-02-12 ENCOUNTER — Ambulatory Visit: Payer: Medicare Other | Admitting: Cardiology

## 2019-02-16 NOTE — Telephone Encounter (Signed)
SPOKE TO PATIENT . SHE WANTED TO CHANGE TO IN OFFICE VISIT APPOINTMENT CHANGED TO ANOTHER DAY.

## 2019-02-26 ENCOUNTER — Other Ambulatory Visit: Payer: Self-pay

## 2019-02-26 ENCOUNTER — Ambulatory Visit (INDEPENDENT_AMBULATORY_CARE_PROVIDER_SITE_OTHER): Payer: Medicare Other | Admitting: Cardiology

## 2019-02-26 VITALS — BP 138/81 | HR 70 | Temp 97.0°F | Ht 62.0 in | Wt 134.2 lb

## 2019-02-26 DIAGNOSIS — E782 Mixed hyperlipidemia: Secondary | ICD-10-CM

## 2019-02-26 DIAGNOSIS — R42 Dizziness and giddiness: Secondary | ICD-10-CM | POA: Diagnosis not present

## 2019-02-26 DIAGNOSIS — I5181 Takotsubo syndrome: Secondary | ICD-10-CM

## 2019-02-26 DIAGNOSIS — I10 Essential (primary) hypertension: Secondary | ICD-10-CM | POA: Diagnosis not present

## 2019-02-26 DIAGNOSIS — I2510 Atherosclerotic heart disease of native coronary artery without angina pectoris: Secondary | ICD-10-CM | POA: Diagnosis not present

## 2019-02-26 NOTE — Patient Instructions (Signed)
Medication Instructions:   no changes  If you need a refill on your cardiac medications before your next appointment, please call your pharmacy.   Lab work: Not needed If you have labs (blood work) drawn today and your tests are completely normal, you will receive your results only by: Marland Kitchen MyChart Message (if you have MyChart) OR . A paper copy in the mail If you have any lab test that is abnormal or we need to change your treatment, we will call you to review the results.  Testing/Procedures:  Not needed Follow-Up: At [facility], you and your health needs are our priority.  As part of our continuing mission to provide you with exceptional heart care, we have created designated Provider Care Teams.  These Care Teams include your primary Cardiologist (physician) and Advanced Practice Providers (APPs -  Physician Assistants and Nurse Practitioners) who all work together to provide you with the care you need, when you need it. . You will need a follow up appointment in  12 months Sept 2021.  Please call our office 2 months in advance to schedule this appointment.  You may see Glenetta Hew, MD or one of the following Advanced Practice Providers on your designated Care Team:   . Rosaria Ferries, PA-C . Jory Sims, DNP, ANP  Any Other Special Instructions Will Be Listed Below (If Applicable).

## 2019-02-26 NOTE — Progress Notes (Signed)
PCP: Gaspar Garbeisovec, Richard W, MD  Clinic Note: Chief Complaint  Patient presents with  . Follow-up    2 year; distant h/o Takotsubo CM - Resolved  . Hypertension    HPI: Rita Harding is a 85 y.o. female with a distant h/o Takotsubo CM who presents today for ~2 yr f/u.  She is a former patient of Dr. Donia Guilesichard Weintraub(And before that Dr. Shirlee LatchMcLean) with a history of likely nonischemic cardiomyopathy dating back to 2007. Presumably she had an episode of Takotsubo cardiomyopathy. Cardiac cath showed 50-60% LAD lesion with some bridging, otherwise normal coronaries. By September 2013 she had an echo demonstrating EF of back to 70%.   Rita Harding was last seen back in June 2018 for what was a 3-year follow-up at that time.  She indicated that she was doing quite well.  Her husband just died after a long bout of progressive dementia.  Since that happened, she was able to get back in with exercise regimen and started feeling more healthy.  No cardiac symptoms of angina or heart failure.  Recent Hospitalizations: She went to the ER/urgent care for a traumatic amputation where her dog bit off the end of her finger.  This had some complication, but is otherwise now healed up with the last portion of the digit amputated.  Studies Personally Reviewed - (if available, images/films reviewed: From Epic Chart or Care Everywhere)  None  Interval History: Rita Harding returns today again doing well for quite standpoint.  She has not had any symptoms whatsoever of PND, orthopnea or edema.  No resting exertional dyspnea or even chest pain.  What she does note though is having occasional episodes where she will feel lightheaded and dizzy while trying exercise, or when it is little higher than usual.  She notes that her heart rate goes up, but does not notice any rapid irregular palpitations.  Although she is dizzy, she denies any syncope or near syncope.  No TIA or amaurosis fugax.   In talking to her, it  appears that she does not really do a good job making sure that she drinks enough fluid prior to doing her exercise, and tries to catch up while exercising.  No claudication.   Review of Systems  Constitutional: Negative for malaise/fatigue and weight loss.  HENT: Negative for congestion and nosebleeds.   Respiratory: Negative for shortness of breath and wheezing.   Gastrointestinal: Negative for abdominal pain, blood in stool, heartburn, melena and nausea.  Genitourinary: Negative for hematuria.  Musculoskeletal: Joint pain: No real OA symptoms.  Neurological: Positive for dizziness (Noted in HPI.). Negative for tingling, focal weakness, weakness and headaches.  Psychiatric/Behavioral: Depression: Doing well after her husband's death.  Seems more active and free to care for herself.       She is just a little bit "bummed out" because of the COVID-19 restrictions keeping her away from family and friends.     The patient does not have symptoms concerning for COVID-19 infection (fever, chills, cough, or new shortness of breath).  The patient is practicing social distancing.   COVID-19 Education: The signs and symptoms of COVID-19 were discussed with the patient and how to seek care for testing (follow up with PCP or arrange E-visit).   The importance of social distancing was discussed today.   I have reviewed and (if needed) personally updated the patient's problem list, medications, allergies, past medical and surgical history, social and family history.   Past Medical History:  Diagnosis Date  .  Coronary artery disease 2007   non-critcal coronary single disease  . History of DVT (deep vein thrombosis)    "LLE"; BEEN OFF  COUMADIN SINCE Sept 2012  . Hyperlipidemia   . Hypertension   . MI (myocardial infarction) (HCC) 2007   Thought to be takotsubo cardiomyopathy  . Takotsubo cardiomyopathy 2007   Result with normal EF as OF 2013    Past Surgical History:  Procedure  Laterality Date  . AMPUTATION Left 08/21/2018   Procedure: REVISION AMPUTATION LEFT RING FINGER AND INCISION AND DRAINAGE LEFT LONG FINGER;  Surgeon: Betha Loa, MD;  Location: [facility];  Service: Orthopedics;  Laterality: Left;  . APPENDECTOMY    . CARDIAC CATHETERIZATION  01/03/2006    50-60% LAD with some myocardial  bridging; NON ISCHEMIC  CARDIOMYOPATHY  with EF  30 to 35% no evidence of infarct -- anteroapical and inferapical wall motin abnormalities compatilble with Takotsubo-type syndrome  . DOPPLER LEFT LOWER EXTREMITY ([facility] HX) Left 01/29/2011   left saphenous vein -small amt of chroinc appearing calicification on anterior wall that was nonobstructing,mildly abnormal   . DOPPLER RIGHT ADDITIONAL EXTR ([facility] HX) Right 01/29/2011  . FIBULA FRACTURE SURGERY Left 2009   UNDER CARE DR Beverely Low  . FRACTURE SURGERY    . NM MYOVIEW LTD  02/21/2012   low risk  and normal EF 70% or greater  . TONSILLECTOMY    . TRANSTHORACIC ECHOCARDIOGRAM  07/31/2010   no MVP ,NORMAL SYSTOLIC FUNCTION (EF >55%). Mild Ao Sclerosis.      Current Meds  Medication Sig  . aspirin EC 81 MG tablet Take 81 mg by mouth daily.  Marland Kitchen atorvastatin (LIPITOR) 80 MG tablet   . benazepril (LOTENSIN) 10 MG tablet Take 10 mg by mouth daily.  . carvedilol (COREG) 12.5 MG tablet Take 12.5 mg by mouth 2 (two) times daily with a meal.  . Multiple Vitamin (MULTIVITAMIN) tablet Take 1 tablet by mouth daily.    Allergies  Allergen Reactions  . Codeine Nausea And Vomiting  . Tetanus Toxoids Other (See Comments)    Swollen and red ( patient informed that she had to take it in three doses)    Social History   Tobacco Use  . Smoking status: Former Smoker    Packs/day: 1.00    Years: 15.00    Pack years: 15.00    Types: Cigarettes  . Smokeless tobacco: Never Used  . Tobacco comment: "quit smoking in the 1960s"  Substance Use Topics  . Alcohol use: Yes    Alcohol/week: 2.0 standard drinks    Types:  2 Glasses of wine per week  . Drug use: No   Social History   Social History Narrative  . Not on file    family history includes Cancer in her maternal grandmother and mother; Heart attack in her father; Heart disease in her paternal grandfather and paternal grandmother.  Wt Readings from Last 3 Encounters:  02/26/19 134 lb 3.2 oz (60.9 kg)  08/21/18 135 lb 2.3 oz (61.3 kg)  11/28/16 130 lb (59 kg)    PHYSICAL EXAM BP 138/81   Pulse 70   Temp (!) 97 F (36.1 C)   Ht 5\' 2"  (1.575 m)   Wt 134 lb 3.2 oz (60.9 kg)   SpO2 93%   BMI 24.55 kg/m  Physical Exam  Constitutional: She is oriented to person, place, and time. She appears well-developed and well-nourished. No distress.  HENT:  Head: Normocephalic and atraumatic.  Neck: Normal  range of motion. Neck supple. No JVD present.  Cardiovascular: Normal rate, regular rhythm, S1 normal, S2 normal and intact distal pulses.  No extrasystoles are present. PMI is not displaced. Exam reveals no gallop and no friction rub.  Murmur (2/6 SEM @ RUSB -> carotids) heard. Pulmonary/Chest: Breath sounds normal. No respiratory distress. She has no wheezes. She has no rales.  Nonlabored.  Good air movement  Musculoskeletal: Normal range of motion.        General: No edema.  Neurological: She is alert and oriented to person, place, and time.  Psychiatric: She has a normal mood and affect. Her behavior is normal. Judgment and thought content normal.  Vitals reviewed.    Adult ECG Report  Rate: 67 ;  Rhythm: normal sinus rhythm and FH enlargement.  Left axis deviation (-44-borderline LAFB), LVH with repolarization normality.  Cannot exclude septal infarct, age undetermined but unlikely.;   Narrative Interpretation: Stable EKG.   Other studies Reviewed: Additional studies/ records that were reviewed today include:  Recent Labs: Last labs were from November 2019: TC 176, HDL 64 LDL 87, TG 123.  A1c not checked.  Cr 0.79.  Hgb 12.1.    ASSESSMENT / PLAN: Problem List Items Addressed This Visit    Coronary artery disease (Chronic)    Moderate nonobstructive disease on cath years ago.  Has not had any anginal symptoms.  Is on a beta-blocker, ACE inhibitor and aspirin.  Not currently on statin.  Lipids are pretty well controlled.      Relevant Medications   atorvastatin (LIPITOR) 80 MG tablet   Other Relevant Orders   EKG 12-Lead   Hypertension - Primary (Chronic)    Borderline blood pressure today, but would allow for some mild permissive hypertension since she is having some dizziness.  She is on stable dose of benazepril and carvedilol.  No change for now.      Relevant Medications   atorvastatin (LIPITOR) 80 MG tablet   Other Relevant Orders   EKG 12-Lead   Hyperlipidemia (Chronic)    Being followed and monitored by PCP.  I think the target LDL should be less than 100.  As of November 2019, LDL was 87.  This is on no medications.  Given her age and how well she is doing, would not add any additional medication.      Relevant Medications   atorvastatin (LIPITOR) 80 MG tablet   Takotsubo cardiomyopathy    This was a long time ago.  Follow-up echo in 2013 showed EF was back to normal.  She continues to do well.  Is not having any further symptoms of heart failure or angina. For now the plan will be to simply control her blood pressure as she does have some LVH on EKG.      Relevant Medications   atorvastatin (LIPITOR) 80 MG tablet   Other Relevant Orders   EKG 12-Lead   Orthostatic dizziness    Stressed the importance of adequate hydration prior to and after exercise as well as during exercise.   It is also important that in addition to drinking fluids, she has to eat enough to maintain her volume status.          I spent a total of 20 minutes with the patient and chart review. >  50% of the time was spent in direct patient consultation.   Current medicines are reviewed at length with the patient today.   (+/- concerns) none  Patient Instructions  Medication Instructions:  no changes  If you need a refill on your cardiac medications before your next appointment, please call your pharmacy.   Lab work: Not needed If you have labs (blood work) drawn today and your tests are completely normal, you will receive your results only by: Marland Kitchen. MyChart Message (if you have MyChart) OR . A paper copy in the mail If you have any lab test that is abnormal or we need to change your treatment, we will call you to review the results.  Testing/Procedures:  Not needed Follow-Up: At [facility], you and your health needs are our priority.  As part of our continuing mission to provide you with exceptional heart care, we have created designated Provider Care Teams.  These Care Teams include your primary Cardiologist (physician) and Advanced Practice Providers (APPs -  Physician Assistants and Nurse Practitioners) who all work together to provide you with the care you need, when you need it. . You will need a follow up appointment in  12 months Sept 2021.  Please call our office 2 months in advance to schedule this appointment.  You may see Bryan Lemmaavid , MD or one of the following Advanced Practice Providers on your designated Care Team:   . Theodore DemarkRhonda Barrett, PA-C . Joni ReiningKathryn Lawrence, DNP, ANP  Any Other Special Instructions Will Be Listed Below (If Applicable).    Studies Ordered:   Orders Placed This Encounter  Procedures  . EKG 12-Lead      Bryan Lemmaavid , M.D., M.S. Interventional Cardiologist   Pager # [phone] Phone # [phone] [address] [place], [place] [address]   Thank you for choosing [facility] at [facility]!!

## 2019-03-01 ENCOUNTER — Encounter: Payer: Self-pay | Admitting: Cardiology

## 2019-03-01 DIAGNOSIS — R42 Dizziness and giddiness: Secondary | ICD-10-CM | POA: Insufficient documentation

## 2019-03-01 NOTE — Assessment & Plan Note (Signed)
This was a long time ago.  Follow-up echo in 2013 showed EF was back to normal.  She continues to do well.  Is not having any further symptoms of heart failure or angina. For now the plan will be to simply control her blood pressure as she does have some LVH on EKG.

## 2019-03-01 NOTE — Assessment & Plan Note (Signed)
Moderate nonobstructive disease on cath years ago.  Has not had any anginal symptoms.  Is on a beta-blocker, ACE inhibitor and aspirin.  Not currently on statin.  Lipids are pretty well controlled.

## 2019-03-01 NOTE — Assessment & Plan Note (Signed)
Being followed and monitored by PCP.  I think the target LDL should be less than 100.  As of November 2019, LDL was 87.  This is on no medications.  Given her age and how well she is doing, would not add any additional medication.

## 2019-03-01 NOTE — Assessment & Plan Note (Signed)
Stressed the importance of adequate hydration prior to and after exercise as well as during exercise.   It is also important that in addition to drinking fluids, she has to eat enough to maintain her volume status.

## 2019-03-01 NOTE — Assessment & Plan Note (Signed)
Borderline blood pressure today, but would allow for some mild permissive hypertension since she is having some dizziness.  She is on stable dose of benazepril and carvedilol.  No change for now.

## 2019-03-24 DIAGNOSIS — Z23 Encounter for immunization: Secondary | ICD-10-CM | POA: Diagnosis not present

## 2019-04-30 DIAGNOSIS — E7800 Pure hypercholesterolemia, unspecified: Secondary | ICD-10-CM | POA: Diagnosis not present

## 2019-04-30 DIAGNOSIS — I1310 Hypertensive heart and chronic kidney disease without heart failure, with stage 1 through stage 4 chronic kidney disease, or unspecified chronic kidney disease: Secondary | ICD-10-CM | POA: Diagnosis not present

## 2019-07-05 ENCOUNTER — Ambulatory Visit: Payer: Medicare Other | Admitting: Internal Medicine

## 2019-07-19 ENCOUNTER — Ambulatory Visit (INDEPENDENT_AMBULATORY_CARE_PROVIDER_SITE_OTHER): Payer: Medicare Other | Admitting: Internal Medicine

## 2019-07-19 ENCOUNTER — Encounter: Payer: Self-pay | Admitting: Internal Medicine

## 2019-07-19 ENCOUNTER — Other Ambulatory Visit: Payer: Self-pay

## 2019-07-19 VITALS — BP 132/86 | HR 73 | Temp 98.3°F | Resp 16 | Ht 62.0 in | Wt 137.0 lb

## 2019-07-19 DIAGNOSIS — I10 Essential (primary) hypertension: Secondary | ICD-10-CM | POA: Diagnosis not present

## 2019-07-19 DIAGNOSIS — I2510 Atherosclerotic heart disease of native coronary artery without angina pectoris: Secondary | ICD-10-CM | POA: Diagnosis not present

## 2019-07-19 NOTE — Progress Notes (Signed)
Subjective:  Patient ID: Rita Harding, female    DOB: 07/15/1933  Age: 86 y.o. MRN: [id number]  CC: Hypertension and Coronary Artery Disease  This visit occurred during the SARS-CoV-2 public health emergency.  Safety protocols were in place, including screening questions prior to the visit, additional usage of staff PPE, and extensive cleaning of exam room while observing appropriate contact time as indicated for disinfecting solutions.    HPI RUCHA WISSINGER presents for establishing as a new pt. She fells well and offers no complaints. She is active and denies any recent episodes of CP, DOE, fatigue, edema. She had labs done by her previous PCP about 3 months ago  History Mayu has a past medical history of Coronary artery disease (2007), History of DVT (deep vein thrombosis), Hyperlipidemia, Hypertension, MI (myocardial infarction) (HCC) (2007), and Takotsubo cardiomyopathy (2007).   She has a past surgical history that includes Appendectomy; Tonsillectomy; NM MYOVIEW LTD (02/21/2012); transthoracic echocardiogram (07/31/2010); DOPPLER LEFT LOWER EXTREMITY ([facility] HX) (Left, 01/29/2011); DOPPLER RIGHT ADDITIONAL EXTR ([facility] HX) (Right, 01/29/2011); Fibula Fracture Surgery (Left, 2009); Fracture surgery; Cardiac catheterization (01/03/2006); and Amputation (Left, 08/21/2018).   Her family history includes Cancer in her maternal grandmother and mother; Heart attack in her father; Heart disease in her paternal grandfather and paternal grandmother.She reports that she has quit smoking. Her smoking use included cigarettes. She has a 15.00 pack-year smoking history. She has never used smokeless tobacco. She reports current alcohol use of about 25.0 standard drinks of alcohol per week. She reports that she does not use drugs.  Outpatient Medications Prior to Visit  Medication Sig Dispense Refill  . aspirin EC 81 MG tablet Take 81 mg by mouth daily.    Marland Kitchen atorvastatin (LIPITOR) 80 MG tablet       . benazepril (LOTENSIN) 10 MG tablet Take 10 mg by mouth daily.    . carvedilol (COREG) 12.5 MG tablet Take 12.5 mg by mouth 2 (two) times daily with a meal.    . Multiple Vitamin (MULTIVITAMIN) tablet Take 1 tablet by mouth daily.     No facility-administered medications prior to visit.    ROS Review of Systems  Constitutional: Negative for diaphoresis, fatigue and unexpected weight change.  HENT: Negative.   Eyes: Negative.   Respiratory: Negative for cough, chest tightness, shortness of breath and wheezing.   Cardiovascular: Negative for chest pain, palpitations and leg swelling.  Gastrointestinal: Negative for abdominal pain, constipation, diarrhea, nausea and vomiting.  Endocrine: Negative.   Genitourinary: Negative.  Negative for difficulty urinating.  Musculoskeletal: Negative for arthralgias and myalgias.  Skin: Negative.  Negative for color change.  Neurological: Negative.  Negative for dizziness, weakness, light-headedness and headaches.  Hematological: Negative.   Psychiatric/Behavioral: Negative.     Objective:  BP 132/86 (BP Location: Left Arm, Patient Position: Sitting, Cuff Size: Small)   Pulse 73   Temp 98.3 F (36.8 C) (Oral)   Resp 16   Ht 5\' 2"  (1.575 m)   Wt 137 lb (62.1 kg)   SpO2 97%   BMI 25.06 kg/m   Physical Exam Vitals reviewed.  Constitutional:      Appearance: Normal appearance.  HENT:     Nose: Nose normal.     Mouth/Throat:     Mouth: Mucous membranes are moist.  Eyes:     General: No scleral icterus.    Conjunctiva/sclera: Conjunctivae normal.  Cardiovascular:     Rate and Rhythm: Normal rate and regular rhythm.     Heart  sounds: No murmur.  Pulmonary:     Effort: Pulmonary effort is normal.     Breath sounds: No stridor. No wheezing, rhonchi or rales.  Abdominal:     General: Abdomen is flat. Bowel sounds are normal.     Palpations: There is no hepatomegaly, splenomegaly or mass.     Tenderness: There is no abdominal  tenderness.  Musculoskeletal:        General: Normal range of motion.     Cervical back: Neck supple.     Right lower leg: No edema.     Left lower leg: No edema.  Lymphadenopathy:     Cervical: No cervical adenopathy.  Skin:    General: Skin is warm and dry.     Coloration: Skin is not pale.  Neurological:     General: No focal deficit present.     Mental Status: She is alert.  Psychiatric:        Mood and Affect: Mood normal.        Behavior: Behavior normal.     Lab Results  Component Value Date   WBC 9.0 05/30/2015   HGB 10.3 (L) 05/30/2015   HCT 31.5 (L) 05/30/2015   PLT 392 05/30/2015   GLUCOSE 112 (H) 05/31/2015   ALT 38 05/28/2015   AST 40 05/28/2015   NA 137 05/31/2015   K 4.4 05/31/2015   CL 105 05/31/2015   CREATININE 0.47 05/31/2015   BUN <5 (L) 05/31/2015   CO2 24 05/31/2015   INR 1.34 05/27/2015    Assessment & Plan:   Verdie was seen today for hypertension and coronary artery disease.  Diagnoses and all orders for this visit:  Coronary artery disease involving native coronary artery of native heart without angina pectoris- She is free of angina. Will continue risk factor modifications.  Essential hypertension- Her BP is well controlled. No labs today - I requested recent labs by her previous PCP.   I am having Roselee Nova maintain her carvedilol, aspirin EC, benazepril, multivitamin, and atorvastatin.  No orders of the defined types were placed in this encounter.    Follow-up: Return in about 6 months (around 01/16/2020).  Scarlette Calico, MD

## 2019-07-19 NOTE — Patient Instructions (Signed)
Coronary Artery Disease, Female Coronary artery disease (CAD) is a condition in which the arteries that lead to the heart (coronary arteries) become narrow or blocked. The narrowing or blockage can lead to decreased blood flow to the heart. Prolonged reduced blood flow can cause a heart attack (myocardial infarction or MI). This condition may also be called coronary heart disease. Because CAD is the leading cause of death in women, it is important to understand what causes this condition and how it is treated. What are the causes? CAD is most often caused by atherosclerosis. This is the buildup of fat and cholesterol (plaque) on the inside of the arteries. Over time, the plaque may narrow or block the artery, reducing blood flow to the heart. Plaque can also become weak and break off within a coronary artery and cause a sudden blockage. Other less common causes of CAD include:  A blood clot or a piece of a blood clot or other substance that blocks the flow of blood in a coronary artery (embolism).  A tearing of the artery (spontaneous coronary artery dissection).  An enlargement of an artery (aneurysm).  Inflammation (vasculitis) in the artery wall. What increases the risk? The following factors may make you more likely to develop this condition:  Age. Women over age 55 are at a greater risk of CAD.  Family history of CAD.  High blood pressure (hypertension).  Diabetes.  High cholesterol levels.  Tobacco use.  Lack of exercise.  Menopause. ? All postmenopausal women are at greater risk of CAD. ? Women who have experienced menopause between the ages of 40-45 (early menopause) are at a higher risk of CAD. ? Women who have experienced menopause before age 40 (premature menopause) are at a very high risk of CAD.  Excessive alcohol use.  A diet high in saturated and trans fats, such as fried food and processed meat. Other possible risk factors include:  High stress  levels.  Depression.  Obesity.  Sleep apnea. What are the signs or symptoms? Many people do not have any symptoms during the early stages of CAD. As the condition progresses, symptoms may include:  Chest pain (angina). The pain can: ? Feel like crushing or squeezing, or like a tightness, pressure, fullness, or heaviness in the chest. ? Last more than a few minutes or can stop and recur. The pain tends to get worse with exercise or stress and to fade with rest.  Pain in the arms, neck, jaw, ear, or back.  Unexplained heartburn or indigestion.  Shortness of breath.  Nausea.  Sudden cold sweats.  Sudden light-headedness.  Fluttering or fast heartbeat (palpitations). Many women have chest discomfort and the other symptoms. However, women often have unusual (atypical) symptoms, such as:  Fatigue.  Vomiting.  Unexplained feelings of nervousness or anxiety.  Unexplained weakness.  Dizziness or fainting. How is this diagnosed? This condition is diagnosed based on:  Your family and medical history.  A physical exam.  Tests, including: ? A test to check the electrical signals in your heart (electrocardiogram). ? Exercise stress test. This looks for signs of blockage when the heart is stressed with exercise, such as running on a treadmill. ? Pharmacologic stress test. This test looks for signs of blockage when the heart is being stressed with a medicine. ? Blood tests. ? Coronary angiogram. This is a procedure to look at the coronary arteries to see if there is any blockage. During this test, a dye is injected into your arteries so they   appear on an X-ray. ? Coronary artery CT scan. This CT scan helps detect calcium deposits in your coronary arteries. Calcium deposits are an indicator of CAD. ? A test that uses sound waves to take a picture of your heart (echocardiogram). ? Chest X-ray. How is this treated? This condition may be treated by:  Healthy lifestyle changes to  reduce risk factors.  Medicines such as: ? Antiplatelet medicines and blood-thinning medicines, such as aspirin. These help to prevent blood clots. ? Nitroglycerin. ? Blood pressure medicines. ? Cholesterol-lowering medicine.  Coronary angioplasty and stenting. During this procedure, a thin, flexible tube is inserted through a blood vessel and into a blocked artery. A balloon or similar device on the end of the tube is inflated to open up the artery. In some cases, a small, mesh tube (stent) is inserted into the artery to keep it open.  Coronary artery bypass surgery. During this surgery, veins or arteries from other parts of the body are used to create a bypass around the blockage and allow blood to reach your heart. Follow these instructions at home: Medicines  Take over-the-counter and prescription medicines only as told by your health care provider.  Do not take the following medicines unless your health care provider approves: ? NSAIDs, such as ibuprofen, naproxen, or celecoxib. ? Vitamin supplements that contain vitamin A, vitamin E, or both. ? Hormone replacement therapy that contains estrogen with or without progestin. Lifestyle  Follow an exercise program approved by your health care provider. Aim for 150 minutes of moderate exercise or 75 minutes of vigorous exercise each week.  Maintain a healthy weight or lose weight as approved by your health care provider.  Learn to manage stress or try to limit your stress. Ask your health care provider for suggestions if you need help.  Get screened for depression and seek treatment, if needed.  Do not use any products that contain nicotine or tobacco, such as cigarettes, e-cigarettes, and chewing tobacco. If you need help quitting, ask your health care provider.  Do not use illegal drugs. Eating and drinking   Follow a heart-healthy diet. A dietitian can help educate you about healthy food options and changes. In general, eat  plenty of fruits and vegetables, lean meats, and whole grains.  Avoid foods high in: ? Sugar. ? Salt (sodium). ? Saturated fats, such as processed or fatty meat. ? Trans fats, such as fried food.  Use healthy cooking methods such as roasting, grilling, broiling, baking, poaching, steaming, or stir-frying.  Do not drink alcohol if: ? Your health care provider tells you not to drink. ? You are pregnant, may be pregnant, or are planning to become pregnant.  If you drink alcohol: ? Limit how much you have to 0-1 drink a day. ? Be aware of how much alcohol is in your drink. In the [place] one drink equals one 12 oz bottle of beer (355 mL), one 5 oz glass of wine (148 mL), or one 1 oz glass of hard liquor (44 mL). General instructions  Manage any other health conditions, such as hypertension and diabetes. These conditions affect your heart.  Your health care provider may ask you to monitor your blood pressure. Ideally, your blood pressure should be below 130/80.  Keep all follow-up visits as told by your health care provider. This is important. Get help right away if:  You have pain in your chest, neck, ear, arm, jaw, stomach, or back that: ? Lasts more than a few minutes. ?   Is recurring. ? Is not relieved by taking medicine under your tongue (sublingual nitroglycerin).  You have profuse sweating without cause.  You have unexplained: ? Heartburn or indigestion. ? Shortness of breath or difficulty breathing. ? Fluttering or fast heartbeat (palpitations). ? Nausea or vomiting. ? Fatigue. ? Feelings of nervousness or anxiety. ? Weakness. ? Diarrhea.  You have sudden light-headedness or dizziness.  You faint.  You feel like hurting yourself or think about taking your own life. These symptoms may represent a serious problem that is an emergency. Do not wait to see if the symptoms will go away. Get medical help right away. Call your local emergency services (911 in the [place] Do  not drive yourself to the hospital. Summary  Coronary artery disease (CAD) is a condition in which the arteries that lead to the heart (coronary arteries) become narrow or blocked. The narrowing or blockage can lead to a heart attack.  Many women have chest discomfort and other common symptoms of CAD. However, women often have unusual (atypical) symptoms, such as fatigue, vomiting, weakness, or dizziness.  CAD can be treated with lifestyle changes, medicines, surgery, or a combination of these treatments. This information is not intended to replace advice given to you by your health care provider. Make sure you discuss any questions you have with your health care provider. Document Revised: 02/13/2018 Document Reviewed: 02/03/2018 Elsevier Patient Education  2020 [employer]  

## 2019-07-20 ENCOUNTER — Encounter: Payer: Self-pay | Admitting: Internal Medicine

## 2019-07-22 ENCOUNTER — Emergency Department (HOSPITAL_COMMUNITY)
Admission: EM | Admit: 2019-07-22 | Discharge: 2019-07-22 | Disposition: A | Discharge status: Home / Self Care | Payer: Medicare Other | Attending: Emergency Medicine | Admitting: Emergency Medicine

## 2019-07-22 ENCOUNTER — Other Ambulatory Visit: Payer: Self-pay

## 2019-07-22 ENCOUNTER — Emergency Department (HOSPITAL_COMMUNITY): Payer: Medicare Other

## 2019-07-22 ENCOUNTER — Other Ambulatory Visit (HOSPITAL_COMMUNITY): Payer: Medicare Other

## 2019-07-22 ENCOUNTER — Encounter (HOSPITAL_COMMUNITY): Payer: Self-pay | Admitting: Emergency Medicine

## 2019-07-22 DIAGNOSIS — Y92018 Other place in single-family (private) house as the place of occurrence of the external cause: Secondary | ICD-10-CM | POA: Insufficient documentation

## 2019-07-22 DIAGNOSIS — Y9389 Activity, other specified: Secondary | ICD-10-CM | POA: Diagnosis not present

## 2019-07-22 DIAGNOSIS — I2510 Atherosclerotic heart disease of native coronary artery without angina pectoris: Secondary | ICD-10-CM | POA: Diagnosis not present

## 2019-07-22 DIAGNOSIS — I252 Old myocardial infarction: Secondary | ICD-10-CM | POA: Diagnosis not present

## 2019-07-22 DIAGNOSIS — S61011A Laceration without foreign body of right thumb without damage to nail, initial encounter: Secondary | ICD-10-CM | POA: Diagnosis not present

## 2019-07-22 DIAGNOSIS — W01198A Fall on same level from slipping, tripping and stumbling with subsequent striking against other object, initial encounter: Secondary | ICD-10-CM | POA: Diagnosis not present

## 2019-07-22 DIAGNOSIS — S63124A Dislocation of unspecified interphalangeal joint of right thumb, initial encounter: Secondary | ICD-10-CM | POA: Diagnosis not present

## 2019-07-22 DIAGNOSIS — S63104A Unspecified dislocation of right thumb, initial encounter: Secondary | ICD-10-CM | POA: Diagnosis not present

## 2019-07-22 DIAGNOSIS — S6991XA Unspecified injury of right wrist, hand and finger(s), initial encounter: Secondary | ICD-10-CM | POA: Diagnosis present

## 2019-07-22 DIAGNOSIS — S63124D Dislocation of unspecified interphalangeal joint of right thumb, subsequent encounter: Secondary | ICD-10-CM | POA: Diagnosis not present

## 2019-07-22 DIAGNOSIS — M25641 Stiffness of right hand, not elsewhere classified: Secondary | ICD-10-CM | POA: Diagnosis not present

## 2019-07-22 DIAGNOSIS — Y998 Other external cause status: Secondary | ICD-10-CM | POA: Insufficient documentation

## 2019-07-22 DIAGNOSIS — I10 Essential (primary) hypertension: Secondary | ICD-10-CM | POA: Insufficient documentation

## 2019-07-22 DIAGNOSIS — Z79899 Other long term (current) drug therapy: Secondary | ICD-10-CM | POA: Insufficient documentation

## 2019-07-22 DIAGNOSIS — Z7982 Long term (current) use of aspirin: Secondary | ICD-10-CM | POA: Diagnosis not present

## 2019-07-22 DIAGNOSIS — S61001A Unspecified open wound of right thumb without damage to nail, initial encounter: Secondary | ICD-10-CM | POA: Diagnosis not present

## 2019-07-22 DIAGNOSIS — M1811 Unilateral primary osteoarthritis of first carpometacarpal joint, right hand: Secondary | ICD-10-CM | POA: Diagnosis not present

## 2019-07-22 DIAGNOSIS — Z87891 Personal history of nicotine dependence: Secondary | ICD-10-CM | POA: Insufficient documentation

## 2019-07-22 DIAGNOSIS — M79644 Pain in right finger(s): Secondary | ICD-10-CM | POA: Diagnosis not present

## 2019-07-22 DIAGNOSIS — M24041 Loose body in right finger joint(s): Secondary | ICD-10-CM | POA: Diagnosis not present

## 2019-07-22 MED ORDER — DOXYCYCLINE HYCLATE 100 MG PO CAPS: 100.0000 mg | ORAL_CAPSULE | Freq: Two times a day (BID) | ORAL | 0 refills | Status: DC

## 2019-07-22 MED ORDER — CEFAZOLIN SODIUM 1 G IJ SOLR
1.0000 g | Freq: Once | INTRAMUSCULAR | Status: AC
  Administered 2019-07-22: 1 g via INTRAMUSCULAR
  Filled 2019-07-22: qty 10

## 2019-07-22 MED ORDER — LIDOCAINE HCL 2 % IJ SOLN
10.0000 mL | Freq: Once | INTRAMUSCULAR | Status: DC
  Filled 2019-07-22: qty 20

## 2019-07-22 MED ORDER — STERILE WATER FOR INJECTION IJ SOLN
INTRAMUSCULAR | Status: AC
  Filled 2019-07-22: qty 10

## 2019-07-22 NOTE — ED Notes (Signed)
Called Ortho Tech @[phone] 

## 2019-07-22 NOTE — ED Triage Notes (Signed)
Patient was walking and trip over her dogs at home. Patients right thumb is deformed and bone is sticking out.

## 2019-07-22 NOTE — ED Provider Notes (Signed)
[facility] DEPT Provider Note   CSN: [id number] Arrival date & time: 07/22/19  0105     History Chief Complaint  Patient presents with  . Fall  . Hand Injury    Rita Harding is a 86 y.o. female.  Patient presents to the emergency department for evaluation of right thumb injury.  Patient was at home and tripped over a dog, fell to the ground.  She tried to brace herself with her arm and injured her right thumb.  She reports that the thumb is crooked and the bone is sticking out.  She denies pain, reports that the thumb just feels numb.  No other injury.  Did not hit her head or lose consciousness.        Past Medical History:  Diagnosis Date  . Coronary artery disease 2007   non-critcal coronary single disease  . History of DVT (deep vein thrombosis)    "LLE"; BEEN OFF  COUMADIN SINCE Sept 2012  . Hyperlipidemia   . Hypertension   . MI (myocardial infarction) ([place]) 2007   Thought to be takotsubo cardiomyopathy  . Takotsubo cardiomyopathy 2007   Result with normal EF as OF 2013    Patient Active Problem List   Diagnosis Date Noted  . Coronary artery disease   . Hypertension   . Takotsubo cardiomyopathy   . Hyperlipidemia     Past Surgical History:  Procedure Laterality Date  . AMPUTATION Left 08/21/2018   Procedure: REVISION AMPUTATION LEFT RING FINGER AND INCISION AND DRAINAGE LEFT LONG FINGER;  Surgeon: Leanora Cover, MD;  Location: [facility];  Service: Orthopedics;  Laterality: Left;  . APPENDECTOMY    . CARDIAC CATHETERIZATION  01/03/2006    50-60% LAD with some myocardial  bridging; NON ISCHEMIC  CARDIOMYOPATHY  with EF  30 to 35% no evidence of infarct -- anteroapical and inferapical wall motin abnormalities compatilble with Takotsubo-type syndrome  . DOPPLER LEFT LOWER EXTREMITY ([facility] HX) Left 01/29/2011   left saphenous vein -small amt of chroinc appearing calicification on anterior wall that was  nonobstructing,mildly abnormal   . DOPPLER RIGHT ADDITIONAL EXTR ([facility] HX) Right 01/29/2011  . FIBULA FRACTURE SURGERY Left 2009   UNDER CARE DR Netta Cedars  . FRACTURE SURGERY    . NM MYOVIEW LTD  02/21/2012   low risk  and normal EF 70% or greater  . TONSILLECTOMY    . TRANSTHORACIC ECHOCARDIOGRAM  07/31/2010   no MVP ,NORMAL SYSTOLIC FUNCTION (EF >10%). Mild Ao Sclerosis.       OB History   No obstetric history on file.     Family History  Problem Relation Age of Onset  . Cancer Mother   . Heart attack Father   . Cancer Maternal Grandmother   . Heart disease Paternal Grandmother   . Heart disease Paternal Grandfather     Social History   Tobacco Use  . Smoking status: Former Smoker    Packs/day: 1.00    Years: 15.00    Pack years: 15.00    Types: Cigarettes  . Smokeless tobacco: Never Used  . Tobacco comment: "quit smoking in the 1960s"  Substance Use Topics  . Alcohol use: Yes    Alcohol/week: 25.0 standard drinks    Types: 25 Glasses of wine per week  . Drug use: No    Home Medications Prior to Admission medications   Medication Sig Start Date End Date Taking? Authorizing Provider  aspirin EC 81 MG tablet Take 81  mg by mouth daily.    [provider]  atorvastatin (LIPITOR) 80 MG tablet  08/13/18   [provider]  benazepril (LOTENSIN) 10 MG tablet Take 10 mg by mouth daily.    [provider]  carvedilol (COREG) 12.5 MG tablet Take 12.5 mg by mouth 2 (two) times daily with a meal.    [provider]  Multiple Vitamin (MULTIVITAMIN) tablet Take 1 tablet by mouth daily.    [provider]    Allergies    Codeine and Tetanus toxoids  Review of Systems   Review of Systems  Musculoskeletal: Positive for arthralgias.  Skin: Positive for wound.  All other systems reviewed and are negative.   Physical Exam Updated Vital Signs BP 137/88 (BP Location: Left Arm)   Pulse 86   Temp 98.6 F (37 C) (Oral)   Resp  17   Ht 5\' 2"  (1.575 m)   Wt 62.1 kg   SpO2 92%   BMI 25.06 kg/m   Physical Exam Vitals and nursing note reviewed.  Constitutional:      General: She is not in acute distress.    Appearance: Normal appearance. She is well-developed.  HENT:     Head: Normocephalic and atraumatic.     Right Ear: Hearing normal.     Left Ear: Hearing normal.     Nose: Nose normal.  Eyes:     Conjunctiva/sclera: Conjunctivae normal.     Pupils: Pupils are equal, round, and reactive to light.  Cardiovascular:     Rate and Rhythm: Regular rhythm.     Heart sounds: S1 normal and S2 normal. No murmur. No friction rub. No gallop.   Pulmonary:     Effort: Pulmonary effort is normal. No respiratory distress.     Breath sounds: Normal breath sounds.  Chest:     Chest wall: No tenderness.  Abdominal:     General: Bowel sounds are normal.     Palpations: Abdomen is soft.     Tenderness: There is no abdominal tenderness. There is no guarding or rebound. Negative signs include Murphy's sign and McBurney's sign.     Hernia: No hernia is present.  Musculoskeletal:        General: Normal range of motion.     Cervical back: Normal range of motion and neck supple.  Skin:    General: Skin is warm and dry.     Findings: No rash.  Neurological:     Mental Status: She is alert and oriented to person, place, and time.     GCS: GCS eye subscore is 4. GCS verbal subscore is 5. GCS motor subscore is 6.     Cranial Nerves: No cranial nerve deficit.     Sensory: No sensory deficit.     Coordination: Coordination normal.  Psychiatric:        Speech: Speech normal.        Behavior: Behavior normal.        Thought Content: Thought content normal.         ED Results / Procedures / Treatments   Labs (all labs ordered are listed, but only abnormal results are displayed) Labs Reviewed - No data to display  EKG None  Radiology DG Hand Complete Right  Result Date: 07/22/2019 CLINICAL DATA:  Fall EXAM:  RIGHT HAND - COMPLETE 3+ VIEW COMPARISON:  None. FINDINGS: Dislocation at the right thumb interphalangeal joint with severe medial displacement and angulation. Bulky calcifications adjacent to the right first carpometacarpal  joint. No fracture. IMPRESSION: Right thumb interphalangeal joint dislocation with severe medial displacement and angulation. Electronically Signed   By: Deatra Robinson M.D.   On: 07/22/2019 01:45    Procedures .Ortho Injury Treatment  Date/Time: 07/22/2019 2:18 AM Performed by: Gilda Crease, MD Authorized by: Gilda Crease, MD   Consent:    Consent obtained:  Verbal   Consent given by:  Patient   Risks discussed:  Nerve damage, restricted joint movement, vascular damage, recurrent dislocation and irreducible dislocation Universal protocol:    Procedure explained and questions answered to patient or proxy's satisfaction: yes     Site/side marked: yes     Immediately prior to procedure a time out was called: yes     Patient identity confirmed:  Verbally with patientInjury location: finger Location details: right thumb Injury type: dislocation Dislocation type: IP Pre-procedure distal perfusion: normal Pre-procedure neurological function: diminished Pre-procedure range of motion: reduced Anesthesia: digital block  Anesthesia: Local anesthesia used: yes Local Anesthetic: lidocaine 2% without epinephrine Anesthetic total: 8 mL  Patient sedated: NoManipulation performed: yes Reduction successful: yes Immobilization: splint Splint type: thumb spica Post-procedure distal perfusion: normal Post-procedure neurological function: diminished Post-procedure range of motion: improved Patient tolerance: patient tolerated the procedure well with no immediate complications  .Marland KitchenLaceration Repair  Date/Time: 07/22/2019 2:19 AM Performed by: Gilda Crease, MD Authorized by: Gilda Crease, MD   Consent:    Consent obtained:  Verbal    Consent given by:  Patient   Risks discussed:  Infection and pain Universal protocol:    Procedure explained and questions answered to patient or proxy's satisfaction: yes     Imaging studies available: yes     Site/side marked: yes     Immediately prior to procedure, a time out was called: yes     Patient identity confirmed:  Verbally with patient Anesthesia (see MAR for exact dosages):    Anesthesia method:  Nerve block   Block location:  Thumb   Block needle gauge:  25 G   Block anesthetic:  Lidocaine 2% w/o epi   Block technique:  Digital   Block injection procedure:  Anatomic landmarks identified, introduced needle, incremental injection, anatomic landmarks palpated and negative aspiration for blood   Block outcome:  Anesthesia achieved Laceration details:    Location:  Finger   Finger location:  R thumb   Length (cm):  1.5 Repair type:    Repair type:  Simple Pre-procedure details:    Preparation:  Patient was prepped and draped in usual sterile fashion and imaging obtained to evaluate for foreign bodies Exploration:    Contaminated: no   Treatment:    Area cleansed with:  Betadine   Amount of cleaning:  Extensive   Irrigation solution:  Sterile saline   Irrigation volume:  1000   Irrigation method:  Syringe Skin repair:    Repair method:  Sutures   Suture size:  5-0   Suture material:  Prolene   Suture technique:  Simple interrupted   Number of sutures:  2 Approximation:    Approximation:  Close Post-procedure details:    Dressing:  Non-adherent dressing and splint for protection   Patient tolerance of procedure:  Tolerated well, no immediate complications   (including critical care time)  Medications Ordered in ED Medications  lidocaine (XYLOCAINE) 2 % (with pres) injection 200 mg (has no administration in time range)  ceFAZolin (ANCEF) injection 1 g (has no administration in time range)    ED Course  I have reviewed the triage vital signs and the nursing  notes.  Pertinent labs & imaging results that were available during my care of the patient were reviewed by me and considered in my medical decision making (see chart for details).    MDM Rules/Calculators/A&P                      Patient presents to the emergency department for evaluation of injury to her right thumb after a fall.  Patient has an open dislocation at the IP joint of the right thumb.  This was reduced and extensively irrigated.  There was a large amount of subcutaneous fat protruding from the wound, this was approximated with 2 sutures after the extensive cleaning.  Will cover empirically with antibiotics.  Discussed with Dr. Arita Miss, on-call for hand surgery.  Agrees with current plan, can see patient in office this afternoon for follow-up.  Patient also indicates that she has seen Dr. Merlyn Lot in the past, will give the option of seeing either physician for follow-up.  After I reduced the dislocation, she was soaking the hand in water and slightly bent her thumb and it redislocated.  It appears to be fairly unstable.  I was able to easily reduce it again and patient will be placed in a thumb spica splint to prevent further dislocation.  Final Clinical Impression(s) / ED Diagnoses Final diagnoses:  Thumb dislocation, right, initial encounter    Rx / DC Orders ED Discharge Orders    None       Davan Nawabi, Canary Brim, MD 07/22/19 [phone]

## 2019-07-22 NOTE — Progress Notes (Signed)
Orthopedic Tech Progress Note Patient Details:  [name] Jun 10, 1934 [id number]  Ortho Devices Type of Ortho Device: Ace wrap, Thumb spica splint Splint Material: Fiberglass Ortho Device/Splint Location: RUE Ortho Device/Splint Interventions: Ordered, Application   Post Interventions Patient Tolerated: Well Instructions Provided: Care of device   Ancil Linsey 07/22/2019, 3:39 AM

## 2019-07-22 NOTE — Discharge Instructions (Addendum)
I spoke with Dr. Arita Miss, a hand surgeon tonight.  You will likely need a surgery to fix your thumb. Dr. Arita Miss can see you in the office this afternoon if you call this morning for an appointment.  You can also call Dr. Merlyn Lot, who you have seen before, and see if he can see you in the office for follow-up.

## 2019-07-23 ENCOUNTER — Telehealth: Payer: Self-pay | Admitting: *Deleted

## 2019-07-23 MED ORDER — EZETIMIBE 10 MG PO TABS: 10.0000 mg | ORAL_TABLET | Freq: Every day | ORAL | 3 refills | Status: DC

## 2019-07-23 NOTE — Telephone Encounter (Signed)
The patient has been notified of the result and verbalized understanding.  All questions (if any) were answered. Aware to start zetia 10 mg daily - e-sent topharmacy Tobin Chad, RN 07/23/2019 2:44 PM

## 2019-07-23 NOTE — Telephone Encounter (Signed)
-----   Message from Marykay Lex, MD sent at 07/18/2019  4:46 PM EST ----- Labs from 05/28/19: Na+ 140, K+ 4.5, Cl- 100, HCO3- 24 , BUN 17, Cr 0.8, Glu 108, Ca2+ 9.0; AST 17, ALT 5, AlkP 91 CBC: W 4.7, H/H 12.9/39.5, Plt 156 TC 27, TG 129, HDL 59, LDL 112  Labs not truly at goal - would like to add Zetia 10mg  (LDL still not < 100).  , MD  Rx: Ezetimibe 10 mg PO daily, Disp #90, refill x 3

## 2019-07-27 ENCOUNTER — Telehealth: Payer: Self-pay

## 2019-07-27 DIAGNOSIS — K5900 Constipation, unspecified: Secondary | ICD-10-CM

## 2019-07-27 MED ORDER — POLYETHYLENE GLYCOL 3350 17 GM/SCOOP PO POWD: 17.0000 g | Freq: Two times a day (BID) | ORAL | 1 refills | Status: DC | PRN

## 2019-07-27 NOTE — Telephone Encounter (Signed)
Per PCP, okay to send in Miralax. Pt informed of same.

## 2019-07-27 NOTE — Telephone Encounter (Signed)
New message    The patient is asking for stool softer called in     [facility] on [address]

## 2019-07-28 DIAGNOSIS — S63124D Dislocation of unspecified interphalangeal joint of right thumb, subsequent encounter: Secondary | ICD-10-CM | POA: Diagnosis not present

## 2019-07-28 DIAGNOSIS — S61001A Unspecified open wound of right thumb without damage to nail, initial encounter: Secondary | ICD-10-CM | POA: Diagnosis not present

## 2019-07-28 DIAGNOSIS — S63124A Dislocation of unspecified interphalangeal joint of right thumb, initial encounter: Secondary | ICD-10-CM | POA: Diagnosis not present

## 2019-08-04 DIAGNOSIS — S61001D Unspecified open wound of right thumb without damage to nail, subsequent encounter: Secondary | ICD-10-CM | POA: Diagnosis not present

## 2019-08-04 DIAGNOSIS — Z4802 Encounter for removal of sutures: Secondary | ICD-10-CM | POA: Diagnosis not present

## 2019-08-04 DIAGNOSIS — Z4789 Encounter for other orthopedic aftercare: Secondary | ICD-10-CM | POA: Diagnosis not present

## 2019-08-04 DIAGNOSIS — S63124D Dislocation of unspecified interphalangeal joint of right thumb, subsequent encounter: Secondary | ICD-10-CM | POA: Diagnosis not present

## 2019-08-13 DIAGNOSIS — M19041 Primary osteoarthritis, right hand: Secondary | ICD-10-CM | POA: Diagnosis not present

## 2019-08-13 DIAGNOSIS — S63124A Dislocation of unspecified interphalangeal joint of right thumb, initial encounter: Secondary | ICD-10-CM | POA: Diagnosis not present

## 2019-08-13 DIAGNOSIS — M1811 Unilateral primary osteoarthritis of first carpometacarpal joint, right hand: Secondary | ICD-10-CM | POA: Diagnosis not present

## 2019-08-13 DIAGNOSIS — S61001A Unspecified open wound of right thumb without damage to nail, initial encounter: Secondary | ICD-10-CM | POA: Diagnosis not present

## 2019-08-21 ENCOUNTER — Other Ambulatory Visit: Payer: Self-pay | Admitting: Internal Medicine

## 2019-09-08 DIAGNOSIS — S51852A Open bite of left forearm, initial encounter: Secondary | ICD-10-CM | POA: Diagnosis not present

## 2019-09-08 DIAGNOSIS — W540XXA Bitten by dog, initial encounter: Secondary | ICD-10-CM | POA: Diagnosis not present

## 2019-10-18 ENCOUNTER — Telehealth: Payer: Self-pay

## 2019-10-18 NOTE — Telephone Encounter (Signed)
1.Medication Requested:atorvastatin (LIPITOR) 80 MG tablet  2. Pharmacy (Name, Street, City):[id number] - [place], [place] - [address]  3. On Med List: Yes   4. Last Visit with PCP: 2.8.2021   5. Next visit date with PCP: n/a   Agent: Please be advised that RX refills may take up to 3 business days. We ask that you follow-up with your pharmacy.

## 2019-10-19 MED ORDER — ATORVASTATIN CALCIUM 80 MG PO TABS: 80.0000 mg | ORAL_TABLET | Freq: Every day | ORAL | 1 refills | Status: DC

## 2019-10-19 NOTE — Telephone Encounter (Signed)
erx sent as requested.  

## 2020-01-19 ENCOUNTER — Other Ambulatory Visit: Payer: Self-pay | Admitting: Internal Medicine

## 2020-03-06 ENCOUNTER — Ambulatory Visit: Payer: Medicare Other | Admitting: Cardiology

## 2020-04-10 DIAGNOSIS — Z01419 Encounter for gynecological examination (general) (routine) without abnormal findings: Secondary | ICD-10-CM | POA: Diagnosis not present

## 2020-04-10 DIAGNOSIS — N814 Uterovaginal prolapse, unspecified: Secondary | ICD-10-CM | POA: Diagnosis not present

## 2020-04-10 DIAGNOSIS — Z6824 Body mass index (BMI) 24.0-24.9, adult: Secondary | ICD-10-CM | POA: Diagnosis not present

## 2020-06-30 ENCOUNTER — Ambulatory Visit: Payer: Medicare Other | Admitting: Cardiology

## 2020-09-08 ENCOUNTER — Other Ambulatory Visit: Payer: Self-pay | Admitting: Cardiology

## 2020-09-13 ENCOUNTER — Other Ambulatory Visit: Payer: Self-pay | Admitting: Cardiology

## 2020-09-18 ENCOUNTER — Other Ambulatory Visit: Payer: Self-pay | Admitting: Internal Medicine

## 2020-09-21 ENCOUNTER — Other Ambulatory Visit: Payer: Self-pay | Admitting: Internal Medicine

## 2020-09-25 ENCOUNTER — Other Ambulatory Visit: Payer: Self-pay | Admitting: Internal Medicine

## 2020-10-08 ENCOUNTER — Other Ambulatory Visit: Payer: Self-pay | Admitting: Cardiology

## 2020-10-23 ENCOUNTER — Other Ambulatory Visit: Payer: Self-pay | Admitting: Cardiology

## 2020-11-17 DIAGNOSIS — L299 Pruritus, unspecified: Secondary | ICD-10-CM | POA: Diagnosis not present

## 2020-11-17 DIAGNOSIS — W57XXXA Bitten or stung by nonvenomous insect and other nonvenomous arthropods, initial encounter: Secondary | ICD-10-CM | POA: Diagnosis not present

## 2020-11-17 DIAGNOSIS — L539 Erythematous condition, unspecified: Secondary | ICD-10-CM | POA: Diagnosis not present

## 2020-11-17 DIAGNOSIS — R2241 Localized swelling, mass and lump, right lower limb: Secondary | ICD-10-CM | POA: Diagnosis not present

## 2020-12-23 ENCOUNTER — Other Ambulatory Visit: Payer: Self-pay | Admitting: Cardiology

## 2021-01-09 ENCOUNTER — Other Ambulatory Visit: Payer: Self-pay

## 2021-01-09 ENCOUNTER — Encounter: Payer: Self-pay | Admitting: Internal Medicine

## 2021-01-09 ENCOUNTER — Ambulatory Visit (INDEPENDENT_AMBULATORY_CARE_PROVIDER_SITE_OTHER): Payer: Medicare Other | Admitting: Internal Medicine

## 2021-01-09 VITALS — BP 160/88 | HR 61 | Temp 98.3°F | Resp 16 | Ht 62.0 in | Wt 134.0 lb

## 2021-01-09 DIAGNOSIS — I2510 Atherosclerotic heart disease of native coronary artery without angina pectoris: Secondary | ICD-10-CM

## 2021-01-09 DIAGNOSIS — D649 Anemia, unspecified: Secondary | ICD-10-CM | POA: Diagnosis not present

## 2021-01-09 DIAGNOSIS — R739 Hyperglycemia, unspecified: Secondary | ICD-10-CM

## 2021-01-09 DIAGNOSIS — I10 Essential (primary) hypertension: Secondary | ICD-10-CM

## 2021-01-09 DIAGNOSIS — Z0001 Encounter for general adult medical examination with abnormal findings: Secondary | ICD-10-CM

## 2021-01-09 DIAGNOSIS — E538 Deficiency of other specified B group vitamins: Secondary | ICD-10-CM

## 2021-01-09 DIAGNOSIS — E785 Hyperlipidemia, unspecified: Secondary | ICD-10-CM | POA: Diagnosis not present

## 2021-01-09 LAB — HEPATIC FUNCTION PANEL
ALT: 15 U/L (ref 0–35)
AST: 17 U/L (ref 0–37)
Albumin: 4.3 g/dL (ref 3.5–5.2)
Alkaline Phosphatase: 85 U/L (ref 39–117)
Bilirubin, Direct: 0.1 mg/dL (ref 0.0–0.3)
Total Bilirubin: 0.6 mg/dL (ref 0.2–1.2)
Total Protein: 7.6 g/dL (ref 6.0–8.3)

## 2021-01-09 LAB — LIPID PANEL
Cholesterol: 213 mg/dL — ABNORMAL HIGH (ref 0–200)
HDL: 66.9 mg/dL (ref >39.00)
LDL Cholesterol: 109 mg/dL — ABNORMAL HIGH (ref 0–99)
NonHDL: 146.09
Total CHOL/HDL Ratio: 3
Triglycerides: 183 mg/dL — ABNORMAL HIGH (ref 0.0–149.0)
VLDL: 36.6 mg/dL (ref 0.0–40.0)

## 2021-01-09 LAB — CBC WITH DIFFERENTIAL/PLATELET
Basophils Absolute: 0 10*3/uL (ref 0.0–0.1)
Basophils Relative: 0.9 % (ref 0.0–3.0)
Eosinophils Absolute: 0.1 10*3/uL (ref 0.0–0.7)
Eosinophils Relative: 1.8 % (ref 0.0–5.0)
HCT: 38.6 % (ref 36.0–46.0)
Hemoglobin: 12.8 g/dL (ref 12.0–15.0)
Lymphocytes Relative: 20.9 % (ref 12.0–46.0)
Lymphs Abs: 0.9 10*3/uL (ref 0.7–4.0)
MCHC: 33.2 g/dL (ref 30.0–36.0)
MCV: 95.2 fl (ref 78.0–100.0)
Monocytes Absolute: 0.4 10*3/uL (ref 0.1–1.0)
Monocytes Relative: 8.6 % (ref 3.0–12.0)
Neutro Abs: 3 10*3/uL (ref 1.4–7.7)
Neutrophils Relative %: 67.8 % (ref 43.0–77.0)
Platelets: 159 10*3/uL (ref 150.0–400.0)
RBC: 4.06 Mil/uL (ref 3.87–5.11)
RDW: 13.8 % (ref 11.5–15.5)
WBC: 4.4 10*3/uL (ref 4.0–10.5)

## 2021-01-09 LAB — IRON: Iron: 88 ug/dL (ref 42–145)

## 2021-01-09 LAB — BASIC METABOLIC PANEL
BUN: 18 mg/dL (ref 6–23)
CO2: 30 mEq/L (ref 19–32)
Calcium: 9.9 mg/dL (ref 8.4–10.5)
Chloride: 103 mEq/L (ref 96–112)
Creatinine, Ser: 0.68 mg/dL (ref 0.40–1.20)
GFR: 79.52 mL/min (ref >60.00)
Glucose, Bld: 96 mg/dL (ref 70–99)
Potassium: 4.3 mEq/L (ref 3.5–5.1)
Sodium: 140 mEq/L (ref 135–145)

## 2021-01-09 LAB — FERRITIN: Ferritin: 75.4 ng/mL (ref 10.0–291.0)

## 2021-01-09 LAB — TSH: TSH: 4.25 u[IU]/mL (ref 0.35–5.50)

## 2021-01-09 LAB — VITAMIN B12: Vitamin B-12: 226 pg/mL (ref 211–911)

## 2021-01-09 LAB — HEMOGLOBIN A1C: Hgb A1c MFr Bld: 5.7 % (ref 4.6–6.5)

## 2021-01-09 LAB — FOLATE: Folate: 4.1 ng/mL — ABNORMAL LOW (ref >5.9)

## 2021-01-09 MED ORDER — ATORVASTATIN CALCIUM 80 MG PO TABS: 80.0000 mg | ORAL_TABLET | Freq: Every day | ORAL | 1 refills | Status: DC

## 2021-01-09 MED ORDER — BENAZEPRIL HCL 10 MG PO TABS: 10.0000 mg | ORAL_TABLET | Freq: Every day | ORAL | 1 refills | Status: DC

## 2021-01-09 MED ORDER — FOLIC ACID 1 MG PO TABS: 1.0000 mg | ORAL_TABLET | Freq: Every day | ORAL | 1 refills | Status: DC

## 2021-01-09 MED ORDER — EZETIMIBE 10 MG PO TABS: 10.0000 mg | ORAL_TABLET | Freq: Every day | ORAL | 1 refills | Status: DC

## 2021-01-09 MED ORDER — CARVEDILOL 3.125 MG PO TABS: 3.1250 mg | ORAL_TABLET | Freq: Two times a day (BID) | ORAL | 1 refills | Status: DC

## 2021-01-09 NOTE — Progress Notes (Signed)
Subjective:  Patient ID: Rita Harding, female    DOB: 07-19-1933  Age: 87 y.o. MRN: [id number]  CC: Annual Exam, Hyperlipidemia, Hypertension, and Anemia  This visit occurred during the SARS-CoV-2 public health emergency.  Safety protocols were in place, including screening questions prior to the visit, additional usage of staff PPE, and extensive cleaning of exam room while observing appropriate contact time as indicated for disinfecting solutions.    HPI Rita Harding presents for a CPX and f/up -  She is active and denies any recent episodes of chest pain, shortness of breath, diaphoresis, palpitations, dizziness, or lightheadedness.  Based on prescription refills she would no longer be taking any of the listed medications.  Outpatient Medications Prior to Visit  Medication Sig Dispense Refill   aspirin EC 81 MG tablet Take 81 mg by mouth daily.     Multiple Vitamin (MULTIVITAMIN) tablet Take 1 tablet by mouth daily.     atorvastatin (LIPITOR) 80 MG tablet Take 1 tablet (80 mg total) by mouth daily. 90 tablet 1   benazepril (LOTENSIN) 10 MG tablet TAKE 1 TABLET BY MOUTH ONCE A DAY 90 tablet 3   carvedilol (COREG) 12.5 MG tablet TAKE 1 TABLET BY MOUTH TWICE A DAY 180 tablet 1   doxycycline (VIBRAMYCIN) 100 MG capsule Take 1 capsule (100 mg total) by mouth 2 (two) times daily. 20 capsule 0   ezetimibe (ZETIA) 10 MG tablet TAKE 1 TABLET BY MOUTH EVERY DAY 30 tablet 1   polyethylene glycol powder (GLYCOLAX/MIRALAX) 17 GM/SCOOP powder Take 17 g by mouth 2 (two) times daily as needed. 3350 g 1   No facility-administered medications prior to visit.    ROS Review of Systems  Constitutional:  Negative for diaphoresis, fatigue and fever.  HENT: Negative.  Negative for trouble swallowing.   Eyes: Negative.   Respiratory:  Negative for cough, chest tightness, shortness of breath and wheezing.   Cardiovascular:  Negative for chest pain, palpitations and leg swelling.   Gastrointestinal:  Negative for abdominal pain, constipation, diarrhea, nausea and vomiting.  Endocrine: Negative.  Negative for cold intolerance and heat intolerance.  Genitourinary: Negative.  Negative for difficulty urinating.  Musculoskeletal:  Negative for arthralgias, back pain, myalgias and neck pain.  Skin: Negative.  Negative for color change and pallor.  Neurological:  Negative for dizziness, weakness, light-headedness and headaches.  Hematological:  Negative for adenopathy. Does not bruise/bleed easily.  Psychiatric/Behavioral: Negative.  Negative for hallucinations.    Objective:  BP (!) 160/88 (BP Location: Left Arm, Patient Position: Sitting, Cuff Size: Large)   Pulse 61   Temp 98.3 F (36.8 C) (Oral)   Resp 16   Ht 5\' 2"  (1.575 m)   Wt 134 lb (60.8 kg)   SpO2 96%   BMI 24.51 kg/m   BP Readings from Last 3 Encounters:  01/09/21 (!) 160/88  07/22/19 131/84  07/19/19 132/86    Wt Readings from Last 3 Encounters:  01/09/21 134 lb (60.8 kg)  07/22/19 137 lb (62.1 kg)  07/19/19 137 lb (62.1 kg)    Physical Exam Vitals reviewed.  HENT:     Nose: Nose normal.     Mouth/Throat:     Mouth: Mucous membranes are moist.  Eyes:     Conjunctiva/sclera: Conjunctivae normal.  Cardiovascular:     Rate and Rhythm: Regular rhythm. Bradycardia present.     Heart sounds: Normal heart sounds, S1 normal and S2 normal. No murmur heard.    Comments: EKG- Sinus bradycardia  with PAC LAD - not new LBBB - new No Q waves Pulmonary:     Effort: Pulmonary effort is normal.     Breath sounds: No stridor. No wheezing, rhonchi or rales.  Abdominal:     General: Abdomen is flat. Bowel sounds are normal. There is no distension.     Palpations: Abdomen is soft. There is no hepatomegaly, splenomegaly or mass.  Musculoskeletal:        General: No swelling. Normal range of motion.     Cervical back: Neck supple.     Right lower leg: No edema.     Left lower leg: No edema.  Skin:     General: Skin is warm and dry.     Findings: No rash.  Neurological:     General: No focal deficit present.     Mental Status: She is alert. Mental status is at baseline.  Psychiatric:        Thought Content: Thought content normal.    Lab Results  Component Value Date   WBC 4.4 01/09/2021   HGB 12.8 01/09/2021   HCT 38.6 01/09/2021   PLT 159.0 01/09/2021   GLUCOSE 96 01/09/2021   CHOL 213 (H) 01/09/2021   TRIG 183.0 (H) 01/09/2021   HDL 66.90 01/09/2021   LDLCALC 109 (H) 01/09/2021   ALT 15 01/09/2021   AST 17 01/09/2021   NA 140 01/09/2021   K 4.3 01/09/2021   CL 103 01/09/2021   CREATININE 0.68 01/09/2021   BUN 18 01/09/2021   CO2 30 01/09/2021   TSH 4.25 01/09/2021   INR 1.34 05/27/2015   HGBA1C 5.7 01/09/2021    DG Hand Complete Right  Result Date: 07/22/2019 CLINICAL DATA:  Fall EXAM: RIGHT HAND - COMPLETE 3+ VIEW COMPARISON:  None. FINDINGS: Dislocation at the right thumb interphalangeal joint with severe medial displacement and angulation. Bulky calcifications adjacent to the right first carpometacarpal joint. No fracture. IMPRESSION: Right thumb interphalangeal joint dislocation with severe medial displacement and angulation. Electronically Signed   By: Deatra Robinson M.D.   On: 07/22/2019 01:45    Assessment & Plan:   Rita Harding was seen today for annual exam, hyperlipidemia, hypertension and anemia.  Diagnoses and all orders for this visit:  Coronary artery disease involving native coronary artery of native heart without angina pectoris- She denies any recent episodes of angina but she has a new left bundle branch block.  Will continue risk factor modifications and I have recommended that she follow-up with cardiology. -     benazepril (LOTENSIN) 10 MG tablet; Take 1 tablet (10 mg total) by mouth daily. -     carvedilol (COREG) 3.125 MG tablet; Take 1 tablet (3.125 mg total) by mouth 2 (two) times daily with a meal. -     atorvastatin (LIPITOR) 80 MG tablet;  Take 1 tablet (80 mg total) by mouth daily. -     EKG 12-Lead -     Ambulatory referral to Cardiology  Primary hypertension- Her blood pressure is not adequately well controlled.  Will restart benazepril and carvedilol. -     CBC with Differential/Platelet; Future -     Basic metabolic panel; Future -     TSH; Future -     benazepril (LOTENSIN) 10 MG tablet; Take 1 tablet (10 mg total) by mouth daily. -     carvedilol (COREG) 3.125 MG tablet; Take 1 tablet (3.125 mg total) by mouth 2 (two) times daily with a meal. -     TSH -  Basic metabolic panel -     CBC with Differential/Platelet  Anemia, unspecified type- Her H&H are normal now but she has folate deficiency. -     CBC with Differential/Platelet; Future -     Iron; Future -     Vitamin B12; Future -     Folate; Future -     Ferritin; Future -     Vitamin B1; Future -     Vitamin B1 -     Ferritin -     Folate -     Vitamin B12 -     Iron -     CBC with Differential/Platelet  Hyperglycemia- Her A1c is normal. -     Basic metabolic panel; Future -     Hemoglobin A1c; Future -     Hemoglobin A1c -     Basic metabolic panel  Hyperlipidemia with target LDL less than 100- Will restart meds to try to get her LDL less than 70. -     ezetimibe (ZETIA) 10 MG tablet; Take 1 tablet (10 mg total) by mouth daily. -     Lipid panel; Future -     TSH; Future -     Hepatic function panel; Future -     atorvastatin (LIPITOR) 80 MG tablet; Take 1 tablet (80 mg total) by mouth daily. -     Hepatic function panel -     TSH -     Lipid panel  Dietary folate deficiency -     folic acid (FOLVITE) 1 MG tablet; Take 1 tablet (1 mg total) by mouth daily.  I have discontinued Arloa Koh. Beachem's doxycycline, polyethylene glycol powder, and carvedilol. I have also changed her ezetimibe and benazepril. Additionally, I am having her start on carvedilol and folic acid. Lastly, I am having her maintain her aspirin EC, multivitamin, and  atorvastatin.  Meds ordered this encounter  Medications   ezetimibe (ZETIA) 10 MG tablet    Sig: Take 1 tablet (10 mg total) by mouth daily.    Dispense:  90 tablet    Refill:  1   benazepril (LOTENSIN) 10 MG tablet    Sig: Take 1 tablet (10 mg total) by mouth daily.    Dispense:  90 tablet    Refill:  1   carvedilol (COREG) 3.125 MG tablet    Sig: Take 1 tablet (3.125 mg total) by mouth 2 (two) times daily with a meal.    Dispense:  180 tablet    Refill:  1   atorvastatin (LIPITOR) 80 MG tablet    Sig: Take 1 tablet (80 mg total) by mouth daily.    Dispense:  90 tablet    Refill:  1   folic acid (FOLVITE) 1 MG tablet    Sig: Take 1 tablet (1 mg total) by mouth daily.    Dispense:  90 tablet    Refill:  1     Follow-up: Return in about 3 months (around 04/11/2021).  Sanda Linger, MD

## 2021-01-09 NOTE — Patient Instructions (Signed)
[employer] medicine (25th ed., pp. 1059-1068). [place], [place]: Elsevier.">  Anemia  Anemia is a condition in which there is not enough red blood cells or hemoglobin in the blood. Hemoglobin is a substance in red blood cells thatcarries oxygen. When you do not have enough red blood cells or hemoglobin (are anemic), your body cannot get enough oxygen and your organs may not work properly. Asa result, you may feel very tired or have other problems. What are the causes? Common causes of anemia include: Excessive bleeding. Anemia can be caused by excessive bleeding inside or outside the body, including bleeding from the intestines or from heavy menstrual periods in females. Poor nutrition. Long-lasting (chronic) kidney, thyroid, and liver disease. Bone marrow disorders, spleen problems, and blood disorders. Cancer and treatments for cancer. HIV (human immunodeficiency virus) and AIDS (acquired immunodeficiency syndrome). Infections, medicines, and autoimmune disorders that destroy red blood cells. What are the signs or symptoms? Symptoms of this condition include: Minor weakness. Dizziness. Headache, or difficulties concentrating and sleeping. Heartbeats that feel irregular or faster than normal (palpitations). Shortness of breath, especially with exercise. Pale skin, lips, and nails, or cold hands and feet. Indigestion and nausea. Symptoms may occur suddenly or develop slowly. If your anemia is mild, you maynot have symptoms. How is this diagnosed? This condition is diagnosed based on blood tests, your medical history, and a physical exam. In some cases, a test may be needed in which cells are removed from the soft tissue inside of a bone and looked at under a microscope (bone marrow biopsy). Your health care provider may also check your stool (feces) for blood and may do additional testing to look for the cause of yourbleeding. Other tests may include: Imaging tests, such as a CT scan or  MRI. A procedure to see inside your esophagus and stomach (endoscopy). A procedure to see inside your colon and rectum (colonoscopy). How is this treated? Treatment for this condition depends on the cause. If you continue to lose a lot of blood, you may need to be treated at a hospital. Treatment may include: Taking supplements of iron, vitamin B12, or folic acid. Taking a hormone medicine (erythropoietin) that can help to stimulate red blood cell growth. Having a blood transfusion. This may be needed if you lose a lot of blood. Making changes to your diet. Having surgery to remove your spleen. Follow these instructions at home: Take over-the-counter and prescription medicines only as told by your health care provider. Take supplements only as told by your health care provider. Follow any diet instructions that you were given by your health care provider. Keep all follow-up visits as told by your health care provider. This is important. Contact a health care provider if: You develop new bleeding anywhere in the body. Get help right away if: You are very weak. You are short of breath. You have pain in your abdomen or chest. You are dizzy or feel faint. You have trouble concentrating. You have bloody stools, black stools, or tarry stools. You vomit repeatedly or you vomit up blood. These symptoms may represent a serious problem that is an emergency. Do not wait to see if the symptoms will go away. Get medical help right away. Call your local emergency services (911 in the [place] Do not drive yourself to the hospital. Summary Anemia is a condition in which you do not have enough red blood cells or enough of a substance in your red blood cells that carries oxygen (hemoglobin). Symptoms may occur suddenly   or develop slowly. If your anemia is mild, you may not have symptoms. This condition is diagnosed with blood tests, a medical history, and a physical exam. Other tests may be  needed. Treatment for this condition depends on the cause of the anemia. This information is not intended to replace advice given to you by your health care provider. Make sure you discuss any questions you have with your healthcare provider. Document Revised: 05/04/2019 Document Reviewed: 05/04/2019 Elsevier Patient Education  2022 [employer].

## 2021-01-14 DIAGNOSIS — Z0001 Encounter for general adult medical examination with abnormal findings: Secondary | ICD-10-CM | POA: Insufficient documentation

## 2021-01-14 LAB — VITAMIN B1: Vitamin B1 (Thiamine): 27 nmol/L (ref 8–30)

## 2021-01-14 NOTE — Assessment & Plan Note (Signed)
Exam completed Labs reviewed Vaccines are up-to-date No cancer screenings indicated Patient education was given 

## 2021-03-05 DIAGNOSIS — L03311 Cellulitis of abdominal wall: Secondary | ICD-10-CM | POA: Diagnosis not present

## 2021-04-11 ENCOUNTER — Ambulatory Visit: Payer: Medicare Other | Admitting: Internal Medicine

## 2021-06-06 ENCOUNTER — Ambulatory Visit (INDEPENDENT_AMBULATORY_CARE_PROVIDER_SITE_OTHER): Payer: Medicare Other | Admitting: Internal Medicine

## 2021-06-06 ENCOUNTER — Other Ambulatory Visit: Payer: Self-pay

## 2021-06-06 ENCOUNTER — Encounter: Payer: Self-pay | Admitting: Internal Medicine

## 2021-06-06 VITALS — BP 146/90 | HR 47 | Temp 97.7°F | Ht 62.0 in | Wt 136.0 lb

## 2021-06-06 DIAGNOSIS — I10 Essential (primary) hypertension: Secondary | ICD-10-CM | POA: Diagnosis not present

## 2021-06-06 DIAGNOSIS — R001 Bradycardia, unspecified: Secondary | ICD-10-CM | POA: Diagnosis not present

## 2021-06-06 DIAGNOSIS — Z23 Encounter for immunization: Secondary | ICD-10-CM

## 2021-06-06 MED ORDER — SHINGRIX 50 MCG/0.5ML IM SUSR: 0.5000 mL | Freq: Once | INTRAMUSCULAR | 1 refills | Status: AC

## 2021-06-06 NOTE — Progress Notes (Signed)
Subjective:  Patient ID: Rita Harding, female    DOB: June 08, 1934  Age: 87 y.o. MRN: [id number]  CC: Coronary Artery Disease and Hypertension  This visit occurred during the SARS-CoV-2 public health emergency.  Safety protocols were in place, including screening questions prior to the visit, additional usage of staff PPE, and extensive cleaning of exam room while observing appropriate contact time as indicated for disinfecting solutions.    HPI Rita Harding presents for f/up -  She reports that her BP has been well controlled. She denies HA, BV, CP, DOE, SOB, or diaphoresis.  Outpatient Medications Prior to Visit  Medication Sig Dispense Refill   aspirin EC 81 MG tablet Take 81 mg by mouth daily.     atorvastatin (LIPITOR) 80 MG tablet Take 1 tablet (80 mg total) by mouth daily. 90 tablet 1   benazepril (LOTENSIN) 10 MG tablet Take 1 tablet (10 mg total) by mouth daily. 90 tablet 1   carvedilol (COREG) 3.125 MG tablet Take 1 tablet (3.125 mg total) by mouth 2 (two) times daily with a meal. 180 tablet 1   ezetimibe (ZETIA) 10 MG tablet Take 1 tablet (10 mg total) by mouth daily. 90 tablet 1   folic acid (FOLVITE) 1 MG tablet Take 1 tablet (1 mg total) by mouth daily. 90 tablet 1   Multiple Vitamin (MULTIVITAMIN) tablet Take 1 tablet by mouth daily.     No facility-administered medications prior to visit.    ROS Review of Systems  Constitutional:  Negative for diaphoresis and fatigue.  HENT: Negative.    Eyes: Negative.   Respiratory:  Negative for cough, chest tightness, shortness of breath and wheezing.   Cardiovascular:  Negative for chest pain, palpitations and leg swelling.  Gastrointestinal:  Negative for abdominal pain and diarrhea.  Endocrine: Negative.   Genitourinary: Negative.  Negative for difficulty urinating, dysuria and hematuria.  Musculoskeletal: Negative.   Skin: Negative.   Allergic/Immunologic: Negative.   Neurological: Negative.  Negative for  dizziness, light-headedness and headaches.  Hematological:  Negative for adenopathy. Does not bruise/bleed easily.  Psychiatric/Behavioral: Negative.     Objective:  BP (!) 146/90 (BP Location: Left Arm, Patient Position: Sitting, Cuff Size: Normal)    Pulse (!) 47    Temp 97.7 F (36.5 C) (Oral)    Ht 5\' 2"  (1.575 m)    Wt 136 lb (61.7 kg)    SpO2 96%    BMI 24.87 kg/m   BP Readings from Last 3 Encounters:  06/06/21 (!) 146/90  01/09/21 (!) 160/88  07/22/19 131/84    Wt Readings from Last 3 Encounters:  06/06/21 136 lb (61.7 kg)  01/09/21 134 lb (60.8 kg)  07/22/19 137 lb (62.1 kg)    Physical Exam Vitals reviewed.  HENT:     Nose: Nose normal.     Mouth/Throat:     Mouth: Mucous membranes are moist.  Eyes:     General: No scleral icterus.    Conjunctiva/sclera: Conjunctivae normal.  Cardiovascular:     Rate and Rhythm: Regular rhythm. Bradycardia present.     Heart sounds: No murmur heard.   No gallop.     Comments: EKG- Sinus bradycardia with 1st degree AV block RBBB - new LAFB- old LVH with repol - new Q wave in aVR is old Pulmonary:     Effort: Pulmonary effort is normal.     Breath sounds: No stridor. No wheezing, rhonchi or rales.  Abdominal:     General: Abdomen is  flat.     Palpations: There is no mass.     Tenderness: There is no abdominal tenderness. There is no guarding.     Hernia: No hernia is present.  Musculoskeletal:     Cervical back: Neck supple. No tenderness.     Right lower leg: No edema.     Left lower leg: No edema.  Skin:    General: Skin is warm and dry.  Neurological:     General: No focal deficit present.     Mental Status: She is alert.    Lab Results  Component Value Date   WBC 4.4 01/09/2021   HGB 12.8 01/09/2021   HCT 38.6 01/09/2021   PLT 159.0 01/09/2021   GLUCOSE 96 01/09/2021   CHOL 213 (H) 01/09/2021   TRIG 183.0 (H) 01/09/2021   HDL 66.90 01/09/2021   LDLCALC 109 (H) 01/09/2021   ALT 15 01/09/2021   AST 17  01/09/2021   NA 140 01/09/2021   K 4.3 01/09/2021   CL 103 01/09/2021   CREATININE 0.68 01/09/2021   BUN 18 01/09/2021   CO2 30 01/09/2021   TSH 4.25 01/09/2021   INR 1.34 05/27/2015   HGBA1C 5.7 01/09/2021    DG Hand Complete Right  Result Date: 07/22/2019 CLINICAL DATA:  Fall EXAM: RIGHT HAND - COMPLETE 3+ VIEW COMPARISON:  None. FINDINGS: Dislocation at the right thumb interphalangeal joint with severe medial displacement and angulation. Bulky calcifications adjacent to the right first carpometacarpal joint. No fracture. IMPRESSION: Right thumb interphalangeal joint dislocation with severe medial displacement and angulation. Electronically Signed   By: Deatra Robinson M.D.   On: 07/22/2019 01:45    Assessment & Plan:   Rita Harding was seen today for coronary artery disease and hypertension.  Diagnoses and all orders for this visit:  Bradycardia- She is asymptomatic with this. No intervention needed. -     EKG 12-Lead  Need for shingles vaccine -     Zoster Vaccine Adjuvanted [facility]) injection; Inject 0.5 mLs into the muscle once for 1 dose.  Primary hypertension- Her BP is adequately well controlled. She has LVH on the EKG but no signs of CHF.  Need for vaccination -     Pneumococcal conjugate vaccine 20-valent (Prevnar 20)   I am having Rita Harding start on Shingrix. I am also having her maintain her aspirin EC, multivitamin, ezetimibe, benazepril, carvedilol, atorvastatin, and folic acid.  Meds ordered this encounter  Medications   Zoster Vaccine Adjuvanted [facility]) injection    Sig: Inject 0.5 mLs into the muscle once for 1 dose.    Dispense:  0.5 mL    Refill:  1     Follow-up: Return in about 6 months (around 12/05/2021).  Sanda Linger, MD

## 2021-06-06 NOTE — Patient Instructions (Signed)
Bradycardia, Adult °Bradycardia is a slower-than-normal heartbeat. A normal resting heart rate for an adult ranges from 60 to 100 beats per minute. With bradycardia, the resting heart rate is less than 60 beats per minute. °Bradycardia can prevent enough oxygen from reaching certain areas of your body when you are active. It can be serious if it keeps enough oxygen from reaching your brain and other parts of your body. Bradycardia is not a problem for everyone. For some healthy adults, a slow resting heart rate is normal. °What are the causes? °This condition may be caused by: °A problem with the heart, including: °A problem with the heart's electrical system, such as a heart block. With a heart block, electrical signals between the chambers of the heart are partially or completely blocked, so they are not able to work as they should. °A problem with the heart's natural pacemaker (sinus node). °Heart disease. °A heart attack. °Heart damage. °Lyme disease. °A heart infection. °A heart condition that is present at birth (congenital heart defect). °Certain medicines that treat heart conditions. °Certain conditions, such as hypothyroidism and obstructive sleep apnea. °Problems with the balance of chemicals and other substances, like potassium, in the blood. °Trauma. °Radiation therapy. °What increases the risk? °You are more likely to develop this condition if you: °Are age 65 or older. °Have high blood pressure (hypertension), high cholesterol (hyperlipidemia), or diabetes. °Drink heavily, use tobacco or nicotine products, or use drugs. °What are the signs or symptoms? °Symptoms of this condition include: °Light-headedness. °Feeling faint or fainting. °Fatigue and weakness. °Trouble with activity or exercise. °Shortness of breath. °Chest pain (angina). °Drowsiness. °Confusion. °Dizziness. °How is this diagnosed? °This condition may be diagnosed based on: °Your symptoms. °Your medical history. °A physical exam. °During  the exam, your health care provider will listen to your heartbeat and check your pulse. To confirm the diagnosis, your health care provider may order tests, such as: °Blood tests. °An electrocardiogram (ECG). This test records the heart's electrical activity. The test can show how fast your heart is beating and whether the heartbeat is steady. °A test in which you wear a portable device (event recorder or Holter monitor) to record your heart's electrical activity while you go about your day. °An exercise test. °How is this treated? °Treatment for this condition depends on the cause of the condition and how severe your symptoms are. Treatment may involve: °Treatment of the underlying condition. °Changing your medicines or how much medicine you take. °Having a small, battery-operated device called a pacemaker implanted under the skin. When bradycardia occurs, this device can be used to increase your heart rate and help your heart beat in a regular rhythm. °Follow these instructions at home: °Lifestyle °Manage any health conditions that contribute to bradycardia as told by your health care provider. °Follow a heart-healthy diet. A nutrition specialist (dietitian) can help educate you about healthy food options and changes. °Follow an exercise program that is approved by your health care provider. °Maintain a healthy weight. °Try to reduce or manage your stress, such as with yoga or meditation. If you need help reducing stress, ask your health care provider. °Do not use any products that contain nicotine or tobacco. These products include cigarettes, chewing tobacco, and vaping devices, such as e-cigarettes. If you need help quitting, ask your health care provider. °Do not use illegal drugs. °Alcohol use °If you drink alcohol: °Limit how much you have to: °0-1 drink a day for women who are not pregnant. °0-2 drinks a day   for men. °Know how much alcohol is in a drink. In the [place] one drink equals one 12 oz bottle of  beer (355 mL), one 5 oz glass of wine (148 mL), or one 1½ oz glass of hard liquor (44 mL). °General instructions °Take over-the-counter and prescription medicines only as told by your health care provider. °Keep all follow-up visits. This is important. °How is this prevented? °In some cases, bradycardia may be prevented by: °Treating underlying medical problems. °Stopping behaviors or medicines that can trigger the condition. °Contact a health care provider if: °You feel light-headed or dizzy. °You almost faint. °You feel weak or are easily fatigued during physical activity. °You experience confusion or have memory problems. °Get help right away if: °You faint. °You have chest pains or an irregular heartbeat (palpitations). °You have trouble breathing. °These symptoms may represent a serious problem that is an emergency. Do not wait to see if the symptoms will go away. Get medical help right away. Call your local emergency services (911 in the [place] Do not drive yourself to the hospital. °Summary °Bradycardia is a slower-than-normal heartbeat. With bradycardia, the resting heart rate is less than 60 beats per minute. °Treatment for this condition depends on the cause. °Manage any health conditions that contribute to bradycardia as told by your health care provider. °Do not use any products that contain nicotine or tobacco. These products include cigarettes, chewing tobacco, and vaping devices, such as e-cigarettes. °Keep all follow-up visits. This is important. °This information is not intended to replace advice given to you by your health care provider. Make sure you discuss any questions you have with your health care provider. °Document Revised: 09/17/2020 Document Reviewed: 09/17/2020 °Elsevier Patient Education © 2022 [employer] ° °

## 2021-06-18 ENCOUNTER — Ambulatory Visit: Payer: Medicare Other | Admitting: Cardiology

## 2021-08-29 DIAGNOSIS — M25562 Pain in left knee: Secondary | ICD-10-CM | POA: Diagnosis not present

## 2021-08-29 DIAGNOSIS — M170 Bilateral primary osteoarthritis of knee: Secondary | ICD-10-CM | POA: Diagnosis not present

## 2021-08-29 DIAGNOSIS — M25561 Pain in right knee: Secondary | ICD-10-CM | POA: Diagnosis not present

## 2021-09-12 ENCOUNTER — Encounter: Payer: Self-pay | Admitting: Cardiology

## 2021-09-12 ENCOUNTER — Ambulatory Visit (INDEPENDENT_AMBULATORY_CARE_PROVIDER_SITE_OTHER): Payer: Medicare Other | Admitting: Cardiology

## 2021-09-12 VITALS — BP 140/76 | HR 95 | Ht 62.0 in | Wt 134.4 lb

## 2021-09-12 DIAGNOSIS — I2510 Atherosclerotic heart disease of native coronary artery without angina pectoris: Secondary | ICD-10-CM | POA: Diagnosis not present

## 2021-09-12 DIAGNOSIS — I5181 Takotsubo syndrome: Secondary | ICD-10-CM | POA: Diagnosis not present

## 2021-09-12 DIAGNOSIS — I10 Essential (primary) hypertension: Secondary | ICD-10-CM

## 2021-09-12 DIAGNOSIS — I350 Nonrheumatic aortic (valve) stenosis: Secondary | ICD-10-CM

## 2021-09-12 DIAGNOSIS — E785 Hyperlipidemia, unspecified: Secondary | ICD-10-CM | POA: Diagnosis not present

## 2021-09-12 DIAGNOSIS — R001 Bradycardia, unspecified: Secondary | ICD-10-CM

## 2021-09-12 MED ORDER — BENAZEPRIL HCL 5 MG PO TABS: 2.5000 mg | ORAL_TABLET | Freq: Every day | ORAL | 3 refills | Status: DC

## 2021-09-12 MED ORDER — ROSUVASTATIN CALCIUM 20 MG PO TABS: 20.0000 mg | ORAL_TABLET | Freq: Every day | ORAL | 3 refills | Status: DC

## 2021-09-12 NOTE — Patient Instructions (Addendum)
Medication Instructions:  ?  ? ?Start 20 mg  one tablet daily ?  ? Start Benazepril  2.5 mg  one tablet daily ? ?*If you need a refill on your cardiac medications before your next appointment, please call your pharmacy* ? ? ?Lab Work: ? ?Not needed ? ? ?Testing/Procedures ? ? Will be schedule at [address] ?Your physician has requested that you have an echocardiogram. Echocardiography is a painless test that uses sound waves to create images of your heart. It provides your doctor with information about the size and shape of your heart and how well your heart?s chambers and valves are working. This procedure takes approximately one hour. There are no restrictions for this procedure.  ? ? ?Follow-Up: ?At [facility], you and your health needs are our priority.  As part of our continuing mission to provide you with exceptional heart care, we have created designated Provider Care Teams.  These Care Teams include your primary Cardiologist (physician) and Advanced Practice Providers (APPs -  Physician Assistants and Nurse Practitioners) who all work together to provide you with the care you need, when you need it. ? ?  ? ?Your next appointment:   ?12 month(s) ? ?The format for your next appointment:   ?In Person ? ?Provider:   ?Bryan Lemma, MD  ? ? ?

## 2021-09-12 NOTE — Progress Notes (Signed)
? ? ?Primary Care Provider: Etta Grandchild, MD ?Cardiologist: Bryan Lemma, MD ?Electrophysiologist: None ? ?Clinic Note: ?Chief Complaint  ?Patient presents with  ? Follow-up  ?  2-1/2 years.  Doing well.  But several medications  ? ?=================================== ? ?ASSESSMENT/PLAN  ? ?Problem List Items Addressed This Visit   ? ?  ? Cardiology Problems  ? Essential hypertension (Chronic)  ?  Borderline elevated BP today.  She is only on carvedilol and had previously been on ACE inhibitor.  We will restart low-dose ACE inhibitor since she did have cardiomyopathy in the past.   ? ?Start 2.5 mg benazepril. ?  ?  ? Relevant Medications  ? rosuvastatin (CRESTOR) 20 MG tablet  ? benazepril (LOTENSIN) 5 MG tablet  ? Senile calcific aortic valve sclerosis  ?  Spent a long time since she had an echo done to reassess the valve.  Had sclerosis at the time, however the murmur does seem a little brisker today.  Need to exclude progression to stenosis. ? ?Plan: Check 2D echo.. ?  ?  ? Relevant Medications  ? rosuvastatin (CRESTOR) 20 MG tablet  ? benazepril (LOTENSIN) 5 MG tablet  ? Other Relevant Orders  ? ECHOCARDIOGRAM COMPLETE  ? Coronary artery disease, non-occlusive (Chronic)  ?  Nonocclusive CAD by cath back in the time of her cardiomyopathy evaluation.  Has not had any anginal Spika.  No chest pain or pressure.  In fact her EF notably improved. ? ?Continue aspirin and beta-blocker. ? ?With poorly controlled lipids, will restart statin.-Rosuvastatin 20 mg) ?  ?  ? Relevant Medications  ? rosuvastatin (CRESTOR) 20 MG tablet  ? benazepril (LOTENSIN) 5 MG tablet  ? Takotsubo cardiomyopathy; resolved - Primary (Chronic)  ?  Over 15 years ago.  EF is back to normal with normal checkups.  Has not been reassessed since 2013. ?No CHF symptoms.  She has been only on carvedilol and not the ACE inhibitor.  Since she has blood pressure 140/76, I think we can restart low-dose benazepril.  She had been on 2.5 mg daily we will  refill at that dose (although she apparently had most recently been on 10 mg prior to discontinuing). ?  ?  ? Relevant Medications  ? rosuvastatin (CRESTOR) 20 MG tablet  ? benazepril (LOTENSIN) 5 MG tablet  ? Hyperlipidemia with target LDL less than 100 (Chronic)  ? Relevant Medications  ? rosuvastatin (CRESTOR) 20 MG tablet  ? benazepril (LOTENSIN) 5 MG tablet  ?  ? Other  ? Bradycardia (Chronic)  ?  Heart rate seems better on reduced dose of carvedilol. ?  ?  ? ? ?=================================== ? ?HPI:   ? ?Rita Harding is a 88 y.o. female with a PMH notable for distant history of Takotsubo cardiomyopathy subsequently recovered, moderate LAD disease who presents today for 2-1/2-year follow-up. ? ?Rita Harding was last seen here by me back in September 2020.  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  She had 50 to 60% LAD lesion with some bridging, but nonobstructive.  EF as of September 2013 was back to 70%. ?=> She was doing well from a cardiac standpoint.  No PND, orthopnea or edema.  No resting or exertional dyspnea or chest pain.  Some dizzy spells and lightheadedness but no syncope or near syncope.  Not doing well with maintaining adequate hydration-noting some orthostatic hypotension.. ? ?Recent Hospitalizations: none ? ?Reviewed  CV studies:   ? ?  The following studies were reviewed today: (if available, images/films reviewed: From Epic Chart or Care Everywhere) ?None : ? ? ?Interval History:  ? ?Rita Harding returns today for very delayed follow-up really without any major complaints.  She was doing fine but just the last day or so she has felt little funny.  Just a little unsteady and lightheaded.  Has not really had a lot of the lightheadedness and dizziness.  But interestingly she has not been on her statin or ACE inhibitor, indicated no longer refilled her prescription.  This makes sense that since 2 and half years since  I last seen her. ? ?She is pretty asymptomatic regarding standpoint though with no PND orthopnea edema.  No chest pain or pressure with rest or exertion.  No syncope/near syncope or TIA/reports weakness, claudication.  Just a little lightheaded today. ? ?CV Review of Symptoms (Summary) ?Cardiovascular ROS: no chest pain or dyspnea on exertion ?positive for - -feels a little lightheaded./Woozy today ?negative for - edema, irregular heartbeat, orthopnea, palpitations, paroxysmal nocturnal dyspnea, rapid heart rate, shortness of breath, or syncope/near syncope or TIA/amaurosis fugax, claudication ? ?REVIEWED OF SYSTEMS  ? ?Pertinent positive review of symptoms: ?Intermittent dizziness ?Less active than usual ? ?I have reviewed and (if needed) personally updated the patient's problem list, medications, allergies, past medical and surgical history, social and family history.  ? ?PAST MEDICAL HISTORY  ? ?Past Medical History:  ?Diagnosis Date  ? Coronary artery disease 2007  ? non-critcal coronary single disease  ? History of DVT (deep vein thrombosis)   ? "LLE"; BEEN OFF  COUMADIN SINCE Sept 2012  ? Hyperlipidemia   ? Hypertension   ? MI (myocardial infarction) (HCC) 2007  ? Thought to be takotsubo cardiomyopathy  ? Takotsubo cardiomyopathy 2007  ? Result with normal EF as OF 2013  ? ? ?PAST SURGICAL HISTORY  ? ?Past Surgical History:  ?Procedure Laterality Date  ? AMPUTATION Left 08/21/2018  ? Procedure: REVISION AMPUTATION LEFT RING FINGER AND INCISION AND DRAINAGE LEFT LONG FINGER;  Surgeon: Betha Loa, MD;  Location: [facility];  Service: Orthopedics;  Laterality: Left;  ? APPENDECTOMY    ? CARDIAC CATHETERIZATION  01/03/2006  ?  50-60% LAD with some myocardial  bridging; NON ISCHEMIC  CARDIOMYOPATHY  with EF  30 to 35% no evidence of infarct -- anteroapical and inferapical wall motin abnormalities compatilble with Takotsubo-type syndrome  ? DOPPLER LEFT LOWER EXTREMITY ([facility] HX) Left 01/29/2011  ?  left saphenous vein -small amt of chroinc appearing calicification on anterior wall that was nonobstructing,mildly abnormal   ? DOPPLER RIGHT ADDITIONAL EXTR ([facility] HX) Right 01/29/2011  ? FIBULA FRACTURE SURGERY Left 2009  ? UNDER CARE DR Beverely Low  ? FRACTURE SURGERY    ? NM MYOVIEW LTD  02/21/2012  ? low risk  and normal EF 70% or greater  ? TONSILLECTOMY    ? TRANSTHORACIC ECHOCARDIOGRAM  07/31/2010  ? no MVP ,NORMAL SYSTOLIC FUNCTION (EF >55%). Mild Ao Sclerosis.    ? ? ?Immunization History  ?Administered Date(s) Administered  ? Influenza, High Dose Seasonal PF 03/27/2019  ? Influenza-Unspecified 03/10/2021  ? PNEUMOCOCCAL CONJUGATE-20 06/06/2021  ? Tdap 08/21/2018  ? ? ?MEDICATIONS/ALLERGIES  ? ?Current Meds  ?Medication Sig  ? aspirin EC 81 MG tablet Take 81 mg by mouth daily.  ? benazepril (LOTENSIN) 5 MG tablet Not taking (was 2.5 mg daily)  ? carvedilol (COREG) 3.125 MG tablet Take 1 tablet (3.125 mg total) by mouth 2 (two)  times daily with a meal.  ? ezetimibe (ZETIA) 10 MG tablet Take 1 tablet (10 mg total) by mouth daily.  ? ? ?Allergies  ?Allergen Reactions  ? Codeine Nausea And Vomiting  ? Tetanus Toxoids Other (See Comments)  ?  Swollen and red ( patient informed that she had to take it in three doses)  ? ? ?SOCIAL HISTORY/FAMILY HISTORY  ? ?Reviewed in Epic:  ?Pertinent findings:  ?Social History  ? ?Tobacco Use  ? Smoking status: Former  ?  Packs/day: 1.00  ?  Years: 15.00  ?  Pack years: 15.00  ?  Types: Cigarettes  ? Smokeless tobacco: Never  ? Tobacco comments:  ?  "quit smoking in the 1960s"  ?Vaping Use  ? Vaping Use: Never used  ?Substance Use Topics  ? Alcohol use: Yes  ?  Alcohol/week: 25.0 standard drinks  ?  Types: 25 Glasses of wine per week  ? Drug use: No  ? ?Social History  ? ?Social History Narrative  ? Not on file  ? ? ?OBJCTIVE -PE, EKG, labs  ? ?Wt Readings from Last 3 Encounters:  ?09/12/21 134 lb 6.4 oz (61 kg)  ?06/06/21 136 lb (61.7 kg)  ?01/09/21 134 lb (60.8 kg)   ? ? ?Physical Exam: ?BP 140/76   Pulse 95   Ht 5\' 2"  (1.575 m)   Wt 134 lb 6.4 oz (61 kg)   SpO2 95%   BMI 24.58 kg/m?  ?Physical Exam ?Vitals reviewed.  ?Constitutional:   ?   General: She is not in acute dist

## 2021-09-20 ENCOUNTER — Encounter: Payer: Self-pay | Admitting: Cardiology

## 2021-09-20 NOTE — Assessment & Plan Note (Signed)
Spent a long time since she had an echo done to reassess the valve.  Had sclerosis at the time, however the murmur does seem a little brisker today.  Need to exclude progression to stenosis. ? ?Plan: Check 2D echo.Marland Kitchen ?

## 2021-09-20 NOTE — Assessment & Plan Note (Signed)
Heart rate seems better on reduced dose of carvedilol. ?

## 2021-09-20 NOTE — Assessment & Plan Note (Addendum)
Nonocclusive CAD by cath back in the time of her cardiomyopathy evaluation.  Has not had any anginal Spika.  No chest pain or pressure.  In fact her EF notably improved. ? ?Continue aspirin and beta-blocker. ? ?With poorly controlled lipids, will restart statin.-Rosuvastatin 20 mg) ?

## 2021-09-20 NOTE — Assessment & Plan Note (Signed)
Borderline elevated BP today.  She is only on carvedilol and had previously been on ACE inhibitor.  We will restart low-dose ACE inhibitor since she did have cardiomyopathy in the past.   ? ?Start 2.5 mg benazepril. ?

## 2021-09-20 NOTE — Assessment & Plan Note (Signed)
Over 15 years ago.  EF is back to normal with normal checkups.  Has not been reassessed since 2013. ?No CHF symptoms.  She has been only on carvedilol and not the ACE inhibitor.  Since she has blood pressure 140/76, I think we can restart low-dose benazepril.  She had been on 2.5 mg daily we will refill at that dose (although she apparently had most recently been on 10 mg prior to discontinuing). ?

## 2021-10-01 ENCOUNTER — Ambulatory Visit (HOSPITAL_COMMUNITY): Payer: Medicare Other | Attending: Internal Medicine

## 2021-10-01 DIAGNOSIS — I2510 Atherosclerotic heart disease of native coronary artery without angina pectoris: Secondary | ICD-10-CM | POA: Diagnosis not present

## 2021-10-01 DIAGNOSIS — I350 Nonrheumatic aortic (valve) stenosis: Secondary | ICD-10-CM | POA: Insufficient documentation

## 2021-10-01 LAB — ECHOCARDIOGRAM COMPLETE
Area-P 1/2: 2.87 cm2
P 1/2 time: 976 msec
S' Lateral: 2.3 cm

## 2021-11-08 DIAGNOSIS — Z01419 Encounter for gynecological examination (general) (routine) without abnormal findings: Secondary | ICD-10-CM | POA: Diagnosis not present

## 2021-11-08 DIAGNOSIS — Z6822 Body mass index (BMI) 22.0-22.9, adult: Secondary | ICD-10-CM | POA: Diagnosis not present

## 2021-11-08 DIAGNOSIS — N814 Uterovaginal prolapse, unspecified: Secondary | ICD-10-CM | POA: Diagnosis not present

## 2021-11-09 ENCOUNTER — Other Ambulatory Visit: Payer: Self-pay | Admitting: Internal Medicine

## 2021-11-09 DIAGNOSIS — I2510 Atherosclerotic heart disease of native coronary artery without angina pectoris: Secondary | ICD-10-CM

## 2021-11-09 DIAGNOSIS — I10 Essential (primary) hypertension: Secondary | ICD-10-CM

## 2021-11-12 ENCOUNTER — Inpatient Hospital Stay (HOSPITAL_BASED_OUTPATIENT_CLINIC_OR_DEPARTMENT_OTHER)
Admission: EM | Admit: 2021-11-12 | Discharge: 2021-11-15 | DRG: 243 | Disposition: A | Discharge status: Home / Self Care | Payer: Medicare Other | Attending: Family Medicine | Admitting: Family Medicine

## 2021-11-12 ENCOUNTER — Other Ambulatory Visit: Payer: Self-pay

## 2021-11-12 ENCOUNTER — Encounter (HOSPITAL_BASED_OUTPATIENT_CLINIC_OR_DEPARTMENT_OTHER): Payer: Self-pay | Admitting: Emergency Medicine

## 2021-11-12 ENCOUNTER — Emergency Department (HOSPITAL_BASED_OUTPATIENT_CLINIC_OR_DEPARTMENT_OTHER): Payer: Medicare Other

## 2021-11-12 DIAGNOSIS — R778 Other specified abnormalities of plasma proteins: Secondary | ICD-10-CM

## 2021-11-12 DIAGNOSIS — R7989 Other specified abnormal findings of blood chemistry: Secondary | ICD-10-CM

## 2021-11-12 DIAGNOSIS — R32 Unspecified urinary incontinence: Secondary | ICD-10-CM | POA: Diagnosis present

## 2021-11-12 DIAGNOSIS — I2510 Atherosclerotic heart disease of native coronary artery without angina pectoris: Secondary | ICD-10-CM

## 2021-11-12 DIAGNOSIS — Z7982 Long term (current) use of aspirin: Secondary | ICD-10-CM | POA: Diagnosis not present

## 2021-11-12 DIAGNOSIS — I110 Hypertensive heart disease with heart failure: Secondary | ICD-10-CM | POA: Diagnosis not present

## 2021-11-12 DIAGNOSIS — I517 Cardiomegaly: Secondary | ICD-10-CM | POA: Diagnosis not present

## 2021-11-12 DIAGNOSIS — I459 Conduction disorder, unspecified: Secondary | ICD-10-CM | POA: Diagnosis not present

## 2021-11-12 DIAGNOSIS — I10 Essential (primary) hypertension: Secondary | ICD-10-CM

## 2021-11-12 DIAGNOSIS — K6289 Other specified diseases of anus and rectum: Secondary | ICD-10-CM

## 2021-11-12 DIAGNOSIS — I5181 Takotsubo syndrome: Secondary | ICD-10-CM | POA: Diagnosis present

## 2021-11-12 DIAGNOSIS — N3289 Other specified disorders of bladder: Secondary | ICD-10-CM | POA: Diagnosis not present

## 2021-11-12 DIAGNOSIS — R109 Unspecified abdominal pain: Secondary | ICD-10-CM

## 2021-11-12 DIAGNOSIS — I252 Old myocardial infarction: Secondary | ICD-10-CM | POA: Diagnosis not present

## 2021-11-12 DIAGNOSIS — R197 Diarrhea, unspecified: Secondary | ICD-10-CM

## 2021-11-12 DIAGNOSIS — R001 Bradycardia, unspecified: Secondary | ICD-10-CM | POA: Diagnosis not present

## 2021-11-12 DIAGNOSIS — E785 Hyperlipidemia, unspecified: Secondary | ICD-10-CM | POA: Diagnosis not present

## 2021-11-12 DIAGNOSIS — I428 Other cardiomyopathies: Secondary | ICD-10-CM | POA: Diagnosis not present

## 2021-11-12 DIAGNOSIS — Z79899 Other long term (current) drug therapy: Secondary | ICD-10-CM

## 2021-11-12 DIAGNOSIS — Z809 Family history of malignant neoplasm, unspecified: Secondary | ICD-10-CM | POA: Diagnosis not present

## 2021-11-12 DIAGNOSIS — Z87891 Personal history of nicotine dependence: Secondary | ICD-10-CM | POA: Diagnosis not present

## 2021-11-12 DIAGNOSIS — N3001 Acute cystitis with hematuria: Secondary | ICD-10-CM | POA: Diagnosis not present

## 2021-11-12 DIAGNOSIS — R739 Hyperglycemia, unspecified: Secondary | ICD-10-CM | POA: Diagnosis present

## 2021-11-12 DIAGNOSIS — N179 Acute kidney failure, unspecified: Secondary | ICD-10-CM | POA: Diagnosis not present

## 2021-11-12 DIAGNOSIS — I441 Atrioventricular block, second degree: Principal | ICD-10-CM | POA: Diagnosis present

## 2021-11-12 DIAGNOSIS — R9431 Abnormal electrocardiogram [ECG] [EKG]: Secondary | ICD-10-CM | POA: Diagnosis not present

## 2021-11-12 DIAGNOSIS — K529 Noninfective gastroenteritis and colitis, unspecified: Secondary | ICD-10-CM | POA: Diagnosis not present

## 2021-11-12 DIAGNOSIS — I700 Atherosclerosis of aorta: Secondary | ICD-10-CM | POA: Diagnosis not present

## 2021-11-12 DIAGNOSIS — I5030 Unspecified diastolic (congestive) heart failure: Secondary | ICD-10-CM | POA: Diagnosis present

## 2021-11-12 DIAGNOSIS — Z950 Presence of cardiac pacemaker: Secondary | ICD-10-CM | POA: Diagnosis not present

## 2021-11-12 DIAGNOSIS — E861 Hypovolemia: Secondary | ICD-10-CM | POA: Diagnosis present

## 2021-11-12 DIAGNOSIS — K5120 Ulcerative (chronic) proctitis without complications: Secondary | ICD-10-CM | POA: Diagnosis not present

## 2021-11-12 DIAGNOSIS — Z86718 Personal history of other venous thrombosis and embolism: Secondary | ICD-10-CM | POA: Diagnosis not present

## 2021-11-12 DIAGNOSIS — K5730 Diverticulosis of large intestine without perforation or abscess without bleeding: Secondary | ICD-10-CM | POA: Diagnosis not present

## 2021-11-12 DIAGNOSIS — K6389 Other specified diseases of intestine: Secondary | ICD-10-CM | POA: Diagnosis not present

## 2021-11-12 DIAGNOSIS — Z8249 Family history of ischemic heart disease and other diseases of the circulatory system: Secondary | ICD-10-CM

## 2021-11-12 LAB — COMPREHENSIVE METABOLIC PANEL
ALT: 15 U/L (ref 0–44)
AST: 18 U/L (ref 15–41)
Albumin: 4.5 g/dL (ref 3.5–5.0)
Alkaline Phosphatase: 73 U/L (ref 38–126)
Anion gap: 12 (ref 5–15)
BUN: 19 mg/dL (ref 8–23)
CO2: 26 mmol/L (ref 22–32)
Calcium: 10.9 mg/dL — ABNORMAL HIGH (ref 8.9–10.3)
Chloride: 102 mmol/L (ref 98–111)
Creatinine, Ser: 1.08 mg/dL — ABNORMAL HIGH (ref 0.44–1.00)
GFR, Estimated: 50 mL/min — ABNORMAL LOW (ref >60)
Glucose, Bld: 157 mg/dL — ABNORMAL HIGH (ref 70–99)
Potassium: 4.6 mmol/L (ref 3.5–5.1)
Sodium: 140 mmol/L (ref 135–145)
Total Bilirubin: 0.8 mg/dL (ref 0.3–1.2)
Total Protein: 8.1 g/dL (ref 6.5–8.1)

## 2021-11-12 LAB — URINALYSIS, ROUTINE W REFLEX MICROSCOPIC
Glucose, UA: NEGATIVE mg/dL
Nitrite: NEGATIVE
Protein, ur: >300 mg/dL — AB
Specific Gravity, Urine: 1.023 (ref 1.005–1.030)
pH: 5.5 (ref 5.0–8.0)

## 2021-11-12 LAB — LACTIC ACID, PLASMA
Lactic Acid, Venous: 1.1 mmol/L (ref 0.5–1.9)
Lactic Acid, Venous: 1.1 mmol/L (ref 0.5–1.9)

## 2021-11-12 LAB — CBC
HCT: 43.7 % (ref 36.0–46.0)
Hemoglobin: 14.8 g/dL (ref 12.0–15.0)
MCH: 31.4 pg (ref 26.0–34.0)
MCHC: 33.9 g/dL (ref 30.0–36.0)
MCV: 92.8 fL (ref 80.0–100.0)
Platelets: 263 10*3/uL (ref 150–400)
RBC: 4.71 MIL/uL (ref 3.87–5.11)
RDW: 12.8 % (ref 11.5–15.5)
WBC: 14.7 10*3/uL — ABNORMAL HIGH (ref 4.0–10.5)
nRBC: 0 % (ref 0.0–0.2)

## 2021-11-12 LAB — CBG MONITORING, ED: Glucose-Capillary: 111 mg/dL — ABNORMAL HIGH (ref 70–99)

## 2021-11-12 LAB — BRAIN NATRIURETIC PEPTIDE: B Natriuretic Peptide: 230.3 pg/mL — ABNORMAL HIGH (ref 0.0–100.0)

## 2021-11-12 LAB — TROPONIN I (HIGH SENSITIVITY)
Troponin I (High Sensitivity): 17 ng/L (ref <18)
Troponin I (High Sensitivity): 56 ng/L — ABNORMAL HIGH (ref <18)

## 2021-11-12 LAB — MAGNESIUM: Magnesium: 2 mg/dL (ref 1.7–2.4)

## 2021-11-12 LAB — LIPASE, BLOOD: Lipase: 14 U/L (ref 11–51)

## 2021-11-12 LAB — GLUCOSE, CAPILLARY: Glucose-Capillary: 133 mg/dL — ABNORMAL HIGH (ref 70–99)

## 2021-11-12 MED ORDER — METRONIDAZOLE 500 MG/100ML IV SOLN
500.0000 mg | Freq: Three times a day (TID) | INTRAVENOUS | Status: DC
  Administered 2021-11-13: 500 mg via INTRAVENOUS
  Filled 2021-11-12 (×2): qty 100

## 2021-11-12 MED ORDER — LACTATED RINGERS IV SOLN: INTRAVENOUS | Status: AC

## 2021-11-12 MED ORDER — MELATONIN 5 MG PO TABS
5.0000 mg | ORAL_TABLET | Freq: Every evening | ORAL | Status: DC | PRN
Start: 2021-11-12 — End: 2021-11-15
  Administered 2021-11-14: 5 mg via ORAL
  Filled 2021-11-12: qty 1

## 2021-11-12 MED ORDER — METRONIDAZOLE 500 MG/100ML IV SOLN
500.0000 mg | Freq: Once | INTRAVENOUS | Status: AC
  Administered 2021-11-12: 500 mg via INTRAVENOUS
  Filled 2021-11-12: qty 100

## 2021-11-12 MED ORDER — HYDROMORPHONE HCL 1 MG/ML IJ SOLN: 0.5000 mg | INTRAMUSCULAR | Status: DC | PRN

## 2021-11-12 MED ORDER — SODIUM CHLORIDE 0.9 % IV SOLN: 2.0000 g | INTRAVENOUS | Status: DC

## 2021-11-12 MED ORDER — ROSUVASTATIN CALCIUM 20 MG PO TABS
20.0000 mg | ORAL_TABLET | Freq: Every day | ORAL | Status: DC
  Administered 2021-11-13 – 2021-11-15 (×3): 20 mg via ORAL
  Filled 2021-11-12 (×3): qty 1

## 2021-11-12 MED ORDER — LACTATED RINGERS IV BOLUS
500.0000 mL | Freq: Once | INTRAVENOUS | Status: AC
  Administered 2021-11-12: 500 mL via INTRAVENOUS

## 2021-11-12 MED ORDER — ASPIRIN 81 MG PO TBEC
81.0000 mg | DELAYED_RELEASE_TABLET | Freq: Every day | ORAL | Status: DC
  Administered 2021-11-13 – 2021-11-15 (×3): 81 mg via ORAL
  Filled 2021-11-12 (×3): qty 1

## 2021-11-12 MED ORDER — INSULIN ASPART 100 UNIT/ML IJ SOLN
0.0000 [IU] | Freq: Three times a day (TID) | INTRAMUSCULAR | Status: DC
  Administered 2021-11-13: 1 [IU] via SUBCUTANEOUS

## 2021-11-12 MED ORDER — OXYCODONE HCL 5 MG PO TABS: 5.0000 mg | ORAL_TABLET | Freq: Four times a day (QID) | ORAL | Status: DC | PRN

## 2021-11-12 MED ORDER — ONDANSETRON HCL 4 MG/2ML IJ SOLN: 4.0000 mg | Freq: Four times a day (QID) | INTRAMUSCULAR | Status: DC | PRN

## 2021-11-12 MED ORDER — POLYETHYLENE GLYCOL 3350 17 G PO PACK: 17.0000 g | PACK | Freq: Every day | ORAL | Status: DC | PRN

## 2021-11-12 MED ORDER — EZETIMIBE 10 MG PO TABS
10.0000 mg | ORAL_TABLET | Freq: Every day | ORAL | Status: DC
  Administered 2021-11-13 – 2021-11-15 (×3): 10 mg via ORAL
  Filled 2021-11-12 (×3): qty 1

## 2021-11-12 MED ORDER — INSULIN ASPART 100 UNIT/ML IJ SOLN: 0.0000 [IU] | Freq: Every day | INTRAMUSCULAR | Status: DC

## 2021-11-12 MED ORDER — FOLIC ACID 1 MG PO TABS
1.0000 mg | ORAL_TABLET | Freq: Every day | ORAL | Status: DC
  Administered 2021-11-13 – 2021-11-15 (×3): 1 mg via ORAL
  Filled 2021-11-12 (×3): qty 1

## 2021-11-12 MED ORDER — IOHEXOL 300 MG/ML  SOLN
100.0000 mL | Freq: Once | INTRAMUSCULAR | Status: AC | PRN
  Administered 2021-11-12: 75 mL via INTRAVENOUS

## 2021-11-12 MED ORDER — SODIUM CHLORIDE 0.9 % IV SOLN
2.0000 g | Freq: Once | INTRAVENOUS | Status: AC
  Administered 2021-11-12: 2 g via INTRAVENOUS
  Filled 2021-11-12: qty 20

## 2021-11-12 MED ORDER — ACETAMINOPHEN 325 MG PO TABS: 650.0000 mg | ORAL_TABLET | Freq: Four times a day (QID) | ORAL | Status: DC | PRN

## 2021-11-12 MED ORDER — ENOXAPARIN SODIUM 30 MG/0.3ML IJ SOSY
30.0000 mg | PREFILLED_SYRINGE | INTRAMUSCULAR | Status: DC
  Administered 2021-11-12 – 2021-11-13 (×2): 30 mg via SUBCUTANEOUS
  Filled 2021-11-12 (×2): qty 0.3

## 2021-11-12 NOTE — ED Notes (Signed)
1st attempt to call report -- nurse not available- was advised by 6E that receiving nurse Dell Ponto will call this nurse back

## 2021-11-12 NOTE — ED Notes (Signed)
Pt awake and alert; GCS 15.  RR even and unlabored on RA with symmetrical rise and fall of chest.  Cardiac and pulse ox monitoring maintained-- SB HR 40 on monitor- pt asymptomatic.  Arrived with c/o abdominal pain with associated n/v/d however denies pain and nausea at this time.  2gm Rocephin now infusing @200ml /hr to 20G L AC; dressing dry and intact- site free of complications.  Will continue to monitor for acute changes and maintain plan of care.

## 2021-11-12 NOTE — ED Notes (Signed)
Report to Angelia with [facility]

## 2021-11-12 NOTE — Progress Notes (Addendum)
Plan of Care Note for accepted transfer   Patient: Rita Harding MRN: [id number]   DOA: 11/12/2021  Facility requesting transfer: [facility] Requesting Provider: Dr. Karene Fry Reason for transfer: colitis and bradycardia Facility course: 88 yo female with hx of CAD, Takotsubo, sinus bradycardia, hypertension, hyperlipidemia who presents with watery diarrhea and abdominal cramping.  CT abdomen/pelvis was revealing for sigmoiditis and proctitis as well as possible UTI.  ED physician is awaiting lactic acid to assess for ischemic colitis and need for CTA of the abdomen.Marland Kitchen  Has been started on IV Rocephin and Flagyl.  She was noted to be bradycardic into the 30s.  EKG showing possible 2-1 AV block.  No significant electrolyte abnormalities.  This was discussed with cardiology Dr. Dione Housekeeper who recommends observation and holding Coreg.  She has had issues with bradycardia recently and has been getting her Coreg adjusted outpatient with cardiology.  Addendum: notified by RN that her troponin has trended upward from 17-56 but remains asymptomatic from cardiac standpoint.   Plan of care: The patient is accepted for admission to Progressive unit, at [facility]..  ED physician to continue care of patient while she remains in the ED  Author: Anselm Jungling, DO 11/12/2021  Check [email or web address] for on-call coverage.  Nursing staff, Please call [facility] Admits & Consults System-Wide number on Amion as soon as patient's arrival, so appropriate admitting provider can evaluate the pt.

## 2021-11-12 NOTE — ED Notes (Signed)
Patient transported to CT 

## 2021-11-12 NOTE — ED Triage Notes (Signed)
Pt endorses diarrhea that started this morning. States she has not been able to control it and has been incontinent.

## 2021-11-12 NOTE — ED Provider Notes (Signed)
  Physical Exam  BP 107/75   Pulse (!) 38   Temp 97.7 F (36.5 C) (Oral)   Resp (!) 21   Ht 5\' 2"  (1.575 m)   Wt 56.2 kg   SpO2 94%   BMI 22.68 kg/m     Procedures  Procedures  ED Course / MDM   Clinical Course as of 11/12/21 1841  Mon Nov 12, 2021  1749 WBC(!): 14.7 [JL]  1832 WBC(!): 14.7 [JL]    Clinical Course User Index [JL] Nov 14, 2021, MD   Medical Decision Making Amount and/or Complexity of Data Reviewed Labs: ordered. Decision-making details documented in ED Course. Radiology: ordered.  Risk Prescription drug management. Decision regarding hospitalization.   26F, presenting with diarrhea, chills, fatigue, abdominal cramping, intermittently presenting with bradycardia and what appears to be 2nd degree heart block. Waiting on CT abdomen and labs. Will need admission and cardiology consult.  Laboratory evaluation concerning for hematuria, proteinuria, large leukocytes, negative nitrites, 21-50 WBCs, rare bacteria concerning for developing UTI in the setting of a diarrheal illness.  A BNP was mildly and nonspecifically elevated to 230, magnesium was normal, initial troponin was normal and a repeat troponin was elevated to 56.    A CMP revealed mild hypercalcemia to 10.9, otherwise unremarkable, lipase normal, CBC with a leukocytosis to 14.7.  A CT abdomen pelvis was performed which revealed the following:   IMPRESSION:  1. Sigmoiditis and proctitis. Differential diagnosis for etiology  includes infection, inflammation, ischemia.  2. Scattered colonic diverticulosis with no acute diverticulitis.  3. Urinary bladder wall thickening likely due to decompression.  Consider correlation with urinalysis to exclude underlying  infection.  4. Trace simple free fluid within the pelvis.  5.  Aortic Atherosclerosis (ICD10-I70.0).      Due to these findings: A lactic acid was ordered and pending.  I also ordered Rocephin and Flagyl due to concern for intra-abdominal  infection/sigmoiditis.  Spoke with on call cards Dr. Ernie Avena, patient in 2:1 AV block, advised monitoring, stopping her Coreg. Hospitalist medicine consulted for admission. Dr. Dione Housekeeper accepted the patient in admission.     Margo Aye, MD 11/12/21 2214

## 2021-11-12 NOTE — ED Notes (Signed)
Granddaughter at bedside. She questioned the need for IV fluids. Provider informed of patient family concerns.

## 2021-11-12 NOTE — ED Provider Notes (Signed)
[facility] EMERGENCY DEPT Provider Note   CSN: [id number] Arrival date & time: 11/12/21  1330     History  Chief Complaint  Patient presents with   Diarrhea    Rita Harding is a 88 y.o. female.  HPI  88 year old female with past medical history of HTN, HLD, CAD status post MI presents to the emergency department with diarrhea.  Patient states this started last night, she describes it as profuse, multiple episodes, watery.  Sometimes resulting in incontinence.  She has generalized abdominal discomfort with chills but denies any fever.  She is not experiencing any chest pain, shortness of breath, genitourinary symptoms.  Home Medications Prior to Admission medications   Medication Sig Start Date End Date Taking? Authorizing Provider  aspirin EC 81 MG tablet Take 81 mg by mouth daily.    [provider]  benazepril (LOTENSIN) 5 MG tablet Take 0.5 tablets (2.5 mg total) by mouth daily. 09/12/21   Marykay Lex, MD  carvedilol (COREG) 3.125 MG tablet TAKE 1 TABLET BY MOUTH TWICE DAILY WITH A MEAL 11/09/21   Etta Grandchild, MD  ezetimibe (ZETIA) 10 MG tablet Take 1 tablet (10 mg total) by mouth daily. 01/09/21   Etta Grandchild, MD  folic acid (FOLVITE) 1 MG tablet Take 1 tablet (1 mg total) by mouth daily. Patient not taking: Reported on 09/12/2021 01/09/21   Etta Grandchild, MD  Multiple Vitamin (MULTIVITAMIN) tablet Take 1 tablet by mouth daily. Patient not taking: Reported on 09/12/2021    [provider]  rosuvastatin (CRESTOR) 20 MG tablet Take 1 tablet (20 mg total) by mouth daily. 09/12/21 12/11/21  Marykay Lex, MD      Allergies    Codeine and Tetanus toxoids    Review of Systems   Review of Systems  Constitutional:  Positive for chills and fatigue. Negative for fever.  Respiratory:  Negative for chest tightness and shortness of breath.   Cardiovascular:  Negative for chest pain, palpitations and leg swelling.  Gastrointestinal:  Positive  for abdominal pain and diarrhea. Negative for vomiting.  Genitourinary:  Negative for dysuria.  Skin:  Negative for rash.  Neurological:  Negative for headaches.   Physical Exam Updated Vital Signs BP (!) 137/48   Pulse (!) 35   Temp 97.7 F (36.5 C) (Oral)   Resp 15   Ht 5\' 2"  (1.575 m)   Wt 56.2 kg   SpO2 98%   BMI 22.68 kg/m  Physical Exam Vitals and nursing note reviewed.  Constitutional:      General: She is not in acute distress.    Appearance: Normal appearance. She is not ill-appearing or diaphoretic.  HENT:     Head: Normocephalic.     Mouth/Throat:     Mouth: Mucous membranes are moist.  Cardiovascular:     Rate and Rhythm: Bradycardia present. Rhythm irregular.  Pulmonary:     Effort: Pulmonary effort is normal. No respiratory distress.  Abdominal:     Palpations: Abdomen is soft.     Tenderness: There is no abdominal tenderness. There is no guarding or rebound.  Musculoskeletal:        General: No swelling.  Skin:    General: Skin is warm.  Neurological:     Mental Status: She is alert and oriented to person, place, and time. Mental status is at baseline.  Psychiatric:        Mood and Affect: Mood normal.    ED Results / Procedures /  Treatments   Labs (all labs ordered are listed, but only abnormal results are displayed) Labs Reviewed  COMPREHENSIVE METABOLIC PANEL - Abnormal; Notable for the following components:      Result Value   Glucose, Bld 157 (*)    Creatinine, Ser 1.08 (*)    Calcium 10.9 (*)    GFR, Estimated 50 (*)    All other components within normal limits  CBC - Abnormal; Notable for the following components:   WBC 14.7 (*)    All other components within normal limits  LIPASE, BLOOD  URINALYSIS, ROUTINE W REFLEX MICROSCOPIC  MAGNESIUM  BRAIN NATRIURETIC PEPTIDE  TROPONIN I (HIGH SENSITIVITY)    EKG EKG Interpretation  Date/Time:  Monday November 12 2021 14:31:46 EDT Ventricular Rate:  42 PR Interval:  189 QRS  Duration: 124 QT Interval:  497 QTC Calculation: 416 R Axis:   -6 Text Interpretation: Sinus bradycardia Ventricular premature complex LVH with IVCD and secondary repol abnrm ST depr, consider ischemia, inferior leads Anterior ST elevation, probably due to LVH Confirmed by Coralee Pesa [phone]) on 11/12/2021 3:02:39 PM  Radiology No results found.  Procedures Procedures    Medications Ordered in ED Medications - No data to display  ED Course/ Medical Decision Making/ A&P                           Medical Decision Making Amount and/or Complexity of Data Reviewed Labs: ordered.   88 year old female presents the emergency department with diarrhea, generalized abdominal discomfort.  On arrival EKG appears sinus with frequent PACs in a bigeminy pattern.  Upon entering the room patient appears sinus bradycardia with rates in the 40s.  Stable blood pressure, no active chest pain or palpitations.  She is well-appearing on initial physical exam.  Abdominal exam is benign.  Initial blood work shows a mild leukocytosis of 14.7, abdominal labs are reassuring with a normal lipase.  In light of the rhythm change troponin, BNP and magnesium added on.  We are pending urinalysis and CT of the abdomen pelvis and reevaluation. Patient signed out to Dr. Hart Rochester.        Final Clinical Impression(s) / ED Diagnoses Final diagnoses:  None    Rx / DC Orders ED Discharge Orders     None         Rozelle Logan, DO 11/12/21 1551

## 2021-11-12 NOTE — H&P (Addendum)
History and Physical  Rita Harding KZS:[id number] DOB: 14-Sep-1933 DOA: 11/12/2021  Referring physician: Accepted by Dr. Stephenie Acres, hospitalist service. PCP: Etta Grandchild, MD  Outpatient Specialists: Cardiology. Patient coming from: Home through droppage ED  Chief Complaint: Diarrhea and abdominal cramping  HPI: Rita Harding is a 88 y.o. female with medical history significant for coronary artery disease, takotsubo cardiomyopathy, nonischemic cardiomyopathy, sinus bradycardia on Coreg 3.125 mg twice daily, hypertension, hyperlipidemia, history of left lower extremity DVT, off Coumadin since 2012, who initially presented to [facility] ED with complaints of watery diarrhea associated with diffuse abdominal cramping of 1 day duration.  Endorses feeling weaker for the past 2 weeks.  Intermittent lightheadedness.  No syncope.  No palpitations.  No chest pain.    Has not taken her home Coreg and Benazepril in the past 2-3 days.  Saw her cardiologist in September 12, 2021, no changes in her medications, continued lowest dose of Coreg 3.125 mg twice daily as it appeared well-tolerated at that time.  Upon presentation to the ED, work-up revealed sigmoiditis and proctitis seen on contrast CT abdomen/pelvis.  Additionally her UA was positive for pyuria.  She was started on Rocephin and IV Flagyl empirically in the ED.  While in the ED, the patient was noted to be bradycardic with heart rates into the 30s.  A twelve-lead EKG was done which showed possible 2-1 AV block.  The case was discussed with cardiology Dr. Anne Fu who recommended holding off home Coreg and admitting for observation at [facility].    The patient was accepted and admitted by Dr. Cyndia Bent, [facility], as a direct transfer to [facility] progressive unit, as observation status.  At the time of this visit, the patient has no new complaints.  No nausea.  No recurrent episodes of diarrhea.  No anginal symptoms.  Her son is at bedside.  ED Course: Tmax  98.3.  BP 124/87, pulse 37, respiration rate 11, O2 saturation 97% on room air.  Lab studies remarkable for WBC 14.7K.  Serum glucose 157, BUN 19, creatinine 1.08 with GFR 50 from baseline of 0.68.  BNP 230.  Troponin 17, 56.  Review of Systems: Review of systems as noted in the HPI. All other systems reviewed and are negative.   Past Medical History:  Diagnosis Date   Coronary artery disease 2007   non-critcal coronary single disease   History of DVT (deep vein thrombosis)    "LLE"; BEEN OFF  COUMADIN SINCE Sept 2012   Hyperlipidemia    Hypertension    MI (myocardial infarction) (HCC) 2007   Thought to be takotsubo cardiomyopathy   Takotsubo cardiomyopathy 2007   Result with normal EF as OF 2013   Past Surgical History:  Procedure Laterality Date   AMPUTATION Left 08/21/2018   Procedure: REVISION AMPUTATION LEFT RING FINGER AND INCISION AND DRAINAGE LEFT LONG FINGER;  Surgeon: Betha Loa, MD;  Location: [facility];  Service: Orthopedics;  Laterality: Left;   APPENDECTOMY     CARDIAC CATHETERIZATION  01/03/2006    50-60% LAD with some myocardial  bridging; NON ISCHEMIC  CARDIOMYOPATHY  with EF  30 to 35% no evidence of infarct -- anteroapical and inferapical wall motin abnormalities compatilble with Takotsubo-type syndrome   DOPPLER LEFT LOWER EXTREMITY ([facility] HX) Left 01/29/2011   left saphenous vein -small amt of chroinc appearing calicification on anterior wall that was nonobstructing,mildly abnormal    DOPPLER RIGHT ADDITIONAL EXTR ([facility] HX) Right 01/29/2011   FIBULA FRACTURE SURGERY  Left 2009   UNDER CARE DR Beverely Low   FRACTURE SURGERY     NM MYOVIEW LTD  02/21/2012   low risk  and normal EF 70% or greater   TONSILLECTOMY     TRANSTHORACIC ECHOCARDIOGRAM  07/31/2010   no MVP ,NORMAL SYSTOLIC FUNCTION (EF >55%). Mild Ao Sclerosis.      Social History:  reports that she has quit smoking. Her smoking use included cigarettes. She has a 15.00 pack-year smoking  history. She has never used smokeless tobacco. She reports current alcohol use of about 25.0 standard drinks per week. She reports that she does not use drugs.   Allergies  Allergen Reactions   Codeine Nausea And Vomiting   Tetanus Toxoids Other (See Comments)    Swollen and red ( patient informed that she had to take it in three doses)    Family History  Problem Relation Age of Onset   Cancer Mother    Heart attack Father    Cancer Maternal Grandmother    Heart disease Paternal Grandmother    Heart disease Paternal Grandfather       Prior to Admission medications   Medication Sig Start Date End Date Taking? Authorizing Provider  aspirin EC 81 MG tablet Take 81 mg by mouth daily.    [provider]  benazepril (LOTENSIN) 5 MG tablet Take 0.5 tablets (2.5 mg total) by mouth daily. 09/12/21   Marykay Lex, MD  carvedilol (COREG) 3.125 MG tablet TAKE 1 TABLET BY MOUTH TWICE DAILY WITH A MEAL 11/09/21   Etta Grandchild, MD  ezetimibe (ZETIA) 10 MG tablet Take 1 tablet (10 mg total) by mouth daily. 01/09/21   Etta Grandchild, MD  folic acid (FOLVITE) 1 MG tablet Take 1 tablet (1 mg total) by mouth daily. Patient not taking: Reported on 09/12/2021 01/09/21   Etta Grandchild, MD  Multiple Vitamin (MULTIVITAMIN) tablet Take 1 tablet by mouth daily. Patient not taking: Reported on 09/12/2021    [provider]  rosuvastatin (CRESTOR) 20 MG tablet Take 1 tablet (20 mg total) by mouth daily. 09/12/21 12/11/21  Marykay Lex, MD    Physical Exam: BP 124/87 (BP Location: Right Arm)   Pulse (!) 41   Temp 98.2 F (36.8 C) (Oral)   Resp 18   Ht 5\' 2"  (1.575 m)   Wt 56.3 kg   SpO2 96%   BMI 22.72 kg/m   General: 88 y.o. year-old female well developed well nourished in no acute distress.  Alert and oriented x3. Cardiovascular: Regular rate and rhythm with no rubs or gallops.  No thyromegaly or JVD noted.  No lower extremity edema. 2/4 pulses in all 4 extremities. Respiratory:  Clear to auscultation with no wheezes or rales. Good inspiratory effort. Abdomen: Soft left lower quadrant abdominal tenderness with palpation.  Nondistended with normal bowel sounds x4 quadrants. Muskuloskeletal: No cyanosis, clubbing or edema noted bilaterally Neuro: CN II-XII intact, strength, sensation, reflexes Skin: No ulcerative lesions noted or rashes Psychiatry: Judgement and insight appear normal. Mood is appropriate for condition and setting          Labs on Admission:  Basic Metabolic Panel: Recent Labs  Lab 11/12/21 1404  NA 140  K 4.6  CL 102  CO2 26  GLUCOSE 157*  BUN 19  CREATININE 1.08*  CALCIUM 10.9*  MG 2.0   Liver Function Tests: Recent Labs  Lab 11/12/21 1404  AST 18  ALT 15  ALKPHOS 73  BILITOT 0.8  PROT 8.1  ALBUMIN 4.5   Recent Labs  Lab 11/12/21 1404  LIPASE 14   No results for input(s): AMMONIA in the last 168 hours. CBC: Recent Labs  Lab 11/12/21 1404  WBC 14.7*  HGB 14.8  HCT 43.7  MCV 92.8  PLT 263   Cardiac Enzymes: No results for input(s): CKTOTAL, CKMB, CKMBINDEX, TROPONINI in the last 168 hours.  BNP (last 3 results) Recent Labs    11/12/21 1404  BNP 230.3*    ProBNP (last 3 results) No results for input(s): PROBNP in the last 8760 hours.  CBG: Recent Labs  Lab 11/12/21 2029  GLUCAP 111*    Radiological Exams on Admission: CT Abdomen Pelvis W Contrast  Result Date: 11/12/2021 CLINICAL DATA:  Abdominal pain, acute, nonlocalized diarrhea EXAM: CT ABDOMEN AND PELVIS WITH CONTRAST TECHNIQUE: Multidetector CT imaging of the abdomen and pelvis was performed using the standard protocol following bolus administration of intravenous contrast. RADIATION DOSE REDUCTION: This exam was performed according to the departmental dose-optimization program which includes automated exposure control, adjustment of the mA and/or kV according to patient size and/or use of iterative reconstruction technique. CONTRAST:  26mL OMNIPAQUE  IOHEXOL 300 MG/ML  SOLN COMPARISON:  None Available. FINDINGS: Lower chest: No acute abnormality.  Tiny hiatal hernia. Hepatobiliary: No focal liver abnormality. No gallstones, gallbladder wall thickening, or pericholecystic fluid. No biliary dilatation. Pancreas: No focal lesion. Normal pancreatic contour. No surrounding inflammatory changes. No main pancreatic ductal dilatation. Spleen: Normal in size without focal abnormality. Adrenals/Urinary Tract: No adrenal nodule bilaterally. Bilateral kidneys enhance symmetrically. No hydronephrosis. No hydroureter. Urinary bladder wall thickening likely due to decompression. Otherwise unremarkable. Stomach/Bowel: Stomach is within normal limits. No evidence of small bowel wall thickening or dilatation. Diffuse bowel thickening and mucosal hyperemia of the sigmoid colon and rectum. Scattered colonic diverticulosis. Appendix appears normal. Vascular/Lymphatic: No abdominal aorta or iliac aneurysm. Atherosclerotic plaque of the aorta and its branches. No abdominal, pelvic, or inguinal lymphadenopathy. Reproductive: Concern device in grossly appropriate position. Uterus and bilateral adnexa are unremarkable. Other: Trace volume free fluid within the pelvis. No intraperitoneal free gas. No organized fluid collection. Musculoskeletal: No abdominal wall hernia or abnormality. No suspicious lytic or blastic osseous lesions. No acute displaced fracture. Levoscoliosis centered at the L3 level with associated severe multilevel degenerative changes of the spine. IMPRESSION: 1. Sigmoiditis and proctitis. Differential diagnosis for etiology includes infection, inflammation, ischemia. 2. Scattered colonic diverticulosis with no acute diverticulitis. 3. Urinary bladder wall thickening likely due to decompression. Consider correlation with urinalysis to exclude underlying infection. 4. Trace simple free fluid within the pelvis. 5.  Aortic Atherosclerosis (ICD10-I70.0). Electronically  Signed   By: Tish Frederickson M.D.   On: 11/12/2021 17:53    EKG: I independently viewed the EKG done and my findings are as followed: Sinus bradycardia rate of 42, PVCs.  QTc 416.  Assessment/Plan Present on Admission:  Sinus bradycardia  Principal Problem:   Sinus bradycardia  Sinus bradycardia Continue to hold off home Coreg to avoid worsening bradycardia Endorses she stopped taking Coreg and Benazepril for the past 2-3 days. Cardiology Fellow, Dr. Hyacinth Meeker, consulted tonight due to persistent bradycardia in the 30's. Closely monitor on telemetry unit. Obtain TSH N.p.o. after midnight until seen by cardiology Rest of management per cardiology.  Sigmoiditis/proctitis seen on contrast CT scan Presented with diarrhea, diffuse abdominal cramps, left lower quadrant abdominal tenderness with palpation, WBC 14.7 thousand Started empiric IV antibiotics Rocephin 2 g daily and IV Flagyl 500 mg 3 times  daily in the ED, continue. Monitor fever curve and WBC Analgesics as needed.  Presumed UTI, POA UA positive for pyuria Continue IV antibiotics started in the ED with Rocephin and IV Flagyl.  Rocephin will provide coverage for UTI. Monitor fever curve and WBC Follow urine culture  AKI, suspect prerenal in the setting of diarrhea Baseline creatinine appears to be 0.68 with GFR greater than 60. Presented with creatinine of 1.08 with GFR 50 Avoid nephrotoxic agents, dehydration and hypotension Started LR at 50 cc/h x 2 days. Monitor urine output with strict I's and O's.  Hyperlipidemia Resume home Zetia and home Crestor.  Essential hypertension BP is currently at goal Continue to hold off home Coreg and home benazepril  Hyperglycemia, no known history of diabetes Obtain hemoglobin A1c Start insulin sliding scale  HFpEF 60 to 65% Euvolemic on exam Start strict I's and O's and daily weight Closely monitor volume status while on IV fluid hydration   Critical care time: 65  minutes.    DVT prophylaxis: Subcu Lovenox daily.  Code Status: Full code  Family Communication: Her son is at bedside.  All questions answered to the best of my ability.  Disposition Plan: Admitted to progressive unit.  Consults called: Cardiology consulted by EDP.  Cardiology also consulted tonight due to persistent bradycardia.  Admission status: Observation status.   Status is: Observation    Darlin Drop MD [facility] Pager [phone]  If 7PM-7AM, please contact night-coverage [email or web address] Password [facility]  11/12/2021, 9:57 PM

## 2021-11-13 ENCOUNTER — Observation Stay (HOSPITAL_COMMUNITY): Payer: Medicare Other

## 2021-11-13 DIAGNOSIS — R9431 Abnormal electrocardiogram [ECG] [EKG]: Secondary | ICD-10-CM

## 2021-11-13 DIAGNOSIS — R001 Bradycardia, unspecified: Secondary | ICD-10-CM | POA: Diagnosis not present

## 2021-11-13 LAB — CBC WITH DIFFERENTIAL/PLATELET
Abs Immature Granulocytes: 0.05 10*3/uL (ref 0.00–0.07)
Basophils Absolute: 0 10*3/uL (ref 0.0–0.1)
Basophils Relative: 0 %
Eosinophils Absolute: 0.1 10*3/uL (ref 0.0–0.5)
Eosinophils Relative: 1 %
HCT: 34.5 % — ABNORMAL LOW (ref 36.0–46.0)
Hemoglobin: 11.9 g/dL — ABNORMAL LOW (ref 12.0–15.0)
Immature Granulocytes: 1 %
Lymphocytes Relative: 12 %
Lymphs Abs: 0.8 10*3/uL (ref 0.7–4.0)
MCH: 32 pg (ref 26.0–34.0)
MCHC: 34.5 g/dL (ref 30.0–36.0)
MCV: 92.7 fL (ref 80.0–100.0)
Monocytes Absolute: 0.5 10*3/uL (ref 0.1–1.0)
Monocytes Relative: 7 %
Neutro Abs: 5.3 10*3/uL (ref 1.7–7.7)
Neutrophils Relative %: 79 %
Platelets: 214 10*3/uL (ref 150–400)
RBC: 3.72 MIL/uL — ABNORMAL LOW (ref 3.87–5.11)
RDW: 13 % (ref 11.5–15.5)
WBC: 6.7 10*3/uL (ref 4.0–10.5)
nRBC: 0 % (ref 0.0–0.2)

## 2021-11-13 LAB — COMPREHENSIVE METABOLIC PANEL
ALT: 12 U/L (ref 0–44)
AST: 15 U/L (ref 15–41)
Albumin: 3.1 g/dL — ABNORMAL LOW (ref 3.5–5.0)
Alkaline Phosphatase: 68 U/L (ref 38–126)
Anion gap: 5 (ref 5–15)
BUN: 18 mg/dL (ref 8–23)
CO2: 24 mmol/L (ref 22–32)
Calcium: 8.9 mg/dL (ref 8.9–10.3)
Chloride: 107 mmol/L (ref 98–111)
Creatinine, Ser: 0.96 mg/dL (ref 0.44–1.00)
GFR, Estimated: 58 mL/min — ABNORMAL LOW (ref >60)
Glucose, Bld: 107 mg/dL — ABNORMAL HIGH (ref 70–99)
Potassium: 3.9 mmol/L (ref 3.5–5.1)
Sodium: 136 mmol/L (ref 135–145)
Total Bilirubin: 0.7 mg/dL (ref 0.3–1.2)
Total Protein: 6 g/dL — ABNORMAL LOW (ref 6.5–8.1)

## 2021-11-13 LAB — TSH: TSH: 2.788 u[IU]/mL (ref 0.350–4.500)

## 2021-11-13 LAB — HEMOGLOBIN A1C
Hgb A1c MFr Bld: 5.8 % — ABNORMAL HIGH (ref 4.8–5.6)
Mean Plasma Glucose: 119.76 mg/dL

## 2021-11-13 LAB — GLUCOSE, CAPILLARY
Glucose-Capillary: 121 mg/dL — ABNORMAL HIGH (ref 70–99)
Glucose-Capillary: 112 mg/dL — ABNORMAL HIGH (ref 70–99)
Glucose-Capillary: 115 mg/dL — ABNORMAL HIGH (ref 70–99)
Glucose-Capillary: 127 mg/dL — ABNORMAL HIGH (ref 70–99)

## 2021-11-13 LAB — ECHOCARDIOGRAM LIMITED
Height: 62 in
Weight: 1987.2 oz

## 2021-11-13 LAB — MAGNESIUM: Magnesium: 1.9 mg/dL (ref 1.7–2.4)

## 2021-11-13 LAB — PHOSPHORUS: Phosphorus: 3.2 mg/dL (ref 2.5–4.6)

## 2021-11-13 NOTE — Progress Notes (Signed)
PROGRESS NOTE    Rita Harding  KKO:[id number] DOB: 10/18/1933 DOA: 11/12/2021 PCP: Etta Grandchild, MD   Brief Narrative:  Rita Harding is a 88 y.o. female with medical history significant for coronary artery disease, takotsubo cardiomyopathy, nonischemic cardiomyopathy, sinus bradycardia, hypertension, hyperlipidemia, history of left lower extremity DVT completed coumadin 2012.  Patient presents with 24 hours of watery diarrhea abdominal pain nausea and vomiting.  In the ED patient was noted to be bradycardic into the 30s.  Hospitalist called for admission and cardiology/EP called for consult.  Assessment & Plan:   Principal Problem:   Sinus bradycardia  Questionable viral gastroenteritis, resolving Abnormal UA, UTI ruled out -Notable source of sigmoiditis/proctitis on CT -Discontinue IV antibiotics in the setting of likely viral gastroenteritis given patient's symptoms have already resolved -Denies sick contacts or recent travel unclear etiology -Patient denies any urinary symptoms -follow clinically now that antibiotics are discontinued  Sinus bradycardia -Home carvedilol discontinued, she has not taken this medication in at least 3 days prior to admission  -Cardiology/EP following, concern patient may need permanent pacemaker -Discussed that patient's above infectious process is likely viral and already resolving.  Antibiotics have been discontinued, if no fevers, leukocytosis in the next 24 hours and symptoms continue to improve patient should be stable for pacemaker placement if deemed appropriate per cardiology.  AKI, suspect prerenal azotemia in the setting of diarrhea -Continue IV fluids until p.o. intake improves -Initially n.p.o. for possible procedure, advance diet as tolerated -no acute issues or events today per discussion with staff   Hyperlipidemia Continue Zetia and home Crestor.   Essential hypertension BP is currently at goal Holding carvedilol and  benazepril given above   Hyperglycemia,  A1c 5.8, continue to follow outpatient no indication for insulin or intervention at this time   HFpEF 60 to 65% Hypovolemic on exam IV fluid ongoing until p.o. intake appropriate  DVT prophylaxis: Lovenox Code Status: Full Family Communication: None present  Status is: Inpatient  Dispo: The patient is from: Home              Anticipated d/c is to: Home              Anticipated d/c date is: 48 to 72 hours pending clinical course and need for surgical intervention as above              Patient currently not medically stable for discharge  Consultants:  Cardiology/EP  Procedures:  Pacemaker placement pending as above  Antimicrobials:  Discontinued 11/13/2021, follow clinically  Subjective: No acute issues or events overnight denies any further episodes of diarrhea nausea vomiting, denies dysuria fevers chills chest pain shortness of breath.  Otherwise in good spirits, somewhat anxious about possible need for pacemaker placement  Objective: Vitals:   11/13/21 0011 11/13/21 0258 11/13/21 0404 11/13/21 0744  BP: (!) 149/62 (!) 147/59 (!) 149/59 (!) 155/77  Pulse: (!) 39 (!) 36 (!) 37 (!) 114  Resp: 18 18  18   Temp: 98.2 F (36.8 C) 98.2 F (36.8 C) 97.9 F (36.6 C) 97.9 F (36.6 C)  TempSrc: Oral Oral Oral Oral  SpO2: 95% 98% 96% 96%  Weight:      Height:        Intake/Output Summary (Last 24 hours) at 11/13/2021 0811 Last data filed at 11/12/2021 2001 Gross per 24 hour  Intake 100.27 ml  Output --  Net 100.27 ml   Filed Weights   11/12/21 1351 11/12/21 2131  Weight: 56.2 kg  56.3 kg    Examination:  General:  Pleasantly resting in bed, No acute distress. HEENT:  Normocephalic atraumatic.  Sclerae nonicteric, noninjected.  Extraocular movements intact bilaterally. Neck:  Without mass or deformity.  Trachea is midline. Lungs:  Clear to auscultate bilaterally without rhonchi, wheeze, or rales. Heart: Bradycardic, sinus  rhythm.  Without murmurs, rubs, or gallops. Abdomen:  Soft, nontender, nondistended.  Without guarding or rebound. Extremities: Without cyanosis, clubbing, edema, or obvious deformity. Vascular:  Dorsalis pedis and posterior tibial pulses palpable bilaterally. Skin:  Warm and dry, no erythema, no ulcerations.  Data Reviewed: I have personally reviewed following labs and imaging studies  CBC: Recent Labs  Lab 11/12/21 1404 11/13/21 0341  WBC 14.7* 6.7  NEUTROABS  --  5.3  HGB 14.8 11.9*  HCT 43.7 34.5*  MCV 92.8 92.7  PLT 263 214   Basic Metabolic Panel: Recent Labs  Lab 11/12/21 1404 11/13/21 0341  NA 140 136  K 4.6 3.9  CL 102 107  CO2 26 24  GLUCOSE 157* 107*  BUN 19 18  CREATININE 1.08* 0.96  CALCIUM 10.9* 8.9  MG 2.0 1.9  PHOS  --  3.2   GFR: Estimated Creatinine Clearance: 33.3 mL/min (by C-G formula based on SCr of 0.96 mg/dL). Liver Function Tests: Recent Labs  Lab 11/12/21 1404 11/13/21 0341  AST 18 15  ALT 15 12  ALKPHOS 73 68  BILITOT 0.8 0.7  PROT 8.1 6.0*  ALBUMIN 4.5 3.1*   Recent Labs  Lab 11/12/21 1404  LIPASE 14   No results for input(s): AMMONIA in the last 168 hours. Coagulation Profile: No results for input(s): INR, PROTIME in the last 168 hours. Cardiac Enzymes: No results for input(s): CKTOTAL, CKMB, CKMBINDEX, TROPONINI in the last 168 hours. BNP (last 3 results) No results for input(s): PROBNP in the last 8760 hours. HbA1C: Recent Labs    11/13/21 0341  HGBA1C 5.8*   CBG: Recent Labs  Lab 11/12/21 2029 11/12/21 2352 11/13/21 0742  GLUCAP 111* 133* 121*   Lipid Profile: No results for input(s): CHOL, HDL, LDLCALC, TRIG, CHOLHDL, LDLDIRECT in the last 72 hours. Thyroid Function Tests: Recent Labs    11/13/21 0341  TSH 2.788   Anemia Panel: No results for input(s): VITAMINB12, FOLATE, FERRITIN, TIBC, IRON, RETICCTPCT in the last 72 hours. Sepsis Labs: Recent Labs  Lab 11/12/21 1927 11/12/21 2135   LATICACIDVEN 1.1 1.1    No results found for this or any previous visit (from the past 240 hour(s)).       Radiology Studies: CT Abdomen Pelvis W Contrast  Result Date: 11/12/2021 CLINICAL DATA:  Abdominal pain, acute, nonlocalized diarrhea EXAM: CT ABDOMEN AND PELVIS WITH CONTRAST TECHNIQUE: Multidetector CT imaging of the abdomen and pelvis was performed using the standard protocol following bolus administration of intravenous contrast. RADIATION DOSE REDUCTION: This exam was performed according to the departmental dose-optimization program which includes automated exposure control, adjustment of the mA and/or kV according to patient size and/or use of iterative reconstruction technique. CONTRAST:  1mL OMNIPAQUE IOHEXOL 300 MG/ML  SOLN COMPARISON:  None Available. FINDINGS: Lower chest: No acute abnormality.  Tiny hiatal hernia. Hepatobiliary: No focal liver abnormality. No gallstones, gallbladder wall thickening, or pericholecystic fluid. No biliary dilatation. Pancreas: No focal lesion. Normal pancreatic contour. No surrounding inflammatory changes. No main pancreatic ductal dilatation. Spleen: Normal in size without focal abnormality. Adrenals/Urinary Tract: No adrenal nodule bilaterally. Bilateral kidneys enhance symmetrically. No hydronephrosis. No hydroureter. Urinary bladder wall thickening likely due to  decompression. Otherwise unremarkable. Stomach/Bowel: Stomach is within normal limits. No evidence of small bowel wall thickening or dilatation. Diffuse bowel thickening and mucosal hyperemia of the sigmoid colon and rectum. Scattered colonic diverticulosis. Appendix appears normal. Vascular/Lymphatic: No abdominal aorta or iliac aneurysm. Atherosclerotic plaque of the aorta and its branches. No abdominal, pelvic, or inguinal lymphadenopathy. Reproductive: Concern device in grossly appropriate position. Uterus and bilateral adnexa are unremarkable. Other: Trace volume free fluid within the  pelvis. No intraperitoneal free gas. No organized fluid collection. Musculoskeletal: No abdominal wall hernia or abnormality. No suspicious lytic or blastic osseous lesions. No acute displaced fracture. Levoscoliosis centered at the L3 level with associated severe multilevel degenerative changes of the spine. IMPRESSION: 1. Sigmoiditis and proctitis. Differential diagnosis for etiology includes infection, inflammation, ischemia. 2. Scattered colonic diverticulosis with no acute diverticulitis. 3. Urinary bladder wall thickening likely due to decompression. Consider correlation with urinalysis to exclude underlying infection. 4. Trace simple free fluid within the pelvis. 5.  Aortic Atherosclerosis (ICD10-I70.0). Electronically Signed   By: Tish Frederickson M.D.   On: 11/12/2021 17:53    Scheduled Meds:  aspirin EC  81 mg Oral Daily   enoxaparin (LOVENOX) injection  30 mg Subcutaneous Q24H   ezetimibe  10 mg Oral Daily   folic acid  1 mg Oral Daily   insulin aspart  0-5 Units Subcutaneous QHS   insulin aspart  0-9 Units Subcutaneous TID WC   rosuvastatin  20 mg Oral Daily   Continuous Infusions:  cefTRIAXone (ROCEPHIN)  IV     And   metronidazole 500 mg (11/13/21 0539)   lactated ringers 50 mL/hr at 11/12/21 2340     LOS: 0 days   Time spent:  Azucena Fallen, DO [facility]  If 7PM-7AM, please contact night-coverage [email or web address]  11/13/2021, 8:11 AM

## 2021-11-13 NOTE — Progress Notes (Signed)
   11/13/21 0011  Assess: MEWS Score  Temp 98.2 F (36.8 C)  BP (!) 149/62  MAP (mmHg) 86  Pulse Rate (!) 39  ECG Heart Rate (!) 39  Resp 18  Level of Consciousness Alert  SpO2 95 %  O2 Device Room Air  Assess: MEWS Score  MEWS Temp 0  MEWS Systolic 0  MEWS Pulse 2  MEWS RR 0  MEWS LOC 0  MEWS Score 2  MEWS Score Color Yellow  Assess: if the MEWS score is Yellow or Red  Were vital signs taken at a resting state? Yes  Focused Assessment No change from prior assessment  Does the patient meet 2 or more of the SIRS criteria? No  MEWS guidelines implemented *See Row Information* Yes  Treat  MEWS Interventions Escalated (See documentation below)  Pain Scale 0-10  Pain Score 0  Take Vital Signs  Increase Vital Sign Frequency  Yellow: Q 2hr X 2 then Q 4hr X 2, if remains yellow, continue Q 4hrs  Escalate  MEWS: Escalate Yellow: discuss with charge nurse/RN and consider discussing with provider and RRT  Notify: Charge Nurse/RN  Name of Charge Nurse/RN Notified Dana, RN  Date Charge Nurse/RN Notified 11/12/21  Time Charge Nurse/RN Notified 0010  Notify: Provider  Provider Name/Title Dr. Hyacinth Meeker  Date Provider Notified 11/13/21  Time Provider Notified 0011  Method of Notification Face-to-face (Provider at bedside with pt.)  Notification Reason Other (Comment) (Pt asymptomatic bradycardia 2 degree HB on admission)  Provider response No new orders (Evaluate pt on monitor pt is asymptomatic)  Date of Provider Response 11/13/21  Time of Provider Response 0011  Assess: SIRS CRITERIA  SIRS Temperature  0  SIRS Pulse 0  SIRS Respirations  0  SIRS WBC 1  SIRS Score Sum  1

## 2021-11-13 NOTE — Consult Note (Signed)
Cardiology Consultation:   Patient ID: Rita Harding MRN: [id number]; DOB: 04/21/1934  Admit date: 11/12/2021 Date of Consult: 11/13/2021  PCP:  Janith Lima, MD   [facility] Providers Cardiologist:  Glenetta Hew, MD  Patient Profile:   Rita Harding is a 88 y.o. female with a hx of very remote Takotsubo CM (recovered), HTN, HLD who is being seen 11/13/2021 for the evaluation of advanced heart block at the request of Dr. Beckie Busing.  History of Present Illness:   Rita Harding last saw Dr. Ellyn Hack 09/12/21, she had not been seen in a couple years, doing OK, though reported some lightheadedness/dizziness.  BP was a little elevated and ACE resumed, HR was 95.  Planned to update her echo with a murmur on exam. Her echo ordered from this visit noted LVEF 60-65%, no WMA, eccentric MR at least mild.  She was admitted yesterday with c/o diarrhea and abdominal pain/cramping, work-up revealed sigmoiditis and proctitis seen on contrast CT abdomen/pelvis.  Additionally her UA was positive for pyuria.  She was started on Rocephin and IV Flagyl empirically. She was observed as well to have bradycardia associated with 2:1 AV block. She was admitted her home coreg held though H&P reports that she had not been taking either her coreg or benazepril for a couple days, not feeling well.  Cardiology consulted 2/2 her AVblock, notes that in the past her coreg dose had been reduced 2/2 prior bradycardia.  He did not elicit and particular symptoms associated to bradycardia and recommended ambulation to evaluate HR/rhythm with exertion. Dr. Harl Bowie saw her this AM and asks EP to weigh in.   Limited echo  today LVEF 55-60%  LABS K+ 4.6 > 3.9 Mag 2.0, 1.9 BUN/Creat 19/108 > 0.96 HS Trop 17, 56 BNP 230 WBC 14.7 > 6.7 H/H 11.9/34 Plts 214 TSH 2.788  She reports that for 2 days prior to coming in she did not take her medicines, simply forgot to.  The day prior to coming in started with both nausea  and diarrhea, very unusual for her with some abd discomfort/cramping.  All persisted the next day and she came in.  No CP, SOB, no dizziness, lightheadedness. She reports never fainting, has never felt near faint.   Past Medical History:  Diagnosis Date   Coronary artery disease 2007   non-critcal coronary single disease   History of DVT (deep vein thrombosis)    "LLE"; BEEN OFF  COUMADIN SINCE Sept 2012   Hyperlipidemia    Hypertension    MI (myocardial infarction) ([place]) 2007   Thought to be takotsubo cardiomyopathy   Takotsubo cardiomyopathy 2007   Result with normal EF as OF 2013    Past Surgical History:  Procedure Laterality Date   AMPUTATION Left 08/21/2018   Procedure: REVISION AMPUTATION LEFT RING FINGER AND INCISION AND DRAINAGE LEFT LONG FINGER;  Surgeon: Leanora Cover, MD;  Location: [facility];  Service: Orthopedics;  Laterality: Left;   APPENDECTOMY     CARDIAC CATHETERIZATION  01/03/2006    50-60% LAD with some myocardial  bridging; NON ISCHEMIC  CARDIOMYOPATHY  with EF  30 to 35% no evidence of infarct -- anteroapical and inferapical wall motin abnormalities compatilble with Takotsubo-type syndrome   DOPPLER LEFT LOWER EXTREMITY ([place] [facility]) Left 01/29/2011   left saphenous vein -small amt of chroinc appearing calicification on anterior wall that was nonobstructing,mildly abnormal    DOPPLER RIGHT ADDITIONAL EXTR ([facility] HX) Right 01/29/2011   FIBULA FRACTURE SURGERY Left 2009  UNDER CARE DR Netta Cedars   FRACTURE SURGERY     NM MYOVIEW LTD  02/21/2012   low risk  and normal EF 70% or greater   TONSILLECTOMY     TRANSTHORACIC ECHOCARDIOGRAM  07/31/2010   no MVP ,NORMAL SYSTOLIC FUNCTION (EF [id number]). Mild Ao Sclerosis.       Home Medications:  Prior to Admission medications   Medication Sig Start Date End Date Taking? Authorizing Provider  aspirin EC 81 MG tablet Take 81 mg by mouth daily.   Yes [provider]  benazepril (LOTENSIN) 5 MG tablet  Take 0.5 tablets (2.5 mg total) by mouth daily. 09/12/21  Yes Leonie Man, MD  carvedilol (COREG) 3.125 MG tablet TAKE 1 TABLET BY MOUTH TWICE DAILY WITH A MEAL Patient taking differently: Take 3.125 mg by mouth 2 (two) times daily with a meal. 11/09/21  Yes Janith Lima, MD  ezetimibe (ZETIA) 10 MG tablet Take 1 tablet (10 mg total) by mouth daily. 01/09/21  Yes Janith Lima, MD  rosuvastatin (CRESTOR) 20 MG tablet Take 1 tablet (20 mg total) by mouth daily. 09/12/21 12/11/21 Yes Leonie Man, MD  folic acid (FOLVITE) 1 MG tablet Take 1 tablet (1 mg total) by mouth daily. Patient not taking: Reported on 09/12/2021 01/09/21   Janith Lima, MD    Inpatient Medications: Scheduled Meds:  aspirin EC  81 mg Oral Daily   enoxaparin (LOVENOX) injection  30 mg Subcutaneous Q24H   ezetimibe  10 mg Oral Daily   folic acid  1 mg Oral Daily   insulin aspart  0-5 Units Subcutaneous QHS   insulin aspart  0-9 Units Subcutaneous TID WC   rosuvastatin  20 mg Oral Daily   Continuous Infusions:  lactated ringers 50 mL/hr at 11/12/21 2340   PRN Meds: acetaminophen, HYDROmorphone (DILAUDID) injection, melatonin, ondansetron (ZOFRAN) IV, oxyCODONE, polyethylene glycol  Allergies:    Allergies  Allergen Reactions   Codeine Nausea And Vomiting   Tetanus Toxoids Other (See Comments)    Swollen and red ( patient informed that she had to take it in three doses)    Social History:   Social History   Socioeconomic History   Marital status: Widowed    Spouse name: Not on file   Number of children: Not on file   Years of education: Not on file   Highest education level: Not on file  Occupational History   Not on file  Tobacco Use   Smoking status: Former    Packs/day: 1.00    Years: 15.00    Pack years: 15.00    Types: Cigarettes   Smokeless tobacco: Never   Tobacco comments:    "quit smoking in the 1960s"  Vaping Use   Vaping Use: Never used  Substance and Sexual Activity   Alcohol use:  Yes    Alcohol/week: 25.0 standard drinks    Types: 25 Glasses of wine per week   Drug use: No   Sexual activity: Not Currently  Other Topics Concern   Not on file  Social History Narrative   Not on file   Social Determinants of Health   Financial Resource Strain: Not on file  Food Insecurity: Not on file  Transportation Needs: Not on file  Physical Activity: Not on file  Stress: Not on file  Social Connections: Not on file  Intimate Partner Violence: Not on file    Family History:   Family History  Problem Relation Age of Onset  Cancer Mother    Heart attack Father    Cancer Maternal Grandmother    Heart disease Paternal Grandmother    Heart disease Paternal Grandfather      ROS:  Please see the history of present illness.  All other ROS reviewed and negative.     Physical Exam/Data:   Vitals:   11/13/21 0258 11/13/21 0404 11/13/21 0744 11/13/21 1129  BP: (!) 147/59 (!) 149/59 (!) 155/77 (!) 163/65  Pulse: (!) 36 (!) 37 (!) 114 (!) 34  Resp: 18  18 18   Temp: 98.2 F (36.8 C) 97.9 F (36.6 C) 97.9 F (36.6 C) 97.7 F (36.5 C)  TempSrc: Oral Oral Oral Oral  SpO2: 98% 96% 96% 98%  Weight:      Height:        Intake/Output Summary (Last 24 hours) at 11/13/2021 1236 Last data filed at 11/12/2021 2001 Gross per 24 hour  Intake 100.27 ml  Output --  Net 100.27 ml      11/12/2021    9:31 PM 11/12/2021    1:51 PM 09/12/2021    1:53 PM  Last 3 Weights  Weight (lbs) 124 lb 3.2 oz 124 lb 134 lb 6.4 oz  Weight (kg) 56.337 kg 56.246 kg 60.963 kg     Body mass index is 22.72 kg/m.  General:  Well nourished, well developed, in no acute distres HEENT: normal Neck: no JVD Vascular: No carotid bruits; Distal pulses 2+ bilaterally Cardiac:  RRR; no murmurs, gallops or rubs Lungs:  CTA b/l, no wheezing, rhonchi or rales  Abd: soft, nontender  Ext: no edema Musculoskeletal:  No deformities,age appropriate atrophy Skin: warm and dry  Neuro: no focal abnormalities  noted Psych:  Normal affect   EKG:  The EKG was personally reviewed and demonstrates:    #1 looks like ST with block in a trigeminal pattern, A rate about 120, V  rate 81, LBBB-like, 110ms  #2 is 2:1 AVblock, V rate 42, S/T changes are more pronounced  OLD Jun 22, 2021: SB 55bpm, 1st degree AVblock 259ms, RBBB, LAD 03/01/2019 SR 67bpm, normal intervals, LAD   Telemetry:  Telemetry was personally reviewed and demonstrates:   Largely in 2:1 AVblock, she has had some rare periods of brief CHB and rarely conducting 1:1 also briefly She is having alternating BBB/QRS morphologies on telemetry  Relevant CV Studies:  11/13/21: TTE (limited) 1. Limited study for LVEF. EF 60%, unchanged from prior.   2. Left ventricular ejection fraction, by estimation, is 55 to 60%. The  left ventricle has normal function. The left ventricle has no regional  wall motion abnormalities.   3. Right ventricular systolic function is normal. The right ventricular  size is normal.   4. The mitral valve is grossly normal.   5. The inferior vena cava is normal in size with greater than 50%  respiratory variability, suggesting right atrial pressure of 3 mmHg  10/01/2021: TTE  1. Left ventricular ejection fraction, by estimation, is 60 to 65%. The  left ventricle has normal function. The left ventricle has no regional  wall motion abnormalities. There is moderate left ventricular hypertrophy.  Left ventricular diastolic  parameters are indeterminate.   2. Right ventricular systolic function is normal. The right ventricular  size is normal. There is normal pulmonary artery systolic pressure.   3. MR is very eccentric Directed anteriorly along wall of LA At least  mild in severity.   4. The aortic valve is tricuspid. Aortic valve regurgitation is mild.  Aortic valve sclerosis is present, with no evidence of aortic valve  stenosis.   5. The inferior vena cava is normal in size with greater than 50%  respiratory  variability, suggesting right atrial pressure of 3 mmHg.   Comparison(s): Report only 07/31/10 EF >55%.   Laboratory Data:  High Sensitivity Troponin:   Recent Labs  Lab 11/12/21 1404 11/12/21 1730  TROPONINIHS 17 56*     Chemistry Recent Labs  Lab 11/12/21 1404 11/13/21 0341  NA 140 136  K 4.6 3.9  CL 102 107  CO2 26 24  GLUCOSE 157* 107*  BUN 19 18  CREATININE 1.08* 0.96  CALCIUM 10.9* 8.9  MG 2.0 1.9  GFRNONAA 50* 58*  ANIONGAP 12 5    Recent Labs  Lab 11/12/21 1404 11/13/21 0341  PROT 8.1 6.0*  ALBUMIN 4.5 3.1*  AST 18 15  ALT 15 12  ALKPHOS 73 68  BILITOT 0.8 0.7   Lipids No results for input(s): CHOL, TRIG, HDL, LABVLDL, LDLCALC, CHOLHDL in the last 168 hours.  Hematology Recent Labs  Lab 11/12/21 1404 11/13/21 0341  WBC 14.7* 6.7  RBC 4.71 3.72*  HGB 14.8 11.9*  HCT 43.7 34.5*  MCV 92.8 92.7  MCH 31.4 32.0  MCHC 33.9 34.5  RDW 12.8 13.0  PLT 263 214   Thyroid  Recent Labs  Lab 11/13/21 0341  TSH 2.788    BNP Recent Labs  Lab 11/12/21 1404  BNP 230.3*    DDimer No results for input(s): DDIMER in the last 168 hours.   Radiology/Studies:  CT Abdomen Pelvis W Contrast  Result Date: 11/12/2021 CLINICAL DATA:  Abdominal pain, acute, nonlocalized diarrhea EXAM: CT ABDOMEN AND PELVIS WITH CONTRAST TECHNIQUE: Multidetector CT imaging of the abdomen and pelvis was performed using the standard protocol following bolus administration of intravenous contrast. RADIATION DOSE REDUCTION: This exam was performed according to the departmental dose-optimization program which includes automated exposure control, adjustment of the mA and/or kV according to patient size and/or use of iterative reconstruction technique. CONTRAST:  66mL OMNIPAQUE IOHEXOL 300 MG/ML  SOLN COMPARISON:  None Available. FINDINGS: Lower chest: No acute abnormality.  Tiny hiatal hernia. Hepatobiliary: No focal liver abnormality. No gallstones, gallbladder wall thickening, or  pericholecystic fluid. No biliary dilatation. Pancreas: No focal lesion. Normal pancreatic contour. No surrounding inflammatory changes. No main pancreatic ductal dilatation. Spleen: Normal in size without focal abnormality. Adrenals/Urinary Tract: No adrenal nodule bilaterally. Bilateral kidneys enhance symmetrically. No hydronephrosis. No hydroureter. Urinary bladder wall thickening likely due to decompression. Otherwise unremarkable. Stomach/Bowel: Stomach is within normal limits. No evidence of small bowel wall thickening or dilatation. Diffuse bowel thickening and mucosal hyperemia of the sigmoid colon and rectum. Scattered colonic diverticulosis. Appendix appears normal. Vascular/Lymphatic: No abdominal aorta or iliac aneurysm. Atherosclerotic plaque of the aorta and its branches. No abdominal, pelvic, or inguinal lymphadenopathy. Reproductive: Concern device in grossly appropriate position. Uterus and bilateral adnexa are unremarkable. Other: Trace volume free fluid within the pelvis. No intraperitoneal free gas. No organized fluid collection. Musculoskeletal: No abdominal wall hernia or abnormality. No suspicious lytic or blastic osseous lesions. No acute displaced fracture. Levoscoliosis centered at the L3 level with associated severe multilevel degenerative changes of the spine. IMPRESSION: 1. Sigmoiditis and proctitis. Differential diagnosis for etiology includes infection, inflammation, ischemia. 2. Scattered colonic diverticulosis with no acute diverticulitis. 3. Urinary bladder wall thickening likely due to decompression. Consider correlation with urinalysis to exclude underlying infection. 4. Trace simple free fluid within the pelvis. 5.  Aortic  Atherosclerosis (ICD10-I70.0). Electronically Signed   By: Iven Finn M.D.   On: 11/12/2021 17:53    Assessment and Plan:   Advanced heart block She has had advancing conduction system disease over the last couple years. Off coreg here (and at home  a couple days prior to coming in) No other reversible causes.  She will need a PPM, though will need to pause given perhaps an active infection, ? WBC has normalized w/antibiotics She is afebrile BP is stable  She had another episode of diarrhea today, no vomiting  Dr. Quentin Ore will see her, I do not think she needs a temp wire, she is at least at rest largely asymptomatic currently      Risk Assessment/Risk Scores:    For questions or updates, please contact [facility] Please consult [email or web address] for contact info under    Signed, Baldwin Jamaica, PA-C  11/13/2021 12:36 PM

## 2021-11-13 NOTE — Plan of Care (Signed)
  Problem: Metabolic: Goal: Ability to maintain appropriate glucose levels will improve Outcome: Progressing   Problem: Tissue Perfusion: Goal: Adequacy of tissue perfusion will improve Outcome: Progressing   Problem: Cardiac: Goal: Ability to achieve and maintain adequate cardiopulmonary perfusion will improve Outcome: Progressing

## 2021-11-13 NOTE — Consult Note (Signed)
Cardiology Consultation:   Patient ID: Rita Harding MRN: [id number]; DOB: 03/27/1934  Admit date: 11/12/2021 Date of Consult: 11/13/2021  Primary Care Provider: Etta Grandchild, MD Primary Cardiologist: Rita Lemma, MD  Primary Electrophysiologist:  None    Patient Profile:   Rita Harding is a 88 y.o. female with a hx of hypertension, nonobstructive coronary disease, hyperlipidemia who is being seen today for the evaluation of bradycardia at the request of hospital medicine.  History of Present Illness:   Rita Harding is an 88 year old female with hypertension, hyperlipidemia, nonobstructive coronary disease who presented initially to stand-alone emergency department after 24 hours of significant diarrhea and vomiting.  Work-up revealed leukocytosis and imaging revealed likely infection of the sigmoid colon and rectum.  She is admitted to the hospital medicine service for IV antibiotics and resuscitation with monitoring.  While admitted, she is noted to have significant sinus bradycardia with heart rates ranging from the 30s to 50s.  On my evaluation, she is asymptomatic.  She notes that she is no longer having diarrhea.  On a regular basis she is able to complete her activities of daily living.  Noted that just prior to coming to the emergency department she was cleaning her bathroom with no issues.  She is a patient of Dr. Elissa Harding.  She is followed him for nonobstructive coronary disease and had a remote history of Takotsubo cardiomyopathy that has since recovered.  Her recent echo in April had normal ejection fraction with mild to moderate LVH but no wall motion abnormalities.  She was bradycardic on the echo with heart rates in the 40s and 50s.  EKG on arrival notes sinus bradycardia that could be high-grade AV block.  No dropped beats.  Patient is hemodynamically stable tonight.  Past Medical History:  Diagnosis Date   Coronary artery disease 2007   non-critcal coronary  single disease   History of DVT (deep vein thrombosis)    "LLE"; BEEN OFF  COUMADIN SINCE Sept 2012   Hyperlipidemia    Hypertension    MI (myocardial infarction) (HCC) 2007   Thought to be takotsubo cardiomyopathy   Takotsubo cardiomyopathy 2007   Result with normal EF as OF 2013    Past Surgical History:  Procedure Laterality Date   AMPUTATION Left 08/21/2018   Procedure: REVISION AMPUTATION LEFT RING FINGER AND INCISION AND DRAINAGE LEFT LONG FINGER;  Surgeon: Betha Loa, MD;  Location: [facility];  Service: Orthopedics;  Laterality: Left;   APPENDECTOMY     CARDIAC CATHETERIZATION  01/03/2006    50-60% LAD with some myocardial  bridging; NON ISCHEMIC  CARDIOMYOPATHY  with EF  30 to 35% no evidence of infarct -- anteroapical and inferapical wall motin abnormalities compatilble with Takotsubo-type syndrome   DOPPLER LEFT LOWER EXTREMITY ([facility] HX) Left 01/29/2011   left saphenous vein -small amt of chroinc appearing calicification on anterior wall that was nonobstructing,mildly abnormal    DOPPLER RIGHT ADDITIONAL EXTR ([facility] HX) Right 01/29/2011   FIBULA FRACTURE SURGERY Left 2009   UNDER CARE DR Brett Canales [facility]   FRACTURE SURGERY     NM MYOVIEW LTD  02/21/2012   low risk  and normal EF 70% or greater   TONSILLECTOMY     TRANSTHORACIC ECHOCARDIOGRAM  07/31/2010   no MVP ,NORMAL SYSTOLIC FUNCTION (EF >55%). Mild Ao Sclerosis.       Home Medications:  Prior to Admission medications   Medication Sig Start Date End Date Taking? Authorizing Provider  aspirin EC 81 MG  tablet Take 81 mg by mouth daily.    [provider]  benazepril (LOTENSIN) 5 MG tablet Take 0.5 tablets (2.5 mg total) by mouth daily. 09/12/21   Marykay Lex, MD  carvedilol (COREG) 3.125 MG tablet TAKE 1 TABLET BY MOUTH TWICE DAILY WITH A MEAL 11/09/21   Rita Grandchild, MD  ezetimibe (ZETIA) 10 MG tablet Take 1 tablet (10 mg total) by mouth daily. 01/09/21   Rita Grandchild, MD  folic acid  (FOLVITE) 1 MG tablet Take 1 tablet (1 mg total) by mouth daily. Patient not taking: Reported on 09/12/2021 01/09/21   Rita Grandchild, MD  Multiple Vitamin (MULTIVITAMIN) tablet Take 1 tablet by mouth daily. Patient not taking: Reported on 09/12/2021    [provider]  rosuvastatin (CRESTOR) 20 MG tablet Take 1 tablet (20 mg total) by mouth daily. 09/12/21 12/11/21  Marykay Lex, MD    Inpatient Medications: Scheduled Meds:  aspirin EC  81 mg Oral Daily   enoxaparin (LOVENOX) injection  30 mg Subcutaneous Q24H   ezetimibe  10 mg Oral Daily   folic acid  1 mg Oral Daily   insulin aspart  0-5 Units Subcutaneous QHS   insulin aspart  0-9 Units Subcutaneous TID WC   rosuvastatin  20 mg Oral Daily   Continuous Infusions:  cefTRIAXone (ROCEPHIN)  IV     And   metronidazole     lactated ringers 50 mL/hr at 11/12/21 2340   PRN Meds: acetaminophen, HYDROmorphone (DILAUDID) injection, melatonin, ondansetron (ZOFRAN) IV, oxyCODONE, polyethylene glycol  Allergies:    Allergies  Allergen Reactions   Codeine Nausea And Vomiting   Tetanus Toxoids Other (See Comments)    Swollen and red ( patient informed that she had to take it in three doses)    Social History:   Social History   Socioeconomic History   Marital status: Widowed    Spouse name: Not on file   Number of children: Not on file   Years of education: Not on file   Highest education level: Not on file  Occupational History   Not on file  Tobacco Use   Smoking status: Former    Packs/day: 1.00    Years: 15.00    Pack years: 15.00    Types: Cigarettes   Smokeless tobacco: Never   Tobacco comments:    "quit smoking in the 1960s"  Vaping Use   Vaping Use: Never used  Substance and Sexual Activity   Alcohol use: Yes    Alcohol/week: 25.0 standard drinks    Types: 25 Glasses of wine per week   Drug use: No   Sexual activity: Not Currently  Other Topics Concern   Not on file  Social History Narrative   Not on  file   Social Determinants of Health   Financial Resource Strain: Not on file  Food Insecurity: Not on file  Transportation Needs: Not on file  Physical Activity: Not on file  Stress: Not on file  Social Connections: Not on file  Intimate Partner Violence: Not on file    Family History:    Family History  Problem Relation Age of Onset   Cancer Mother    Heart attack Father    Cancer Maternal Grandmother    Heart disease Paternal Grandmother    Heart disease Paternal Grandfather      Review of Systems: [y] = yes, [ ]  = no    General: Weight gain [ ] ; Weight loss [ ] ;  Anorexia ; Fatigue ; Fever ; Chills ; Weakness   Cardiac: Chest pain/pressure ; Resting SOB ; Exertional SOB ; Orthopnea ; Pedal Edema ; Palpitations ; Syncope ; Presyncope ; Paroxysmal nocturnal dyspnea[ ]   Pulmonary: Cough ; Wheezing[ ] ; Hemoptysis[ ] ; Sputum ; Snoring   GI: Vomiting[ ] ; Dysphagia[ ] ; Melena[ ] ; Hematochezia ; Heartburn[ ] ; Abdominal pain ; Constipation ; Diarrhea ; BRBPR   GU: Hematuria[ ] ; Dysuria ; Nocturia[ ]   Vascular: Pain in legs with walking ; Pain in feet with lying flat ; Non-healing sores ; Stroke ; TIA ; Slurred speech ;  Neuro: Headaches[ ] ; Vertigo[ ] ; Seizures[ ] ; Paresthesias[ ] ;Blurred vision ; Diplopia ; Vision changes   Ortho/Skin: Arthritis ; Joint pain ; Muscle pain ; Joint swelling ; Back Pain ; Rash   Psych: Depression[ ] ; Anxiety[ ]   Heme: Bleeding problems ; Clotting disorders ; Anemia   Endocrine: Diabetes ; Thyroid dysfunction[ ]   Physical Exam/Data:   Vitals:   11/12/21 1840 11/12/21 1940 11/12/21 2131 11/13/21 0011  BP: (!) 154/59 (!) 167/83 124/87 (!) 149/62  Pulse: (!) 38 (!) 41 (!) 36 (!) 39  Resp: Temp:  98.3 F (36.8 C) 98.2 F (36.8 C) 98.2 F (36.8 C)  TempSrc:  Oral Oral Oral  SpO2: 95% 97% 96% 95%  Weight:   56.3 kg    Height:    (1.575 m)     Intake/Output Summary (Last 24 hours) at 11/13/2021 0049 Last data filed at 11/12/2021 2001 Gross per 24 hour  Intake 100.27 ml  Output --  Net 100.27 ml   Filed Weights   11/12/21 1351 11/12/21 2131  Weight: 56.2 kg 56.3 kg   Body mass index is 22.72 kg/m.  General:  Well nourished, well developed, in no acute distress HEENT: normal Lymph: no adenopathy Neck: no JVD Endocrine:  No thryomegaly Vascular: No carotid bruits; FA pulses 2+ bilaterally without bruits  Cardiac:  normal S1, S2; bradycardic and regular rhythm; no murmur  Lungs:  clear to auscultation bilaterally, no wheezing, rhonchi or rales  Abd: soft, nontender, no hepatomegaly  Ext: no edema Musculoskeletal:  No deformities, BUE and BLE strength normal and equal Skin: warm and dry  Neuro:  CNs 2-12 intact, no focal abnormalities noted Psych:  Normal affect   EKG:  The EKG was personally reviewed and demonstrates: Sinus bradycardia however there is a P wave after every T wave.  Could be higher grade AV block.   Telemetry:  Telemetry was personally reviewed and demonstrates: Sinus bradycardia with heart rates in the 30s to 40s.  Up to the 50s when she sits up.  Relevant CV Studies: Echo 09/2021: IMPRESSIONS     1. Left ventricular ejection fraction, by estimation, is 60 to 65%. The  left ventricle has normal function. The left ventricle has no regional  wall motion abnormalities. There is moderate left ventricular hypertrophy.  Left ventricular diastolic  parameters are indeterminate.   2. Right ventricular systolic function is normal. The right ventricular  size is normal. There is normal pulmonary artery systolic pressure.   3. MR is very eccentric Directed anteriorly along wall of LA At least  mild in severity.   4. The aortic valve is tricuspid.  Aortic valve regurgitation is mild.  Aortic valve sclerosis is present, with no evidence of aortic valve  stenosis.   5. The  inferior vena cava is normal in size with greater than 50%  respiratory variability, suggesting right atrial pressure of 3 mmHg.   Laboratory Data:  Chemistry Recent Labs  Lab 11/12/21 1404  NA 140  K 4.6  CL 102  CO2 26  GLUCOSE 157*  BUN 19  CREATININE 1.08*  CALCIUM 10.9*  GFRNONAA 50*  ANIONGAP 12    Recent Labs  Lab 11/12/21 1404  PROT 8.1  ALBUMIN 4.5  AST 18  ALT 15  ALKPHOS 73  BILITOT 0.8   Hematology Recent Labs  Lab 11/12/21 1404  WBC 14.7*  RBC 4.71  HGB 14.8  HCT 43.7  MCV 92.8  MCH 31.4  MCHC 33.9  RDW 12.8  PLT 263   Cardiac EnzymesNo results for input(s): TROPONINI in the last 168 hours. No results for input(s): TROPIPOC in the last 168 hours.  BNP Recent Labs  Lab 11/12/21 1404  BNP 230.3*    DDimer No results for input(s): DDIMER in the last 168 hours.  Radiology/Studies:  CT Abdomen Pelvis W Contrast  Result Date: 11/12/2021 CLINICAL DATA:  Abdominal pain, acute, nonlocalized diarrhea EXAM: CT ABDOMEN AND PELVIS WITH CONTRAST TECHNIQUE: Multidetector CT imaging of the abdomen and pelvis was performed using the standard protocol following bolus administration of intravenous contrast. RADIATION DOSE REDUCTION: This exam was performed according to the departmental dose-optimization program which includes automated exposure control, adjustment of the mA and/or kV according to patient size and/or use of iterative reconstruction technique. CONTRAST:  75mL OMNIPAQUE IOHEXOL 300 MG/ML  SOLN COMPARISON:  None Available. FINDINGS: Lower chest: No acute abnormality.  Tiny hiatal hernia. Hepatobiliary: No focal liver abnormality. No gallstones, gallbladder wall thickening, or pericholecystic fluid. No biliary dilatation. Pancreas: No focal lesion. Normal pancreatic contour. No surrounding inflammatory changes. No main pancreatic ductal dilatation. Spleen: Normal in size without focal abnormality. Adrenals/Urinary Tract: No adrenal nodule bilaterally.  Bilateral kidneys enhance symmetrically. No hydronephrosis. No hydroureter. Urinary bladder wall thickening likely due to decompression. Otherwise unremarkable. Stomach/Bowel: Stomach is within normal limits. No evidence of small bowel wall thickening or dilatation. Diffuse bowel thickening and mucosal hyperemia of the sigmoid colon and rectum. Scattered colonic diverticulosis. Appendix appears normal. Vascular/Lymphatic: No abdominal aorta or iliac aneurysm. Atherosclerotic plaque of the aorta and its branches. No abdominal, pelvic, or inguinal lymphadenopathy. Reproductive: Concern device in grossly appropriate position. Uterus and bilateral adnexa are unremarkable. Other: Trace volume free fluid within the pelvis. No intraperitoneal free gas. No organized fluid collection. Musculoskeletal: No abdominal wall hernia or abnormality. No suspicious lytic or blastic osseous lesions. No acute displaced fracture. Levoscoliosis centered at the L3 level with associated severe multilevel degenerative changes of the spine. IMPRESSION: 1. Sigmoiditis and proctitis. Differential diagnosis for etiology includes infection, inflammation, ischemia. 2. Scattered colonic diverticulosis with no acute diverticulitis. 3. Urinary bladder wall thickening likely due to decompression. Consider correlation with urinalysis to exclude underlying infection. 4. Trace simple free fluid within the pelvis. 5.  Aortic Atherosclerosis (ICD10-I70.0). Electronically Signed   By: Tish Frederickson M.D.   On: 11/12/2021 17:53    Assessment and Plan:   # Bradycardia.   She has had ongoing bouts of bradycardia over the last several weeks to months.  Dr. Herbie Baltimore is seen her in clinic and has reduced her carvedilol.  She reports that she has not taken on the last couple days,  and sometimes has intermittent compliance with her blood pressure medications.  I would discontinue the carvedilol for now.  She seems rather stable with these heart rates, however  I am concerned that she may be in high-grade AV block.  However, she is actively being treated for an infection so likely not the best time to implant a pacemaker until we have treated the infection.  If able, would get her up and walk around to see what her heart rate does and see if she is symptomatic.  Would check orthostatic vitals as well.  We will have our EP colleagues evaluate and determine if she needs pacemaker implantation during this hospitalization or just needs close follow-up.  Suspicion is that she will just need close follow-up.  No need for repeat echo during this hospitalization.  Recommendations: - discontinue carvedilol - when able, walk her and perform orthostatic vitals to see if she has chronotropic competence  - we will have EP evaluate       For questions or updates, please contact [facility] Please consult [email or web address] for contact info under     Signed, Joellen Jersey, MD  11/13/2021 12:49 AM

## 2021-11-13 NOTE — Progress Notes (Signed)
Progress Note  Patient Name: Rita Harding Date of Encounter: 11/13/2021  [facility] Cardiologist: Glenetta Hew, MD   Subjective   She feels well. Her symptoms have improved, no more nausea. No LH or dizziness.  She has normal renal functio. K 3.9  Inpatient Medications    Scheduled Meds:  aspirin EC  81 mg Oral Daily   enoxaparin (LOVENOX) injection  30 mg Subcutaneous Q24H   ezetimibe  10 mg Oral Daily   folic acid  1 mg Oral Daily   insulin aspart  0-5 Units Subcutaneous QHS   insulin aspart  0-9 Units Subcutaneous TID WC   rosuvastatin  20 mg Oral Daily   Continuous Infusions:  cefTRIAXone (ROCEPHIN)  IV     And   metronidazole 500 mg (11/13/21 0539)   lactated ringers 50 mL/hr at 11/12/21 2340   PRN Meds: acetaminophen, HYDROmorphone (DILAUDID) injection, melatonin, ondansetron (ZOFRAN) IV, oxyCODONE, polyethylene glycol   Vital Signs    Vitals:   11/13/21 0011 11/13/21 0258 11/13/21 0404 11/13/21 0744  BP: (!) 149/62 (!) 147/59 (!) 149/59 (!) 155/77  Pulse: (!) 39 (!) 36 (!) 37 (!) 114  Resp: 18 18  18   Temp: 98.2 F (36.8 C) 98.2 F (36.8 C) 97.9 F (36.6 C) 97.9 F (36.6 C)  TempSrc: Oral Oral Oral Oral  SpO2: 95% 98% 96% 96%  Weight:      Height:        Intake/Output Summary (Last 24 hours) at 11/13/2021 0944 Last data filed at 11/12/2021 2001 Gross per 24 hour  Intake 100.27 ml  Output --  Net 100.27 ml      11/12/2021    9:31 PM 11/12/2021    1:51 PM 09/12/2021    1:53 PM  Last 3 Weights  Weight (lbs) 124 lb 3.2 oz 124 lb 134 lb 6.4 oz  Weight (kg) 56.337 kg 56.246 kg 60.963 kg      Telemetry    2:1 AV block - Personally Reviewed  ECG    11/12/2021 13:54 Bigeminy PACs, LBBB, 15:02 LVH/ 2:1 AV block, LVH- Personally Reviewed  Physical Exam   Vitals:   11/13/21 0404 11/13/21 0744  BP: (!) 149/59 (!) 155/77  Pulse: (!) 37 (!) 114  Resp:  18  Temp: 97.9 F (36.6 C) 97.9 F (36.6 C)  SpO2: 96% 96%    GEN: No acute distress.    Neck: No JVD Cardiac: RRR, no murmurs, rubs, or gallops.  Respiratory: Clear to auscultation bilaterally. GI: Soft, nontender, non-distended  MS: No edema; No deformity. Neuro:  Nonfocal  Psych: Normal affect   Labs    High Sensitivity Troponin:   Recent Labs  Lab 11/12/21 1404 11/12/21 1730  TROPONINIHS 17 56*     Chemistry Recent Labs  Lab 11/12/21 1404 11/13/21 0341  NA 140 136  K 4.6 3.9  CL 102 107  CO2 26 24  GLUCOSE 157* 107*  BUN 19 18  CREATININE 1.08* 0.96  CALCIUM 10.9* 8.9  MG 2.0 1.9  PROT 8.1 6.0*  ALBUMIN 4.5 3.1*  AST 18 15  ALT 15 12  ALKPHOS 73 68  BILITOT 0.8 0.7  GFRNONAA 50* 58*  ANIONGAP 12 5    Lipids No results for input(s): CHOL, TRIG, HDL, LABVLDL, LDLCALC, CHOLHDL in the last 168 hours.  Hematology Recent Labs  Lab 11/12/21 1404 11/13/21 0341  WBC 14.7* 6.7  RBC 4.71 3.72*  HGB 14.8 11.9*  HCT 43.7 34.5*  MCV 92.8 92.7  MCH 31.4 32.0  MCHC 33.9 34.5  RDW 12.8 13.0  PLT 263 214   Thyroid  Recent Labs  Lab 11/13/21 0341  TSH 2.788    BNP Recent Labs  Lab 11/12/21 1404  BNP 230.3*    DDimer No results for input(s): DDIMER in the last 168 hours.   Radiology    CT Abdomen Pelvis W Contrast  Result Date: 11/12/2021 CLINICAL DATA:  Abdominal pain, acute, nonlocalized diarrhea EXAM: CT ABDOMEN AND PELVIS WITH CONTRAST TECHNIQUE: Multidetector CT imaging of the abdomen and pelvis was performed using the standard protocol following bolus administration of intravenous contrast. RADIATION DOSE REDUCTION: This exam was performed according to the departmental dose-optimization program which includes automated exposure control, adjustment of the mA and/or kV according to patient size and/or use of iterative reconstruction technique. CONTRAST:  2mL OMNIPAQUE IOHEXOL 300 MG/ML  SOLN COMPARISON:  None Available. FINDINGS: Lower chest: No acute abnormality.  Tiny hiatal hernia. Hepatobiliary: No focal liver abnormality. No  gallstones, gallbladder wall thickening, or pericholecystic fluid. No biliary dilatation. Pancreas: No focal lesion. Normal pancreatic contour. No surrounding inflammatory changes. No main pancreatic ductal dilatation. Spleen: Normal in size without focal abnormality. Adrenals/Urinary Tract: No adrenal nodule bilaterally. Bilateral kidneys enhance symmetrically. No hydronephrosis. No hydroureter. Urinary bladder wall thickening likely due to decompression. Otherwise unremarkable. Stomach/Bowel: Stomach is within normal limits. No evidence of small bowel wall thickening or dilatation. Diffuse bowel thickening and mucosal hyperemia of the sigmoid colon and rectum. Scattered colonic diverticulosis. Appendix appears normal. Vascular/Lymphatic: No abdominal aorta or iliac aneurysm. Atherosclerotic plaque of the aorta and its branches. No abdominal, pelvic, or inguinal lymphadenopathy. Reproductive: Concern device in grossly appropriate position. Uterus and bilateral adnexa are unremarkable. Other: Trace volume free fluid within the pelvis. No intraperitoneal free gas. No organized fluid collection. Musculoskeletal: No abdominal wall hernia or abnormality. No suspicious lytic or blastic osseous lesions. No acute displaced fracture. Levoscoliosis centered at the L3 level with associated severe multilevel degenerative changes of the spine. IMPRESSION: 1. Sigmoiditis and proctitis. Differential diagnosis for etiology includes infection, inflammation, ischemia. 2. Scattered colonic diverticulosis with no acute diverticulitis. 3. Urinary bladder wall thickening likely due to decompression. Consider correlation with urinalysis to exclude underlying infection. 4. Trace simple free fluid within the pelvis. 5.  Aortic Atherosclerosis (ICD10-I70.0). Electronically Signed   By: Iven Finn M.D.   On: 11/12/2021 17:53    Cardiac Studies  TTE 11/13/2021 1. Left ventricular ejection fraction, by estimation, is 60 to 65%. The   left ventricle has normal function. The left ventricle has no regional  wall motion abnormalities. There is moderate left ventricular hypertrophy.  Left ventricular diastolic  parameters are indeterminate.   2. Right ventricular systolic function is normal. The right ventricular  size is normal. There is normal pulmonary artery systolic pressure.   3. MR is very eccentric Directed anteriorly along wall of LA At least  mild in severity.   4. The aortic valve is tricuspid. Aortic valve regurgitation is mild.  Aortic valve sclerosis is present, with no evidence of aortic valve  stenosis.   5. The inferior vena cava is normal in size with greater than 50%  respiratory variability, suggesting right atrial pressure of 3 mmHg.   Patient Profile  Rita Harding is a 88 y.o. female with a hx of hypertension, prior Takotsubo with nonobstructive coronary disease;  hyperlipidemia who is being seen today for the evaluation of bradycardia at the request of hospital medicine.  Assessment & Plan  2:1 AV block: had prior 1st degree AV block, RBBB and LAFB in 05/2021. Her coreg was decreased. She now presents with sigmoiditis and proctitis, incidentally found to have 2:1. She has felt weak and presented with diarrheal illness yesterday. Challenging to determine if also associated with her rhythm. She denies LH. No syncope. Will plan for a limited TTE and keep her off her coreg. Plan to ambulate her as well. Consulted EP for evaluation of a PPM; with progressive conduction disease may warrant one if not now, than as an outpatient. They plan to see tomorrow. She can eat. Can make NPO at [place]. K>4, Mg>2.  For questions or updates, please contact [facility] Please consult [email or web address] for contact info under        Signed, Janina Mayo, MD  11/13/2021, 9:44 AM

## 2021-11-13 NOTE — TOC Initial Note (Incomplete)
Transition of Care [facility]) - Initial/Assessment Note    Patient Details  Name: Rita Harding MRN: [id number] Date of Birth: Feb 28, 1934  Transition of Care [facility]) CM/SW Contact:    Durenda Guthrie, RN Phone Number: 11/13/2021, 4:03 PM  Clinical Narrative:                            Patient Goals and CMS Choice        Expected Discharge Plan and Services                                                Prior Living Arrangements/Services                       Activities of Daily Living Home Assistive Devices/Equipment: None ADL Screening (condition at time of admission) Patient's cognitive ability adequate to safely complete daily activities?: Yes Is the patient deaf or have difficulty hearing?: No Does the patient have difficulty seeing, even when wearing glasses/contacts?: No Does the patient have difficulty concentrating, remembering, or making decisions?: Yes Patient able to express need for assistance with ADLs?: No Does the patient have difficulty dressing or bathing?: No Independently performs ADLs?: Yes (appropriate for developmental age) Does the patient have difficulty walking or climbing stairs?: Yes Weakness of Legs: None Weakness of Arms/Hands: None  Permission Sought/Granted                  Emotional Assessment              Admission diagnosis:  Bradycardia [R00.1] Proctitis [K62.89] Sinus bradycardia [R00.1] Elevated troponin [R77.8] Sigmoiditis [K52.9] Acute cystitis with hematuria [N30.01] Abdominal pain, unspecified abdominal location [R10.9] Diarrhea, unspecified type [R19.7] Patient Active Problem List   Diagnosis Date Noted   Sinus bradycardia 11/12/2021   Senile calcific aortic valve sclerosis 09/12/2021   Bradycardia 06/06/2021   Encounter for general adult medical examination with abnormal findings 01/14/2021   Hyperglycemia 01/09/2021   Dietary folate deficiency 01/09/2021   Hyperlipidemia with  target LDL less than 100 01/09/2021   Coronary artery disease, non-occlusive    Essential hypertension    Takotsubo cardiomyopathy; resolved    PCP:  Etta Grandchild, MD Pharmacy:   [id number] [place], [place] - [address] [place] [address] Phone: [phone] Fax: [phone]  [facility] [address] [place] ([place]), [place] - [address] [place] [address] Phone: [phone] Fax: [phone]  [facility] [address] [place], [place] - [address] [place] [address] Phone: [phone] Fax: [phone]  [facility] [address] [place] [address] Phone: [phone] Fax: [phone]     Social Determinants of Health (SDOH) Interventions    Readmission Risk Interventions     View : No data to display.

## 2021-11-14 ENCOUNTER — Inpatient Hospital Stay (HOSPITAL_COMMUNITY)
Admission: EM | Disposition: A | Discharge status: Home / Self Care | Payer: Self-pay | Source: Home / Self Care | Attending: Family Medicine

## 2021-11-14 DIAGNOSIS — R001 Bradycardia, unspecified: Secondary | ICD-10-CM | POA: Diagnosis present

## 2021-11-14 DIAGNOSIS — I160 Hypertensive urgency: Secondary | ICD-10-CM

## 2021-11-14 DIAGNOSIS — I459 Conduction disorder, unspecified: Secondary | ICD-10-CM | POA: Diagnosis present

## 2021-11-14 DIAGNOSIS — I441 Atrioventricular block, second degree: Secondary | ICD-10-CM | POA: Diagnosis present

## 2021-11-14 DIAGNOSIS — K6289 Other specified diseases of anus and rectum: Secondary | ICD-10-CM | POA: Diagnosis present

## 2021-11-14 DIAGNOSIS — R32 Unspecified urinary incontinence: Secondary | ICD-10-CM | POA: Diagnosis present

## 2021-11-14 DIAGNOSIS — I428 Other cardiomyopathies: Secondary | ICD-10-CM | POA: Diagnosis present

## 2021-11-14 DIAGNOSIS — E785 Hyperlipidemia, unspecified: Secondary | ICD-10-CM | POA: Diagnosis present

## 2021-11-14 DIAGNOSIS — Z87891 Personal history of nicotine dependence: Secondary | ICD-10-CM | POA: Diagnosis not present

## 2021-11-14 DIAGNOSIS — I252 Old myocardial infarction: Secondary | ICD-10-CM | POA: Diagnosis not present

## 2021-11-14 DIAGNOSIS — Z86718 Personal history of other venous thrombosis and embolism: Secondary | ICD-10-CM | POA: Diagnosis not present

## 2021-11-14 DIAGNOSIS — N179 Acute kidney failure, unspecified: Secondary | ICD-10-CM | POA: Diagnosis present

## 2021-11-14 DIAGNOSIS — I5030 Unspecified diastolic (congestive) heart failure: Secondary | ICD-10-CM | POA: Diagnosis present

## 2021-11-14 DIAGNOSIS — I110 Hypertensive heart disease with heart failure: Secondary | ICD-10-CM | POA: Diagnosis present

## 2021-11-14 DIAGNOSIS — E861 Hypovolemia: Secondary | ICD-10-CM | POA: Diagnosis present

## 2021-11-14 DIAGNOSIS — I2510 Atherosclerotic heart disease of native coronary artery without angina pectoris: Secondary | ICD-10-CM | POA: Diagnosis present

## 2021-11-14 DIAGNOSIS — Z8249 Family history of ischemic heart disease and other diseases of the circulatory system: Secondary | ICD-10-CM | POA: Diagnosis not present

## 2021-11-14 DIAGNOSIS — Z7982 Long term (current) use of aspirin: Secondary | ICD-10-CM | POA: Diagnosis not present

## 2021-11-14 DIAGNOSIS — Z79899 Other long term (current) drug therapy: Secondary | ICD-10-CM | POA: Diagnosis not present

## 2021-11-14 DIAGNOSIS — I5181 Takotsubo syndrome: Secondary | ICD-10-CM | POA: Diagnosis present

## 2021-11-14 DIAGNOSIS — R739 Hyperglycemia, unspecified: Secondary | ICD-10-CM | POA: Diagnosis present

## 2021-11-14 DIAGNOSIS — Z809 Family history of malignant neoplasm, unspecified: Secondary | ICD-10-CM | POA: Diagnosis not present

## 2021-11-14 HISTORY — PX: PACEMAKER IMPLANT: EP1218

## 2021-11-14 LAB — GLUCOSE, CAPILLARY
Glucose-Capillary: 114 mg/dL — ABNORMAL HIGH (ref 70–99)
Glucose-Capillary: 93 mg/dL (ref 70–99)
Glucose-Capillary: 98 mg/dL (ref 70–99)

## 2021-11-14 LAB — URINE CULTURE

## 2021-11-14 LAB — SURGICAL PCR SCREEN
MRSA, PCR: NEGATIVE
Staphylococcus aureus: POSITIVE — AB

## 2021-11-14 SURGERY — PACEMAKER IMPLANT

## 2021-11-14 MED ORDER — YOU HAVE A PACEMAKER BOOK
Freq: Once | Status: AC
  Filled 2021-11-14: qty 1

## 2021-11-14 MED ORDER — LIDOCAINE HCL (PF) 1 % IJ SOLN
INTRAMUSCULAR | Status: DC | PRN
  Administered 2021-11-14: 60 mL

## 2021-11-14 MED ORDER — SODIUM CHLORIDE 0.9 % IV SOLN
250.0000 mL | INTRAVENOUS | Status: DC
  Administered 2021-11-14: 250 mL via INTRAVENOUS

## 2021-11-14 MED ORDER — IOHEXOL 350 MG/ML SOLN
INTRAVENOUS | Status: DC | PRN
  Administered 2021-11-14: 15 mL

## 2021-11-14 MED ORDER — HEPARIN (PORCINE) IN NACL 1000-0.9 UT/500ML-% IV SOLN
INTRAVENOUS | Status: AC
Start: 2021-11-14 — End: ?
  Filled 2021-11-14: qty 500

## 2021-11-14 MED ORDER — SODIUM CHLORIDE 0.9% FLUSH
3.0000 mL | Freq: Two times a day (BID) | INTRAVENOUS | Status: DC
  Administered 2021-11-15: 3 mL via INTRAVENOUS

## 2021-11-14 MED ORDER — HEPARIN (PORCINE) IN NACL 2-0.9 UNITS/ML
INTRAMUSCULAR | Status: AC | PRN
  Administered 2021-11-14: 500 mL

## 2021-11-14 MED ORDER — CEFAZOLIN SODIUM-DEXTROSE 1-4 GM/50ML-% IV SOLN
1.0000 g | Freq: Four times a day (QID) | INTRAVENOUS | Status: DC
  Administered 2021-11-14 – 2021-11-15 (×2): 1 g via INTRAVENOUS
  Filled 2021-11-14 (×3): qty 50

## 2021-11-14 MED ORDER — SODIUM CHLORIDE 0.9 % IV SOLN: INTRAVENOUS | Status: DC

## 2021-11-14 MED ORDER — ACETAMINOPHEN 325 MG PO TABS
325.0000 mg | ORAL_TABLET | ORAL | Status: DC | PRN
  Administered 2021-11-14 – 2021-11-15 (×2): 650 mg via ORAL
  Filled 2021-11-14 (×2): qty 2

## 2021-11-14 MED ORDER — HYDRALAZINE HCL 20 MG/ML IJ SOLN
5.0000 mg | Freq: Once | INTRAMUSCULAR | Status: AC
  Administered 2021-11-14: 5 mg via INTRAVENOUS
  Filled 2021-11-14: qty 1

## 2021-11-14 MED ORDER — SODIUM CHLORIDE 0.9 % IV SOLN
80.0000 mg | INTRAVENOUS | Status: AC
  Administered 2021-11-14: 80 mg
  Filled 2021-11-14: qty 2

## 2021-11-14 MED ORDER — CEFAZOLIN SODIUM-DEXTROSE 2-4 GM/100ML-% IV SOLN
2.0000 g | INTRAVENOUS | Status: AC
  Administered 2021-11-14: 2 g via INTRAVENOUS
  Filled 2021-11-14: qty 100

## 2021-11-14 MED ORDER — CEFAZOLIN SODIUM-DEXTROSE 2-4 GM/100ML-% IV SOLN
INTRAVENOUS | Status: AC
  Filled 2021-11-14: qty 100

## 2021-11-14 MED ORDER — LIDOCAINE HCL (PF) 1 % IJ SOLN
INTRAMUSCULAR | Status: AC
  Filled 2021-11-14: qty 30

## 2021-11-14 MED ORDER — SODIUM CHLORIDE 0.9 % IV SOLN
INTRAVENOUS | Status: AC
  Filled 2021-11-14: qty 2

## 2021-11-14 MED ORDER — MUPIROCIN 2 % EX OINT
1.0000 "application " | TOPICAL_OINTMENT | Freq: Two times a day (BID) | CUTANEOUS | Status: DC
  Administered 2021-11-14 – 2021-11-15 (×3): 1 via NASAL
  Filled 2021-11-14: qty 22

## 2021-11-14 MED ORDER — SODIUM CHLORIDE 0.9% FLUSH: 3.0000 mL | INTRAVENOUS | Status: DC | PRN

## 2021-11-14 MED ORDER — ONDANSETRON HCL 4 MG/2ML IJ SOLN: 4.0000 mg | Freq: Four times a day (QID) | INTRAMUSCULAR | Status: DC | PRN

## 2021-11-14 MED ORDER — ENSURE ENLIVE PO LIQD
237.0000 mL | Freq: Two times a day (BID) | ORAL | Status: DC
  Administered 2021-11-15: 237 mL via ORAL

## 2021-11-14 MED ORDER — CHLORHEXIDINE GLUCONATE CLOTH 2 % EX PADS
6.0000 | MEDICATED_PAD | Freq: Every day | CUTANEOUS | Status: DC
  Administered 2021-11-15: 6 via TOPICAL

## 2021-11-14 MED ORDER — HYDRALAZINE HCL 20 MG/ML IJ SOLN
INTRAMUSCULAR | Status: AC
  Filled 2021-11-14: qty 1

## 2021-11-14 MED ORDER — LIDOCAINE HCL 1 % IJ SOLN
INTRAMUSCULAR | Status: AC
  Filled 2021-11-14: qty 20

## 2021-11-14 MED ORDER — HYDRALAZINE HCL 20 MG/ML IJ SOLN
INTRAMUSCULAR | Status: DC | PRN
  Administered 2021-11-14 (×2): 5 mg via INTRAVENOUS

## 2021-11-14 SURGICAL SUPPLY — 15 items
CABLE SURGICAL S-101-97-12 (CABLE) ×3 IMPLANT
CATH SELECT PACE 669183 (CATHETERS) ×1 IMPLANT
CUTTER LV DELIVERY CATHETER 7 (MISCELLANEOUS) ×1 IMPLANT
KIT MICROPUNCTURE NIT STIFF (SHEATH) ×1 IMPLANT
LEAD INGEVITY 7840 45 (Lead) ×1 IMPLANT
LEAD INGEVITY 7841 52 (Lead) ×1 IMPLANT
LEAD INGEVITY 7842 59 (Lead) ×1 IMPLANT
PACEMAKER VISIONIST IS-1 U225 (Pacemaker) ×1 IMPLANT
PAD DEFIB RADIO PHYSIO CONN (PAD) ×3 IMPLANT
SHEATH 7FR PRELUDE SNAP 13 (SHEATH) ×1 IMPLANT
SHEATH 8FR PRELUDE SNAP 13 (SHEATH) ×1 IMPLANT
SHEATH CLASSIC 7F (SHEATH) ×1 IMPLANT
SHEATH PROBE COVER 6X72 (BAG) ×1 IMPLANT
TRAY PACEMAKER INSERTION (PACKS) ×3 IMPLANT
WIRE HI TORQ VERSACORE-J 145CM (WIRE) ×1 IMPLANT

## 2021-11-14 NOTE — Care Management (Signed)
  Transition of Care [facility]) Screening Note   Patient Details  Name: SAVON COBBS Date of Birth: [birth year]   Transition of Care [facility]) CM/SW Contact:    Gala Lewandowsky, RN Phone Number: 11/14/2021, 12:43 PM    Transition of Care Department [facility]) has reviewed the patient and no TOC needs have been identified at this time. We will continue to monitor patient advancement through interdisciplinary progression rounds. If new patient transition needs arise, please place a TOC consult.

## 2021-11-14 NOTE — Progress Notes (Signed)
Progress Note  Patient: Rita Harding ZOX:[id number] DOB: January 06, 1934  DOA: 11/12/2021  DOS: 11/14/2021    Brief hospital course: Rita Harding is a 88 y.o. female with medical history significant for coronary artery disease, takotsubo cardiomyopathy, nonischemic cardiomyopathy, sinus bradycardia, hypertension, hyperlipidemia, history of left lower extremity DVT completed coumadin 2012.   Patient presents with 24 hours of watery diarrhea abdominal pain nausea and vomiting.  In the ED patient was noted to be bradycardic into the 30s.  Hospitalist called for admission and cardiology/EP called for consult.  Assessment and Plan: Suspected self-limited infectious sigmoiditis/proctitis: Resolved. - Supportive measures, ok to take oral meds.   Abnormal UA, UTI ruled out - No abx.    Sinus bradycardia, symptomatic 2nd degree AV block:  - EP planning PPM 11/14/2021.  - Continue telemetry, avoid AV nodal agents (discontinue home coreg)   AKI, suspect prerenal azotemia in the setting of diarrhea - Improved.   Hyperlipidemia Continue Zetia and home Crestor.   Essential hypertension BP is currently at goal Holding carvedilol and benazepril given above   Hyperglycemia,  A1c 5.8, continue to follow outpatient no indication for insulin or intervention at this time   HFpEF 60 to 65% Hypovolemic on exam IV fluid ongoing until p.o. intake appropriate  Subjective: No complaints, nervous about pacemaker. No chest pain.   Objective: Vitals:   11/14/21 0034 11/14/21 0451 11/14/21 0620 11/14/21 0748  BP: (!) 175/57 (!) 209/71 (!) 163/58 (!) 180/47  Pulse: (!) 35 (!) 48  (!) 35  Resp: Temp: 97.6 F (36.4 C) 98.2 F (36.8 C)  97.6 F (36.4 C)  TempSrc: Oral Oral  Oral  SpO2: 96%   96%  Weight:      Height:       Gen: Pleasant, lively 88 y.o. female in no distress Pulm: Nonlabored breathing room air. Clear CV: Irregular bradycardia. No murmur, rub, or gallop. No JVD, no  dependent edema. GI: Abdomen soft, non-tender, non-distended, with normoactive bowel sounds.  Ext: Warm, no deformities Skin: No rashes, lesions or ulcers on visualized skin. Neuro: Alert and oriented. No focal neurological deficits. Psych: Judgement and insight appear fair. Mood euthymic & affect congruent. Behavior is appropriate.    Data Personally reviewed: CBC: Recent Labs  Lab 11/12/21 1404 11/13/21 0341  WBC 14.7* 6.7  NEUTROABS  --  5.3  HGB 14.8 11.9*  HCT 43.7 34.5*  MCV 92.8 92.7  PLT 263 214   Basic Metabolic Panel: Recent Labs  Lab 11/12/21 1404 11/13/21 0341  NA 140 136  K 4.6 3.9  CL 102 107  CO2 26 24  GLUCOSE 157* 107*  BUN 19 18  CREATININE 1.08* 0.96  CALCIUM 10.9* 8.9  MG 2.0 1.9  PHOS  --  3.2   GFR: Estimated Creatinine Clearance: 33.3 mL/min (by C-G formula based on SCr of 0.96 mg/dL). Liver Function Tests: Recent Labs  Lab 11/12/21 1404 11/13/21 0341  AST 18 15  ALT 15 12  ALKPHOS 73 68  BILITOT 0.8 0.7  PROT 8.1 6.0*  ALBUMIN 4.5 3.1*   Recent Labs  Lab 11/12/21 1404  LIPASE 14   No results for input(s): AMMONIA in the last 168 hours. Coagulation Profile: No results for input(s): INR, PROTIME in the last 168 hours. Cardiac Enzymes: No results for input(s): CKTOTAL, CKMB, CKMBINDEX, TROPONINI in the last 168 hours. BNP (last 3 results) No results for input(s): PROBNP in the last 8760 hours. HbA1C: Recent Labs  11/13/21 0341  HGBA1C 5.8*   CBG: Recent Labs  Lab 11/13/21 1127 11/13/21 1545 11/13/21 2137 11/14/21 0746 11/14/21 1218  GLUCAP 112* 115* 127* 114* 93   Lipid Profile: No results for input(s): CHOL, HDL, LDLCALC, TRIG, CHOLHDL, LDLDIRECT in the last 72 hours. Thyroid Function Tests: Recent Labs    11/13/21 0341  TSH 2.788   Anemia Panel: No results for input(s): VITAMINB12, FOLATE, FERRITIN, TIBC, IRON, RETICCTPCT in the last 72 hours. Urine analysis:    Component Value Date/Time   COLORURINE  YELLOW 11/12/2021 1556   APPEARANCEUR CLOUDY (A) 11/12/2021 1556   LABSPEC 1.023 11/12/2021 1556   PHURINE 5.5 11/12/2021 1556   GLUCOSEU NEGATIVE 11/12/2021 1556   HGBUR SMALL (A) 11/12/2021 1556   BILIRUBINUR SMALL (A) 11/12/2021 1556   KETONESUR TRACE (A) 11/12/2021 1556   PROTEINUR >300 (A) 11/12/2021 1556   UROBILINOGEN 0.2 04/13/2013 2246   NITRITE NEGATIVE 11/12/2021 1556   LEUKOCYTESUR LARGE (A) 11/12/2021 1556   Recent Results (from the past 240 hour(s))  Urine Culture     Status: Abnormal   Collection Time: 11/12/21 10:08 PM   Specimen: Urine, Clean Catch  Result Value Ref Range Status   Specimen Description URINE, CLEAN CATCH  Final   Special Requests   Final    NONE Performed at [facility], [address] [place], [place] [address]    Culture MULTIPLE SPECIES PRESENT, SUGGEST RECOLLECTION (A)  Final   Report Status 11/14/2021 FINAL  Final  Surgical PCR screen     Status: Abnormal   Collection Time: 11/14/21  9:45 AM   Specimen: Nasal Mucosa; Nasal Swab  Result Value Ref Range Status   MRSA, PCR NEGATIVE NEGATIVE Final   Staphylococcus aureus POSITIVE (A) NEGATIVE Final    Comment: (NOTE) The Xpert SA Assay (FDA approved for NASAL specimens in patients 86 years of age and older), is one component of a comprehensive surveillance program. It is not intended to diagnose infection nor to guide or monitor treatment. Performed at [facility], [address], [place], [place] [address]      CT Abdomen Pelvis W Contrast  Result Date: 11/12/2021 CLINICAL DATA:  Abdominal pain, acute, nonlocalized diarrhea EXAM: CT ABDOMEN AND PELVIS WITH CONTRAST TECHNIQUE: Multidetector CT imaging of the abdomen and pelvis was performed using the standard protocol following bolus administration of intravenous contrast. RADIATION DOSE REDUCTION: This exam was performed according to the departmental dose-optimization program which includes automated exposure control,  adjustment of the mA and/or kV according to patient size and/or use of iterative reconstruction technique. CONTRAST:  31mL OMNIPAQUE IOHEXOL 300 MG/ML  SOLN COMPARISON:  None Available. FINDINGS: Lower chest: No acute abnormality.  Tiny hiatal hernia. Hepatobiliary: No focal liver abnormality. No gallstones, gallbladder wall thickening, or pericholecystic fluid. No biliary dilatation. Pancreas: No focal lesion. Normal pancreatic contour. No surrounding inflammatory changes. No main pancreatic ductal dilatation. Spleen: Normal in size without focal abnormality. Adrenals/Urinary Tract: No adrenal nodule bilaterally. Bilateral kidneys enhance symmetrically. No hydronephrosis. No hydroureter. Urinary bladder wall thickening likely due to decompression. Otherwise unremarkable. Stomach/Bowel: Stomach is within normal limits. No evidence of small bowel wall thickening or dilatation. Diffuse bowel thickening and mucosal hyperemia of the sigmoid colon and rectum. Scattered colonic diverticulosis. Appendix appears normal. Vascular/Lymphatic: No abdominal aorta or iliac aneurysm. Atherosclerotic plaque of the aorta and its branches. No abdominal, pelvic, or inguinal lymphadenopathy. Reproductive: Concern device in grossly appropriate position. Uterus and bilateral adnexa are unremarkable. Other: Trace volume  free fluid within the pelvis. No intraperitoneal free gas. No organized fluid collection. Musculoskeletal: No abdominal wall hernia or abnormality. No suspicious lytic or blastic osseous lesions. No acute displaced fracture. Levoscoliosis centered at the L3 level with associated severe multilevel degenerative changes of the spine. IMPRESSION: 1. Sigmoiditis and proctitis. Differential diagnosis for etiology includes infection, inflammation, ischemia. 2. Scattered colonic diverticulosis with no acute diverticulitis. 3. Urinary bladder wall thickening likely due to decompression. Consider correlation with urinalysis to  exclude underlying infection. 4. Trace simple free fluid within the pelvis. 5.  Aortic Atherosclerosis (ICD10-I70.0). Electronically Signed   By: Tish Frederickson M.D.   On: 11/12/2021 17:53   EP PPM/ICD IMPLANT  Result Date: 11/14/2021 SURGEON:  Will Jorja Loa, MD   PREPROCEDURE DIAGNOSIS: Second-degree AV block   POSTPROCEDURE DIAGNOSIS: Second-degree AV block    PROCEDURES:  1. Left upper extremity venography.  2. Pacemaker implantation.   INTRODUCTION: JAHLISA ROSSITTO is a 88 y.o. female  with a history of bradycardia who presents today for pacemaker implantation.  The patient presented to the hospital with 2 1 AV block.  No reversible causes have been identified.  The patient therefore presents today for pacemaker implantation.   DESCRIPTION OF PROCEDURE:  Informed written consent was obtained, and  the patient was brought to the electrophysiology lab in a fasting state.  The patient required no sedation for the procedure today.  The patients left chest was prepped and draped in the usual sterile fashion by the EP lab staff. The skin overlying the left deltopectoral region was infiltrated with lidocaine for local analgesia.  A 4-cm incision was made over the left deltopectoral region.  A left subcutaneous pacemaker pocket was fashioned using a combination of sharp and blunt dissection. Electrocautery was required to assure hemostasis.  Left Upper Extremity Venography: A venogram of the left upper extremity was performed, which revealed a large left cephalic vein, which emptied into a large left subclavian vein.  The left axillary vein was moderate in size.  RA/RV Lead Placement: The left axillary vein was cannulated.  Through the left axillary vein, a [employer] [id number] (serial number [id number]) right atrial lead and a [employer] [id number] (serial number [id number]) right ventricular lead were advanced with fluoroscopic visualization into the right atrial appendage and right  ventricular apex positions respectively.  Initial atrial lead P- waves measured 2.8 mV with impedance of 571 ohms and a threshold of 0.7 V at 0.5 msec.  Right ventricular lead R-waves measured 17.5 mV with an impedance of 895 ohms and a threshold of 0.8 V at 0.5 msec.  Both leads were secured to the pectoralis fascia using #2-0 silk over the suture sleeves. Prior to implantation of the right ventricular lead, a [employer] [phone] (serial [id number]) was advanced into the right ventricle.  The lead was placed into the left bundle position.  Using forward pressure and clockwise rotation, the lead was screwed into the right ventricular septum.  The lead did not capture the left bundle on the initial placement, and the lead was attempted to be removed from the body.  With counterclockwise rotation of the lead, the screw length and inset into the tissue.  Further rotation of the lead did not release the lead from the tissue.  An attempt was made at retracting the screw, but the screw remained extended.  Under fluoroscopy, it was obvious that the previous event.  Gentle pressure was put on the lead for approximately 20 minutes, but  the lead did not retract from the body.  Due to that, the left bundle sheath was removed and the lead was tied down to the pectoralis fascia using 2-0 silk over suture sleeves.  Device Placement:  The leads were then connected to a [employer] CRT-P (serial number [id number]) pacemaker.  The leads were connected to a CRT-P to ensure MRI compatibility.  The pocket was irrigated with copious gentamicin solution.  The pacemaker was then placed into the pocket.  The pocket was then closed in 3 layers with 2.0 Vicryl suture for the subcutaneous and 3.0 Vicryl suture subcuticular layers.  Steri-Strips and a sterile dressing were then applied. EBL<47ml. There were no early apparent complications.   CONCLUSIONS:  1. Successful implantation of a [employer]onic Azure XT DR MRI SureScan  dual-chamber pacemaker for symptomatic bradycardia  2. No early apparent complications.       Will Jorja Loa, MD 11/14/2021 4:41 PM   ECHOCARDIOGRAM LIMITED  Result Date: 11/13/2021    ECHOCARDIOGRAM LIMITED REPORT   Patient Name:   HAJRA PORT Date of Exam: 11/13/2021 Medical Rec #:  [id number]         Height:       62.0 in Accession #:    [id number]        Weight:       124.2 lb Date of Birth:  Jun 27, 1933         BSA:          1.561 m Patient Age:    88 years          BP:           155/77 mmHg Patient Gender: F                 HR:           34 bpm. Exam Location:  Inpatient Procedure: Limited Echo Indications:    R94.31 Abnormal EKG  History:        Patient has prior history of Echocardiogram examinations, most                 recent 10/01/2021. CAD; Risk Factors:Hypertension and                 Dyslipidemia.  Sonographer:    Irving Burton Senior RDCS Referring Phys: [phone] [facility]  Sonographer Comments: Limited to re-evaluate LV EF IMPRESSIONS  1. Limited study for LVEF. EF 60%, unchanged from prior.  2. Left ventricular ejection fraction, by estimation, is 55 to 60%. The left ventricle has normal function. The left ventricle has no regional wall motion abnormalities.  3. Right ventricular systolic function is normal. The right ventricular size is normal.  4. The mitral valve is grossly normal.  5. The inferior vena cava is normal in size with greater than 50% respiratory variability, suggesting right atrial pressure of 3 mmHg. FINDINGS  Left Ventricle: Left ventricular ejection fraction, by estimation, is 55 to 60%. The left ventricle has normal function. The left ventricle has no regional wall motion abnormalities. Right Ventricle: The right ventricular size is normal. No increase in right ventricular wall thickness. Right ventricular systolic function is normal. Pericardium: There is no evidence of pericardial effusion. Mitral Valve: The mitral valve is grossly normal. Tricuspid Valve: The tricuspid  valve is grossly normal. Aorta: The aortic root is normal in size and structure. Venous: The inferior vena cava is normal in size with greater than 50% respiratory variability, suggesting right atrial pressure of 3  mmHg. Lennie Odor MD Electronically signed by Lennie Odor MD Signature Date/Time: 11/13/2021/12:19:35 PM    Final     Family Communication: None at bedside  Disposition: Status is: Inpatient Remains inpatient appropriate because: PPM implantation and monitoring. Planned Discharge Destination: Home      Tyrone Nine, MD 11/14/2021 5:40 PM Page by [email or web address]

## 2021-11-14 NOTE — Progress Notes (Addendum)
 Progress Note  Patient Name: Rita Harding Date of Encounter: 11/14/2021  [facility] Cardiologist: David Harding, MD   Subjective   Feels "fine!" No ongoing belly discomfort/cramping, no V/D  Inpatient Medications    Scheduled Meds:  aspirin EC  81 mg Oral Daily   enoxaparin (LOVENOX) injection  30 mg Subcutaneous Q24H   ezetimibe  10 mg Oral Daily   folic acid  1 mg Oral Daily   insulin aspart  0-5 Units Subcutaneous QHS   insulin aspart  0-9 Units Subcutaneous TID WC   rosuvastatin  20 mg Oral Daily   sodium chloride flush  3 mL Intravenous Q12H   Continuous Infusions:  lactated ringers 50 mL/hr at 11/14/21 0030   PRN Meds: acetaminophen, HYDROmorphone (DILAUDID) injection, melatonin, ondansetron (ZOFRAN) IV, oxyCODONE, polyethylene glycol   Vital Signs    Vitals:   11/14/21 0034 11/14/21 0451 11/14/21 0620 11/14/21 0748  BP: (!) 175/57 (!) 209/71 (!) 163/58 (!) 180/47  Pulse: (!) 35 (!) 48  (!) 35  Resp: 14 16  16  Temp: 97.6 F (36.4 C) 98.2 F (36.8 C)  97.6 F (36.4 C)  TempSrc: Oral Oral  Oral  SpO2: 96%   96%  Weight:      Height:        Intake/Output Summary (Last 24 hours) at 11/14/2021 0805 Last data filed at 11/14/2021 0600 Gross per 24 hour  Intake 1680.37 ml  Output --  Net 1680.37 ml      11/12/2021    9:31 PM 11/12/2021    1:51 PM 09/12/2021    1:53 PM  Last 3 Weights  Weight (lbs) 124 lb 3.2 oz 124 lb 134 lb 6.4 oz  Weight (kg) 56.337 kg 56.246 kg 60.963 kg      Telemetry    Unchanged largely 2:1 AVblock, some junctional rhythm and CHB, rates 30's-40 - Personally Reviewed  ECG    No new EKGs - Personally Reviewed  Physical Exam   GEN: No acute distress.   Neck: No JVD Cardiac: RRR, bradycardic, no murmurs, rubs, or gallops.  Respiratory: CTA b/l. GI: Soft, nontender, non-distended  MS: No edema; age appropriate atrophy Neuro:  Nonfocal  Psych: Normal affect   Labs    High Sensitivity Troponin:   Recent Labs  Lab  11/12/21 1404 11/12/21 1730  TROPONINIHS 17 56*     Chemistry Recent Labs  Lab 11/12/21 1404 11/13/21 0341  NA 140 136  K 4.6 3.9  CL 102 107  CO2 26 24  GLUCOSE 157* 107*  BUN 19 18  CREATININE 1.08* 0.96  CALCIUM 10.9* 8.9  MG 2.0 1.9  PROT 8.1 6.0*  ALBUMIN 4.5 3.1*  AST 18 15  ALT 15 12  ALKPHOS 73 68  BILITOT 0.8 0.7  GFRNONAA 50* 58*  ANIONGAP 12 5    Lipids No results for input(s): CHOL, TRIG, HDL, LABVLDL, LDLCALC, CHOLHDL in the last 168 hours.  Hematology Recent Labs  Lab 11/12/21 1404 11/13/21 0341  WBC 14.7* 6.7  RBC 4.71 3.72*  HGB 14.8 11.9*  HCT 43.7 34.5*  MCV 92.8 92.7  MCH 31.4 32.0  MCHC 33.9 34.5  RDW 12.8 13.0  PLT 263 214   Thyroid  Recent Labs  Lab 11/13/21 0341  TSH 2.788    BNP Recent Labs  Lab 11/12/21 1404  BNP 230.3*    DDimer No results for input(s): DDIMER in the last 168 hours.   Radiology    CT Abdomen Pelvis   W Contrast Result Date: 11/12/2021 CLINICAL DATA:  Abdominal pain, acute, nonlocalized diarrhea EXAM: CT ABDOMEN AND PELVIS WITH CONTRAST TECHNIQUE: Multidetector CT imaging of the abdomen and pelvis was performed using the standard protocol following bolus administration of intravenous contrast. RADIATION DOSE REDUCTION: This exam was performed according to the departmental dose-optimization program which includes automated exposure control, adjustment of the mA and/or kV according to patient size and/or use of iterative reconstruction technique. CONTRAST:  75mL OMNIPAQUE IOHEXOL 300 MG/ML  SOLN COMPARISON:  None Available. FINDINGS: Lower chest: No acute abnormality.  Tiny hiatal hernia. Hepatobiliary: No focal liver abnormality. No gallstones, gallbladder wall thickening, or pericholecystic fluid. No biliary dilatation. Pancreas: No focal lesion. Normal pancreatic contour. No surrounding inflammatory changes. No main pancreatic ductal dilatation. Spleen: Normal in size without focal abnormality. Adrenals/Urinary  Tract: No adrenal nodule bilaterally. Bilateral kidneys enhance symmetrically. No hydronephrosis. No hydroureter. Urinary bladder wall thickening likely due to decompression. Otherwise unremarkable. Stomach/Bowel: Stomach is within normal limits. No evidence of small bowel wall thickening or dilatation. Diffuse bowel thickening and mucosal hyperemia of the sigmoid colon and rectum. Scattered colonic diverticulosis. Appendix appears normal. Vascular/Lymphatic: No abdominal aorta or iliac aneurysm. Atherosclerotic plaque of the aorta and its branches. No abdominal, pelvic, or inguinal lymphadenopathy. Reproductive: Concern device in grossly appropriate position. Uterus and bilateral adnexa are unremarkable. Other: Trace volume free fluid within the pelvis. No intraperitoneal free gas. No organized fluid collection. Musculoskeletal: No abdominal wall hernia or abnormality. No suspicious lytic or blastic osseous lesions. No acute displaced fracture. Levoscoliosis centered at the L3 level with associated severe multilevel degenerative changes of the spine. IMPRESSION: 1. Sigmoiditis and proctitis. Differential diagnosis for etiology includes infection, inflammation, ischemia. 2. Scattered colonic diverticulosis with no acute diverticulitis. 3. Urinary bladder wall thickening likely due to decompression. Consider correlation with urinalysis to exclude underlying infection. 4. Trace simple free fluid within the pelvis. 5.  Aortic Atherosclerosis (ICD10-I70.0). Electronically Signed   By: Morgane  Naveau M.D.   On: 11/12/2021 17:53      Cardiac Studies   11/13/21: TTE (limited) 1. Limited study for LVEF. EF 60%, unchanged from prior.   2. Left ventricular ejection fraction, by estimation, is 55 to 60%. The  left ventricle has normal function. The left ventricle has no regional  wall motion abnormalities.   3. Right ventricular systolic function is normal. The right ventricular  size is normal.   4. The mitral  valve is grossly normal.   5. The inferior vena cava is normal in size with greater than 50%  respiratory variability, suggesting right atrial pressure of 3 mmHg   10/01/2021: TTE  1. Left ventricular ejection fraction, by estimation, is 60 to 65%. The  left ventricle has normal function. The left ventricle has no regional  wall motion abnormalities. There is moderate left ventricular hypertrophy.  Left ventricular diastolic  parameters are indeterminate.   2. Right ventricular systolic function is normal. The right ventricular  size is normal. There is normal pulmonary artery systolic pressure.   3. MR is very eccentric Directed anteriorly along wall of LA At least  mild in severity.   4. The aortic valve is tricuspid. Aortic valve regurgitation is mild.  Aortic valve sclerosis is present, with no evidence of aortic valve  stenosis.   5. The inferior vena cava is normal in size with greater than 50%  respiratory variability, suggesting right atrial pressure of 3 mmHg.   Comparison(s): Report only 07/31/10 EF >55%.   Patient Profile       88 y.o. 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 Off coreg here (and at home a couple days prior to coming in) No other reversible causes.  Appreciate IM reaching out, not felt to be actively infected in any way, more likely a transient viral GI illness and antibiotics have been stopped No ongoing symptoms  We Darica Goren pursue PPM today Dr. Graylin Sperling has discussed with the patient again rational for PPM, despite minimal if any symptoms, the implant procedure and potential risks/benefits. She is agreeable  2  HTN Should improve with better HR and resumption of home meds PRN  hydralazine given this AM   For questions or updates, please contact [facility] Please consult [email or web address] for contact info under        Signed, Renee Lynn Ursuy, PA-C  11/14/2021, 8:05 AM    I have seen and examined this patient with Renee Ursuy.  Agree with above, note added to reflect my findings.  Remains in 2-1 block.  No acute symptoms while laying in bed.  GEN: Well nourished, well developed, in no acute distress  HEENT: normal  Neck: no JVD, carotid bruits, or masses Cardiac: Bradycardic, regular; no murmurs, rubs, or gallops,no edema  Respiratory:  clear to auscultation bilaterally, normal work of breathing GI: soft, nontender, nondistended, + BS MS: no deformity or atrophy  Skin: warm and dry Neuro:  Strength and sensation are intact Psych: euthymic mood, full affect   2 to 1 AV block: No rate controlling medications.  Remains in second-degree AV block.  We Lamija Besse plan for pacemaker implant.  Risk and benefits were discussed risk of bleeding, tamponade, infection, pneumothorax, lead dislodgment.  She understand these risks and is agreed to the procedure.  Josue Falconi M. [place] MD 11/14/2021 8:34 AM  

## 2021-11-14 NOTE — Progress Notes (Signed)
Initial Nutrition Assessment  DOCUMENTATION CODES:  Not applicable  INTERVENTION:  Resume regular diet after procedure, encourage PO intake Add Ensure Enlive po BID, each supplement provides 350 kcal and 20 grams of protein. MVI with minerals daily  NUTRITION DIAGNOSIS:  Inadequate oral intake related to altered GI function (diarrhea) as evidenced by per patient/family report.  GOAL:  Patient will meet greater than or equal to 90% of their needs  MONITOR:  PO intake, Supplement acceptance, Diet advancement, I & O's, Labs  REASON FOR ASSESSMENT:  Malnutrition Screening Tool    ASSESSMENT:  Pt with hx of HTN, HLD and CAD presented to ED with diarrhea. While in ED, found to be bradycardic and sent to [facility] for admission  Pt out of room at the time of assessment in the cath lab. Unable to obtain nutrition hx or physical exam.  Reviewed chart and noted 8.8% weight loss x 6 months which is not severe, but concerning due to advanced age. Good intake noted so far this admission. Will add nutrition supplements for weight loss and recent GI distress and follow up on nutrition interview at follow-up.  Average Meal Intake: 6/7: 100% intake x 1 recorded meals  Nutritionally Relevant Medications: Scheduled Meds:  folic acid  1 mg Oral Daily   insulin aspart  0-5 Units Subcutaneous QHS   insulin aspart  0-9 Units Subcutaneous TID WC   rosuvastatin  20 mg Oral Daily   Continuous Infusions:  sodium chloride      ceFAZolin (ANCEF) IV     lactated ringers 50 mL/hr at 11/14/21 0030   PRN Meds: ondansetron, polyethylene glycol  Labs Reviewed  NUTRITION - FOCUSED PHYSICAL EXAM: Defer to follow-up assessment  Diet Order:   Diet Order             Diet NPO time specified Except for: Sips with Meds  Diet effective now                   EDUCATION NEEDS:  No education needs have been identified at this time  Skin:  Skin Assessment: Reviewed RN Assessment  Last BM:   6/7  Height:  Ht Readings from Last 1 Encounters:  11/12/21 5\' 2"  (1.575 m)    Weight:  Wt Readings from Last 1 Encounters:  11/12/21 56.3 kg    Ideal Body Weight:  50 kg  BMI:  Body mass index is 22.72 kg/m.  Estimated Nutritional Needs:  Kcal:  1500-1700 kcal/d Protein:  75-85 g/d Fluid:  1.5-1.8L/d   01/12/22, RD, LDN Clinical Dietitian RD pager # available in [facility]  After hours/weekend pager # available in [facility]

## 2021-11-14 NOTE — H&P (View-Only) (Signed)
Progress Note  Patient Name: Rita Harding Date of Encounter: 11/14/2021  [facility] Cardiologist: Glenetta Hew, MD   Subjective   Feels "fine!" No ongoing belly discomfort/cramping, no V/D  Inpatient Medications    Scheduled Meds:  aspirin EC  81 mg Oral Daily   enoxaparin (LOVENOX) injection  30 mg Subcutaneous Q24H   ezetimibe  10 mg Oral Daily   folic acid  1 mg Oral Daily   insulin aspart  0-5 Units Subcutaneous QHS   insulin aspart  0-9 Units Subcutaneous TID WC   rosuvastatin  20 mg Oral Daily   sodium chloride flush  3 mL Intravenous Q12H   Continuous Infusions:  lactated ringers 50 mL/hr at 11/14/21 0030   PRN Meds: acetaminophen, HYDROmorphone (DILAUDID) injection, melatonin, ondansetron (ZOFRAN) IV, oxyCODONE, polyethylene glycol   Vital Signs    Vitals:   11/14/21 0034 11/14/21 0451 11/14/21 0620 11/14/21 0748  BP: (!) 175/57 (!) 209/71 (!) 163/58 (!) 180/47  Pulse: (!) 35 (!) 48  (!) 35  Resp: 14 16  16   Temp: 97.6 F (36.4 C) 98.2 F (36.8 C)  97.6 F (36.4 C)  TempSrc: Oral Oral  Oral  SpO2: 96%   96%  Weight:      Height:        Intake/Output Summary (Last 24 hours) at 11/14/2021 0805 Last data filed at 11/14/2021 0600 Gross per 24 hour  Intake 1680.37 ml  Output --  Net 1680.37 ml      11/12/2021    9:31 PM 11/12/2021    1:51 PM 09/12/2021    1:53 PM  Last 3 Weights  Weight (lbs) 124 lb 3.2 oz 124 lb 134 lb 6.4 oz  Weight (kg) 56.337 kg 56.246 kg 60.963 kg      Telemetry    Unchanged largely 2:1 AVblock, some junctional rhythm and CHB, rates 30's-40 - Personally Reviewed  ECG    No new EKGs - Personally Reviewed  Physical Exam   GEN: No acute distress.   Neck: No JVD Cardiac: RRR, bradycardic, no murmurs, rubs, or gallops.  Respiratory: CTA b/l. GI: Soft, nontender, non-distended  MS: No edema; age appropriate atrophy Neuro:  Nonfocal  Psych: Normal affect   Labs    High Sensitivity Troponin:   Recent Labs  Lab  11/12/21 1404 11/12/21 1730  TROPONINIHS 17 56*     Chemistry Recent Labs  Lab 11/12/21 1404 11/13/21 0341  NA 140 136  K 4.6 3.9  CL 102 107  CO2 26 24  GLUCOSE 157* 107*  BUN 19 18  CREATININE 1.08* 0.96  CALCIUM 10.9* 8.9  MG 2.0 1.9  PROT 8.1 6.0*  ALBUMIN 4.5 3.1*  AST 18 15  ALT 15 12  ALKPHOS 73 68  BILITOT 0.8 0.7  GFRNONAA 50* 58*  ANIONGAP 12 5    Lipids No results for input(s): CHOL, TRIG, HDL, LABVLDL, LDLCALC, CHOLHDL in the last 168 hours.  Hematology Recent Labs  Lab 11/12/21 1404 11/13/21 0341  WBC 14.7* 6.7  RBC 4.71 3.72*  HGB 14.8 11.9*  HCT 43.7 34.5*  MCV 92.8 92.7  MCH 31.4 32.0  MCHC 33.9 34.5  RDW 12.8 13.0  PLT 263 214   Thyroid  Recent Labs  Lab 11/13/21 0341  TSH 2.788    BNP Recent Labs  Lab 11/12/21 1404  BNP 230.3*    DDimer No results for input(s): DDIMER in the last 168 hours.   Radiology    CT Abdomen Pelvis  W Contrast Result Date: 11/12/2021 CLINICAL DATA:  Abdominal pain, acute, nonlocalized diarrhea EXAM: CT ABDOMEN AND PELVIS WITH CONTRAST TECHNIQUE: Multidetector CT imaging of the abdomen and pelvis was performed using the standard protocol following bolus administration of intravenous contrast. RADIATION DOSE REDUCTION: This exam was performed according to the departmental dose-optimization program which includes automated exposure control, adjustment of the mA and/or kV according to patient size and/or use of iterative reconstruction technique. CONTRAST:  80mL OMNIPAQUE IOHEXOL 300 MG/ML  SOLN COMPARISON:  None Available. FINDINGS: Lower chest: No acute abnormality.  Tiny hiatal hernia. Hepatobiliary: No focal liver abnormality. No gallstones, gallbladder wall thickening, or pericholecystic fluid. No biliary dilatation. Pancreas: No focal lesion. Normal pancreatic contour. No surrounding inflammatory changes. No main pancreatic ductal dilatation. Spleen: Normal in size without focal abnormality. Adrenals/Urinary  Tract: No adrenal nodule bilaterally. Bilateral kidneys enhance symmetrically. No hydronephrosis. No hydroureter. Urinary bladder wall thickening likely due to decompression. Otherwise unremarkable. Stomach/Bowel: Stomach is within normal limits. No evidence of small bowel wall thickening or dilatation. Diffuse bowel thickening and mucosal hyperemia of the sigmoid colon and rectum. Scattered colonic diverticulosis. Appendix appears normal. Vascular/Lymphatic: No abdominal aorta or iliac aneurysm. Atherosclerotic plaque of the aorta and its branches. No abdominal, pelvic, or inguinal lymphadenopathy. Reproductive: Concern device in grossly appropriate position. Uterus and bilateral adnexa are unremarkable. Other: Trace volume free fluid within the pelvis. No intraperitoneal free gas. No organized fluid collection. Musculoskeletal: No abdominal wall hernia or abnormality. No suspicious lytic or blastic osseous lesions. No acute displaced fracture. Levoscoliosis centered at the L3 level with associated severe multilevel degenerative changes of the spine. IMPRESSION: 1. Sigmoiditis and proctitis. Differential diagnosis for etiology includes infection, inflammation, ischemia. 2. Scattered colonic diverticulosis with no acute diverticulitis. 3. Urinary bladder wall thickening likely due to decompression. Consider correlation with urinalysis to exclude underlying infection. 4. Trace simple free fluid within the pelvis. 5.  Aortic Atherosclerosis (ICD10-I70.0). Electronically Signed   By: Iven Finn M.D.   On: 11/12/2021 17:53      Cardiac Studies   11/13/21: TTE (limited) 1. Limited study for LVEF. EF 60%, unchanged from prior.   2. Left ventricular ejection fraction, by estimation, is 55 to 60%. The  left ventricle has normal function. The left ventricle has no regional  wall motion abnormalities.   3. Right ventricular systolic function is normal. The right ventricular  size is normal.   4. The mitral  valve is grossly normal.   5. The inferior vena cava is normal in size with greater than 50%  respiratory variability, suggesting right atrial pressure of 3 mmHg   10/01/2021: TTE  1. Left ventricular ejection fraction, by estimation, is 60 to 65%. The  left ventricle has normal function. The left ventricle has no regional  wall motion abnormalities. There is moderate left ventricular hypertrophy.  Left ventricular diastolic  parameters are indeterminate.   2. Right ventricular systolic function is normal. The right ventricular  size is normal. There is normal pulmonary artery systolic pressure.   3. MR is very eccentric Directed anteriorly along wall of LA At least  mild in severity.   4. The aortic valve is tricuspid. Aortic valve regurgitation is mild.  Aortic valve sclerosis is present, with no evidence of aortic valve  stenosis.   5. The inferior vena cava is normal in size with greater than 50%  respiratory variability, suggesting right atrial pressure of 3 mmHg.   Comparison(s): Report only 07/31/10 EF >55%.   Patient Profile  88 y.o. 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 Off coreg here (and at home a couple days prior to coming in) No other reversible causes.  Appreciate IM reaching out, not felt to be actively infected in any way, more likely a transient viral GI illness and antibiotics have been stopped No ongoing symptoms  We Nautia Lem pursue PPM today Dr. Curt Bears has discussed with the patient again rational for PPM, despite minimal if any symptoms, the implant procedure and potential risks/benefits. She is agreeable  2  HTN Should improve with better HR and resumption of home meds PRN  hydralazine given this AM   For questions or updates, please contact [facility] Please consult [email or web address] for contact info under        Signed, Baldwin Jamaica, PA-C  11/14/2021, 8:05 AM    I have seen and examined this patient with Tommye Standard.  Agree with above, note added to reflect my findings.  Remains in 2-1 block.  No acute symptoms while laying in bed.  GEN: Well nourished, well developed, in no acute distress  HEENT: normal  Neck: no JVD, carotid bruits, or masses Cardiac: Bradycardic, regular; no murmurs, rubs, or gallops,no edema  Respiratory:  clear to auscultation bilaterally, normal work of breathing GI: soft, nontender, nondistended, + BS MS: no deformity or atrophy  Skin: warm and dry Neuro:  Strength and sensation are intact Psych: euthymic mood, full affect   2 to 1 AV block: No rate controlling medications.  Remains in second-degree AV block.  We Alexya Mcdaris plan for pacemaker implant.  Risk and benefits were discussed risk of bleeding, tamponade, infection, pneumothorax, lead dislodgment.  She understand these risks and is agreed to the procedure.  Joyclyn Plazola M. Chip Canepa MD 11/14/2021 8:34 AM

## 2021-11-14 NOTE — Progress Notes (Signed)
Pt has severe asymptomatic HTN this morning with SBP >200. Plan to give a dose of hydralazine.

## 2021-11-14 NOTE — Interval H&P Note (Signed)
History and Physical Interval Note:  11/14/2021 8:36 AM  Rita Harding  has presented today for surgery, with the diagnosis of heart block.  The various methods of treatment have been discussed with the patient and family. After consideration of risks, benefits and other options for treatment, the patient has consented to  Procedure(s): PACEMAKER IMPLANT (N/A) as a surgical intervention.  The patient's history has been reviewed, patient examined, no change in status, stable for surgery.  I have reviewed the patient's chart and labs.  Questions were answered to the patient's satisfaction.     Shalaya Swailes [employer]

## 2021-11-14 NOTE — Progress Notes (Signed)
Contacted Dr. Antionette Char about pt's elevated SBP 209/71 new orders received to give pt a dose of IV Hydralazine 5 mg. Will continue to monitor pt BP after intervention.

## 2021-11-15 ENCOUNTER — Inpatient Hospital Stay (HOSPITAL_COMMUNITY): Payer: Medicare Other

## 2021-11-15 ENCOUNTER — Other Ambulatory Visit: Payer: Self-pay | Admitting: Internal Medicine

## 2021-11-15 ENCOUNTER — Encounter (HOSPITAL_COMMUNITY): Payer: Self-pay | Admitting: Cardiology

## 2021-11-15 DIAGNOSIS — E785 Hyperlipidemia, unspecified: Secondary | ICD-10-CM

## 2021-11-15 DIAGNOSIS — R001 Bradycardia, unspecified: Secondary | ICD-10-CM

## 2021-11-15 LAB — BASIC METABOLIC PANEL
Anion gap: 9 (ref 5–15)
BUN: 10 mg/dL (ref 8–23)
CO2: 22 mmol/L (ref 22–32)
Calcium: 9.2 mg/dL (ref 8.9–10.3)
Chloride: 105 mmol/L (ref 98–111)
Creatinine, Ser: 0.64 mg/dL (ref 0.44–1.00)
GFR, Estimated: >60 mL/min (ref >60)
Glucose, Bld: 97 mg/dL (ref 70–99)
Potassium: 3.5 mmol/L (ref 3.5–5.1)
Sodium: 136 mmol/L (ref 135–145)

## 2021-11-15 LAB — CBC
HCT: 35.4 % — ABNORMAL LOW (ref 36.0–46.0)
Hemoglobin: 11.9 g/dL — ABNORMAL LOW (ref 12.0–15.0)
MCH: 31.6 pg (ref 26.0–34.0)
MCHC: 33.6 g/dL (ref 30.0–36.0)
MCV: 94.1 fL (ref 80.0–100.0)
Platelets: 187 10*3/uL (ref 150–400)
RBC: 3.76 MIL/uL — ABNORMAL LOW (ref 3.87–5.11)
RDW: 13 % (ref 11.5–15.5)
WBC: 5.1 10*3/uL (ref 4.0–10.5)
nRBC: 0 % (ref 0.0–0.2)

## 2021-11-15 LAB — GLUCOSE, CAPILLARY: Glucose-Capillary: 104 mg/dL — ABNORMAL HIGH (ref 70–99)

## 2021-11-15 MED ORDER — CARVEDILOL 6.25 MG PO TABS
6.2500 mg | ORAL_TABLET | Freq: Two times a day (BID) | ORAL | Status: DC
  Administered 2021-11-15: 6.25 mg via ORAL
  Filled 2021-11-15: qty 1

## 2021-11-15 MED ORDER — CARVEDILOL 6.25 MG PO TABS: 6.2500 mg | ORAL_TABLET | Freq: Two times a day (BID) | ORAL | 0 refills | Status: DC

## 2021-11-15 MED FILL — Lidocaine HCl Local Inj 1%: INTRAMUSCULAR | Qty: 20 | Status: AC

## 2021-11-15 NOTE — Progress Notes (Addendum)
Progress Note  Patient Name: Rita Harding Date of Encounter: 11/15/2021  [facility] Cardiologist: Bryan Lemma, MD   Subjective   Feels well, denies any significant post-op discomfort  Inpatient Medications    Scheduled Meds:  aspirin EC  81 mg Oral Daily   carvedilol  6.25 mg Oral BID WC   Chlorhexidine Gluconate Cloth  6 each Topical Q0600   ezetimibe  10 mg Oral Daily   feeding supplement  237 mL Oral BID BM   folic acid  1 mg Oral Daily   insulin aspart  0-5 Units Subcutaneous QHS   insulin aspart  0-9 Units Subcutaneous TID WC   mupirocin ointment  1 application. Nasal BID   rosuvastatin  20 mg Oral Daily   sodium chloride flush  3 mL Intravenous Q12H   Continuous Infusions:   ceFAZolin (ANCEF) IV 1 g (11/15/21 0230)   PRN Meds: acetaminophen, HYDROmorphone (DILAUDID) injection, melatonin, ondansetron (ZOFRAN) IV, oxyCODONE, polyethylene glycol   Vital Signs    Vitals:   11/14/21 2352 11/15/21 0509 11/15/21 0846 11/15/21 0907  BP: 132/67 (!) 160/89 (!) 172/89   Pulse: 66 (!) 58  71  Resp: 16 16 16    Temp: 98.2 F (36.8 C) 98.2 F (36.8 C) 98 F (36.7 C)   TempSrc: Oral Axillary Oral   SpO2: 97% 97% 97%   Weight:      Height:        Intake/Output Summary (Last 24 hours) at 11/15/2021 0932 Last data filed at 11/15/2021 0500 Gross per 24 hour  Intake 947.64 ml  Output --  Net 947.64 ml      11/12/2021    9:31 PM 11/12/2021    1:51 PM 09/12/2021    1:53 PM  Last 3 Weights  Weight (lbs) 124 lb 3.2 oz 124 lb 134 lb 6.4 oz  Weight (kg) 56.337 kg 56.246 kg 60.963 kg      Telemetry    SR/VP - Personally Reviewed  ECG    AV paced paced - Personally Reviewed  Physical Exam   GEN: No acute distress.   Neck: No JVD Cardiac: RRR, bradycardic, no murmurs, rubs, or gallops.  Respiratory: CTA b/l. GI: Soft, nontender, non-distended  MS: No edema; age appropriate atrophy Neuro:  Nonfocal  Psych: Normal affect   PPM site is stable, no hematoma  or bleeding  Labs    High Sensitivity Troponin:   Recent Labs  Lab 11/12/21 1404 11/12/21 1730  TROPONINIHS 17 56*     Chemistry Recent Labs  Lab 11/12/21 1404 11/13/21 0341 11/15/21 0641  NA 140 136 136  K 4.6 3.9 3.5  CL 102 107 105  CO2 26 24 22   GLUCOSE 157* 107* 97  BUN 19 18 10   CREATININE 1.08* 0.96 0.64  CALCIUM 10.9* 8.9 9.2  MG 2.0 1.9  --   PROT 8.1 6.0*  --   ALBUMIN 4.5 3.1*  --   AST 18 15  --   ALT 15 12  --   ALKPHOS 73 68  --   BILITOT 0.8 0.7  --   GFRNONAA 50* 58* >60  ANIONGAP 12 5 9     Lipids No results for input(s): "CHOL", "TRIG", "HDL", "LABVLDL", "LDLCALC", "CHOLHDL" in the last 168 hours.  Hematology Recent Labs  Lab 11/12/21 1404 11/13/21 0341 11/15/21 0641  WBC 14.7* 6.7 5.1  RBC 4.71 3.72* 3.76*  HGB 14.8 11.9* 11.9*  HCT 43.7 34.5* 35.4*  MCV 92.8 92.7 94.1  MCH  31.4 32.0 31.6  MCHC 33.9 34.5 33.6  RDW 12.8 13.0 13.0  PLT 263 214 187   Thyroid  Recent Labs  Lab 11/13/21 0341  TSH 2.788    BNP Recent Labs  Lab 11/12/21 1404  BNP 230.3*    DDimer No results for input(s): "DDIMER" in the last 168 hours.   Radiology    CT Abdomen Pelvis W Contrast Result Date: 11/12/2021 CLINICAL DATA:  Abdominal pain, acute, nonlocalized diarrhea EXAM: CT ABDOMEN AND PELVIS WITH CONTRAST TECHNIQUE: Multidetector CT imaging of the abdomen and pelvis was performed using the standard protocol following bolus administration of intravenous contrast. RADIATION DOSE REDUCTION: This exam was performed according to the departmental dose-optimization program which includes automated exposure control, adjustment of the mA and/or kV according to patient size and/or use of iterative reconstruction technique. CONTRAST:  82mL OMNIPAQUE IOHEXOL 300 MG/ML  SOLN COMPARISON:  None Available. FINDINGS: Lower chest: No acute abnormality.  Tiny hiatal hernia. Hepatobiliary: No focal liver abnormality. No gallstones, gallbladder wall thickening, or  pericholecystic fluid. No biliary dilatation. Pancreas: No focal lesion. Normal pancreatic contour. No surrounding inflammatory changes. No main pancreatic ductal dilatation. Spleen: Normal in size without focal abnormality. Adrenals/Urinary Tract: No adrenal nodule bilaterally. Bilateral kidneys enhance symmetrically. No hydronephrosis. No hydroureter. Urinary bladder wall thickening likely due to decompression. Otherwise unremarkable. Stomach/Bowel: Stomach is within normal limits. No evidence of small bowel wall thickening or dilatation. Diffuse bowel thickening and mucosal hyperemia of the sigmoid colon and rectum. Scattered colonic diverticulosis. Appendix appears normal. Vascular/Lymphatic: No abdominal aorta or iliac aneurysm. Atherosclerotic plaque of the aorta and its branches. No abdominal, pelvic, or inguinal lymphadenopathy. Reproductive: Concern device in grossly appropriate position. Uterus and bilateral adnexa are unremarkable. Other: Trace volume free fluid within the pelvis. No intraperitoneal free gas. No organized fluid collection. Musculoskeletal: No abdominal wall hernia or abnormality. No suspicious lytic or blastic osseous lesions. No acute displaced fracture. Levoscoliosis centered at the L3 level with associated severe multilevel degenerative changes of the spine. IMPRESSION: 1. Sigmoiditis and proctitis. Differential diagnosis for etiology includes infection, inflammation, ischemia. 2. Scattered colonic diverticulosis with no acute diverticulitis. 3. Urinary bladder wall thickening likely due to decompression. Consider correlation with urinalysis to exclude underlying infection. 4. Trace simple free fluid within the pelvis. 5.  Aortic Atherosclerosis (ICD10-I70.0). Electronically Signed   By: Tish Frederickson M.D.   On: 11/12/2021 17:53      Cardiac Studies   11/13/21: TTE (limited) 1. Limited study for LVEF. EF 60%, unchanged from prior.   2. Left ventricular ejection fraction, by  estimation, is 55 to 60%. The  left ventricle has normal function. The left ventricle has no regional  wall motion abnormalities.   3. Right ventricular systolic function is normal. The right ventricular  size is normal.   4. The mitral valve is grossly normal.   5. The inferior vena cava is normal in size with greater than 50%  respiratory variability, suggesting right atrial pressure of 3 mmHg   10/01/2021: TTE  1. Left ventricular ejection fraction, by estimation, is 60 to 65%. The  left ventricle has normal function. The left ventricle has no regional  wall motion abnormalities. There is moderate left ventricular hypertrophy.  Left ventricular diastolic  parameters are indeterminate.   2. Right ventricular systolic function is normal. The right ventricular  size is normal. There is normal pulmonary artery systolic pressure.   3. MR is very eccentric Directed anteriorly along wall of LA At least  mild  in severity.   4. The aortic valve is tricuspid. Aortic valve regurgitation is mild.  Aortic valve sclerosis is present, with no evidence of aortic valve  stenosis.   5. The inferior vena cava is normal in size with greater than 50%  respiratory variability, suggesting right atrial pressure of 3 mmHg.   Comparison(s): Report only 07/31/10 EF >55%.   Patient Profile     88 y.o. 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 Off coreg here (and at home a couple days prior to coming in) No other reversible causes.  Now s/p PPM implant yesterday w/Dr. Elberta Fortis Site is stable Wound care and activity instructions were discussed with the patient this AM  PPM check this AM with stable findings CXR This morning with no  PTX Post implant EP follow up is in place  2  HTN Coreg resumed, ok to titrate upwards as needed Otherwise as per IM service  Dr. Elberta Fortis has seen and examined the patient this morning EP Rita Harding sign off though remain available, please recall if needed   For questions or updates, please contact [facility] Please consult [email or web address] for contact info under        Signed, Sheilah Pigeon, PA-C  11/15/2021, 9:32 AM    I have seen and examined this patient with Francis Dowse.  Agree with above, note added to reflect my findings.  On exam, RRR, no murmurs.  She is now status post Environmental education officer for second-degree AV block.  Device functioning appropriately.  Chest x-ray and interrogation without issue.  Okay for discharge today with follow-up in device clinic.  Rita Harding M. Ayden Hardwick MD 11/15/2021 10:37 AM

## 2021-11-15 NOTE — Discharge Summary (Incomplete)
Physician Discharge Summary   Patient: Rita Harding MRN: [id number] DOB: 1933-08-10  Admit date:     11/12/2021  Discharge date: 11/15/21  Discharge Physician: Tyrone Nine   PCP: Etta Grandchild, MD   Recommendations at discharge:  Follow up with cardiology/EP s/p PPM implantation Follow up with PCP 6/28.  Discharge Diagnoses: Principal Problem:   Sinus bradycardia Active Problems:   Heart block  Hospital Course: Rita Harding is a 88 y.o. female with medical history significant for coronary artery disease, takotsubo cardiomyopathy, nonischemic cardiomyopathy, sinus bradycardia, hypertension, hyperlipidemia, history of left lower extremity DVT completed coumadin 2012.   Patient presents with 24 hours of watery diarrhea abdominal pain nausea and vomiting.  In the ED patient was noted to be bradycardic into the 30s.  Hospitalist called for admission and cardiology/EP called for consult.  Ultimately, she underwent PPM 6/6 with no complications and is stable for discharge.  Assessment and Plan: Self-limited infectious sigmoiditis/proctitis: Resolved. - Supportive measures, ok to take oral meds.    Abnormal UA, UTI ruled out - No abx.    Sinus bradycardia, symptomatic 2nd degree AV block: s/p PPM implant w/Dr. Elberta Fortis. No PTX on follow up CXR, no other complications noted. Site is stable - Wound care and activity instructions were discussed with the patient, follow up arranged.  - PPM check this AM with stable findings - Ok to resume coreg per EP.   AKI, suspect prerenal azotemia in the setting of diarrhea - Improved.   Hyperlipidemia Continue home medications   Essential hypertension BP is currently at goal Holding carvedilol and benazepril given above   Hyperglycemia,  A1c 5.8, continue to follow outpatient no indication for insulin or intervention at this time   HFpEF 60 to 65% Hypovolemic on exam   Consultants: EP Procedures performed: 11/13/2021 Dr.  Elberta Fortis: Successful implantation of a Medtronic Azure XT DR MRI SureScan dual-chamber pacemaker for symptomatic bradycardia Disposition: Home Diet recommendation:  Discharge Diet Orders (From admission, onward)     Start     Ordered   11/15/21 0000  Diet - low sodium heart healthy        11/15/21 1001           Cardiac diet DISCHARGE MEDICATION: Allergies as of 11/15/2021       Reactions   Codeine Nausea And Vomiting   Tetanus Toxoids Other (See Comments)   Swollen and red ( patient informed that she had to take it in three doses)        Medication List     TAKE these medications    aspirin EC 81 MG tablet Take 81 mg by mouth daily.   benazepril 5 MG tablet Commonly known as: LOTENSIN Take 0.5 tablets (2.5 mg total) by mouth daily.   carvedilol 6.25 MG tablet Commonly known as: COREG Take 1 tablet (6.25 mg total) by mouth 2 (two) times daily with a meal. What changed:  medication strength how much to take   ezetimibe 10 MG tablet Commonly known as: ZETIA Take 1 tablet (10 mg total) by mouth daily.   folic acid 1 MG tablet Commonly known as: FOLVITE Take 1 tablet (1 mg total) by mouth daily.   rosuvastatin 20 MG tablet Commonly known as: CRESTOR Take 1 tablet (20 mg total) by mouth daily.        Follow-up Information     Etta Grandchild, MD Follow up on 12/05/2021.   Specialty: Internal Medicine Contact information: [address]  [place] [address] [phone]                Discharge Exam: Filed Weights   11/12/21 1351 11/12/21 2131  Weight: 56.2 kg 56.3 kg  BP (!) 172/89 (BP Location: Right Arm)   Pulse 71   Temp 98 F (36.7 C) (Oral)   Resp 16   Ht  (1.575 m)   Wt 56.3 kg   SpO2 97%   BMI 22.72 kg/m   Dual paced regular rhythm, no MRG, no edema Left upper chest site c/d/I without bleeding or erythema, mildly appropriately tender to palpation without fluctuance CXR personally reviewed without infiltrate or PTX.    Condition at discharge: stable  The results of significant diagnostics from this hospitalization (including imaging, microbiology, ancillary and laboratory) are listed below for reference.   Imaging Studies: DG Chest 2 View  Result Date: 11/15/2021 CLINICAL DATA:  [id number] EXAM: CHEST - 2 VIEW COMPARISON:  06/01/2008 FINDINGS: Three lead pacemaker in place. Mild cardiomegaly. Aortic atherosclerosis and tortuosity. Right lung is clear. There may be mild atelectasis at the left lung base. No visible effusion. Chronic degenerative changes affect the shoulders and spine. IMPRESSION: 1. Possible mild atelectasis at the left lung base. 2. Aortic atherosclerosis. Pacemaker. Electronically Signed   By: Paulina Fusi M.D.   On: 11/15/2021 08:12   EP PPM/ICD IMPLANT  Result Date: 11/14/2021 SURGEON:  Will Jorja Loa, MD   PREPROCEDURE DIAGNOSIS: Second-degree AV block   POSTPROCEDURE DIAGNOSIS: Second-degree AV block    PROCEDURES:  1. Left upper extremity venography.  2. Pacemaker implantation.   INTRODUCTION: Rita Harding is a 88 y.o. female  with a history of bradycardia who presents today for pacemaker implantation.  The patient presented to the hospital with 2 1 AV block.  No reversible causes have been identified.  The patient therefore presents today for pacemaker implantation.   DESCRIPTION OF PROCEDURE:  Informed written consent was obtained, and  the patient was brought to the electrophysiology lab in a fasting state.  The patient required no sedation for the procedure today.  The patients left chest was prepped and draped in the usual sterile fashion by the EP lab staff. The skin overlying the left deltopectoral region was infiltrated with lidocaine for local analgesia.  A 4-cm incision was made over the left deltopectoral region.  A left subcutaneous pacemaker pocket was fashioned using a combination of sharp and blunt dissection. Electrocautery was required to assure hemostasis.  Left Upper  Extremity Venography: A venogram of the left upper extremity was performed, which revealed a large left cephalic vein, which emptied into a large left subclavian vein.  The left axillary vein was moderate in size.  RA/RV Lead Placement: The left axillary vein was cannulated.  Through the left axillary vein, a [employer] [phone] (serial number [id number]) right atrial lead and a [employer] [phone] (serial number [id number]) right ventricular lead were advanced with fluoroscopic visualization into the right atrial appendage and right ventricular apex positions respectively.  Initial atrial lead P- waves measured 2.8 mV with impedance of 571 ohms and a threshold of 0.7 V at 0.5 msec.  Right ventricular lead R-waves measured 17.5 mV with an impedance of 895 ohms and a threshold of 0.8 V at 0.5 msec.  Both leads were secured to the pectoralis fascia using #2-0 silk over the suture sleeves. Prior to implantation of the right ventricular lead, a [employer] [id number]) was advanced into the right  ventricle.  The lead was placed into the left bundle position.  Using forward pressure and clockwise rotation, the lead was screwed into the right ventricular septum.  The lead did not capture the left bundle on the initial placement, and the lead was attempted to be removed from the body.  With counterclockwise rotation of the lead, the screw length and inset into the tissue.  Further rotation of the lead did not release the lead from the tissue.  An attempt was made at retracting the screw, but the screw remained extended.  Under fluoroscopy, it was obvious that the previous event.  Gentle pressure was put on the lead for approximately 20 minutes, but the lead did not retract from the body.  Due to that, the left bundle sheath was removed and the lead was tied down to the pectoralis fascia using 2-0 silk over suture sleeves.  Device Placement:  The leads were then connected to a  [employer] CRT-P (serial number [id number]) pacemaker.  The leads were connected to a CRT-P to ensure MRI compatibility.  The pocket was irrigated with copious gentamicin solution.  The pacemaker was then placed into the pocket.  The pocket was then closed in 3 layers with 2.0 Vicryl suture for the subcutaneous and 3.0 Vicryl suture subcuticular layers.  Steri-Strips and a sterile dressing were then applied. EBL<75ml. There were no early apparent complications.   CONCLUSIONS:  1. Successful implantation of a Medtronic Azure XT DR MRI SureScan dual-chamber pacemaker for symptomatic bradycardia  2. No early apparent complications.       Will Jorja Loa, MD 11/14/2021 4:41 PM   ECHOCARDIOGRAM LIMITED  Result Date: 11/13/2021    ECHOCARDIOGRAM LIMITED REPORT   Patient Name:   Rita Harding Date of Exam: 11/13/2021 Medical Rec #:  [id number]         Height:       62.0 in Accession #:    [id number]        Weight:       124.2 lb Date of Birth:  [birth year]         BSA:          1.561 m Patient Age:    88 years          BP:           155/77 mmHg Patient Gender: F                 HR:           34 bpm. Exam Location:  Inpatient Procedure: Limited Echo Indications:    R94.31 Abnormal EKG  History:        Patient has prior history of Echocardiogram examinations, most                 recent 10/01/2021. CAD; Risk Factors:Hypertension and                 Dyslipidemia.  Sonographer:    Irving Burton Senior RDCS Referring Phys: [phone] [facility] E BRANCH  Sonographer Comments: Limited to re-evaluate LV EF IMPRESSIONS  1. Limited study for LVEF. EF 60%, unchanged from prior.  2. Left ventricular ejection fraction, by estimation, is 55 to 60%. The left ventricle has normal function. The left ventricle has no regional wall motion abnormalities.  3. Right ventricular systolic function is normal. The right ventricular size is normal.  4. The mitral valve is grossly normal.  5. The inferior vena cava is normal in size with greater  than  50% respiratory variability, suggesting right atrial pressure of 3 mmHg. FINDINGS  Left Ventricle: Left ventricular ejection fraction, by estimation, is 55 to 60%. The left ventricle has normal function. The left ventricle has no regional wall motion abnormalities. Right Ventricle: The right ventricular size is normal. No increase in right ventricular wall thickness. Right ventricular systolic function is normal. Pericardium: There is no evidence of pericardial effusion. Mitral Valve: The mitral valve is grossly normal. Tricuspid Valve: The tricuspid valve is grossly normal. Aorta: The aortic root is normal in size and structure. Venous: The inferior vena cava is normal in size with greater than 50% respiratory variability, suggesting right atrial pressure of 3 mmHg. Lennie Odor MD Electronically signed by Lennie Odor MD Signature Date/Time: 11/13/2021/12:19:35 PM    Final    CT Abdomen Pelvis W Contrast  Result Date: 11/12/2021 CLINICAL DATA:  Abdominal pain, acute, nonlocalized diarrhea EXAM: CT ABDOMEN AND PELVIS WITH CONTRAST TECHNIQUE: Multidetector CT imaging of the abdomen and pelvis was performed using the standard protocol following bolus administration of intravenous contrast. RADIATION DOSE REDUCTION: This exam was performed according to the departmental dose-optimization program which includes automated exposure control, adjustment of the mA and/or kV according to patient size and/or use of iterative reconstruction technique. CONTRAST:  78mL OMNIPAQUE IOHEXOL 300 MG/ML  SOLN COMPARISON:  None Available. FINDINGS: Lower chest: No acute abnormality.  Tiny hiatal hernia. Hepatobiliary: No focal liver abnormality. No gallstones, gallbladder wall thickening, or pericholecystic fluid. No biliary dilatation. Pancreas: No focal lesion. Normal pancreatic contour. No surrounding inflammatory changes. No main pancreatic ductal dilatation. Spleen: Normal in size without focal abnormality. Adrenals/Urinary  Tract: No adrenal nodule bilaterally. Bilateral kidneys enhance symmetrically. No hydronephrosis. No hydroureter. Urinary bladder wall thickening likely due to decompression. Otherwise unremarkable. Stomach/Bowel: Stomach is within normal limits. No evidence of small bowel wall thickening or dilatation. Diffuse bowel thickening and mucosal hyperemia of the sigmoid colon and rectum. Scattered colonic diverticulosis. Appendix appears normal. Vascular/Lymphatic: No abdominal aorta or iliac aneurysm. Atherosclerotic plaque of the aorta and its branches. No abdominal, pelvic, or inguinal lymphadenopathy. Reproductive: Concern device in grossly appropriate position. Uterus and bilateral adnexa are unremarkable. Other: Trace volume free fluid within the pelvis. No intraperitoneal free gas. No organized fluid collection. Musculoskeletal: No abdominal wall hernia or abnormality. No suspicious lytic or blastic osseous lesions. No acute displaced fracture. Levoscoliosis centered at the L3 level with associated severe multilevel degenerative changes of the spine. IMPRESSION: 1. Sigmoiditis and proctitis. Differential diagnosis for etiology includes infection, inflammation, ischemia. 2. Scattered colonic diverticulosis with no acute diverticulitis. 3. Urinary bladder wall thickening likely due to decompression. Consider correlation with urinalysis to exclude underlying infection. 4. Trace simple free fluid within the pelvis. 5.  Aortic Atherosclerosis (ICD10-I70.0). Electronically Signed   By: Tish Frederickson M.D.   On: 11/12/2021 17:53    Microbiology: Results for orders placed or performed during the hospital encounter of 11/12/21  Urine Culture     Status: Abnormal   Collection Time: 11/12/21 10:08 PM   Specimen: Urine, Clean Catch  Result Value Ref Range Status   Specimen Description URINE, CLEAN CATCH  Final   Special Requests   Final    NONE Performed at [facility], [address] [place], [place]  [address]    Culture MULTIPLE SPECIES PRESENT, SUGGEST RECOLLECTION (A)  Final   Report Status 11/14/2021 FINAL  Final  Surgical PCR screen     Status: Abnormal   Collection Time: 11/14/21  9:45  AM   Specimen: Nasal Mucosa; Nasal Swab  Result Value Ref Range Status   MRSA, PCR NEGATIVE NEGATIVE Final   Staphylococcus aureus POSITIVE (A) NEGATIVE Final    Comment: (NOTE) The Xpert SA Assay (FDA approved for NASAL specimens in patients 47 years of age and older), is one component of a comprehensive surveillance program. It is not intended to diagnose infection nor to guide or monitor treatment. Performed at [facility], [address] [place], [place] [address]     Labs: CBC: Recent Labs  Lab 11/12/21 1404 11/13/21 0341 11/15/21 0641  WBC 14.7* 6.7 5.1  NEUTROABS  --  5.3  --   HGB 14.8 11.9* 11.9*  HCT 43.7 34.5* 35.4*  MCV 92.8 92.7 94.1  PLT 263 214 187   Basic Metabolic Panel: Recent Labs  Lab 11/12/21 1404 11/13/21 0341 11/15/21 0641  NA 140 136 136  K 4.6 3.9 3.5  CL 102 107 105  CO2 26 24 22   GLUCOSE 157* 107* 97  BUN 19 18 10   CREATININE 1.08* 0.96 0.64  CALCIUM 10.9* 8.9 9.2  MG 2.0 1.9  --   PHOS  --  3.2  --    Liver Function Tests: Recent Labs  Lab 11/12/21 1404 11/13/21 0341  AST 18 15  ALT 15 12  ALKPHOS 73 68  BILITOT 0.8 0.7  PROT 8.1 6.0*  ALBUMIN 4.5 3.1*   CBG: Recent Labs  Lab 11/13/21 2137 11/14/21 0746 11/14/21 1218 11/14/21 2350 11/15/21 0812  GLUCAP 127* 114* 93 98 104*    Discharge time spent: greater than 30 minutes.  Signed: 01/14/22, MD [facility] 11/15/2021

## 2021-11-15 NOTE — Plan of Care (Signed)
  Problem: Clinical Measurements: Goal: Ability to maintain clinical measurements within normal limits will improve Outcome: Progressing Goal: Cardiovascular complication will be avoided Outcome: Progressing   Problem: Nutrition: Goal: Adequate nutrition will be maintained Outcome: Progressing   Problem: Coping: Goal: Level of anxiety will decrease Outcome: Progressing   Problem: Elimination: Goal: Will not experience complications related to urinary retention Outcome: Progressing   Problem: Pain Managment: Goal: General experience of comfort will improve Outcome: Progressing   Problem: Safety: Goal: Ability to remain free from injury will improve Outcome: Progressing

## 2021-11-15 NOTE — Progress Notes (Signed)
Assessed pt's dressing when assisting pt to bathroom noted more bleeding to surgical site. Paged cardiology on call about increase in bleeding it is now moderate dressing intact not saturated. No new orders received at this time. MD stated to watch site closely and to call back with further issues. Pt has been compliant with mobility and informed to notify nurse if any other issues arise.

## 2021-11-15 NOTE — Progress Notes (Signed)
Pt safely discharged. Discharge packet provided with teach-back method. VS wnL and as per flow. IVs removed, Pt verbalized understanding. All questions and concerns addressed. Awaiting on son for clothes and transport.

## 2021-11-15 NOTE — Discharge Instructions (Signed)
    Supplemental Discharge Instructions for  Pacemaker/Defibrillator Patients    Activity No heavy lifting or vigorous activity with your left/right arm for 6 to 8 weeks.  Do not raise your left/right arm above your head for one week.  Gradually raise your affected arm as drawn below.             11/19/21                      11/20/21                   11/21/21                  11/22/21  __  NO DRIVING until cleared to at your wound check visit.  WOUND CARE Keep the wound area clean and dry.  Do not get this area wet , no showers until cleared to at your wound check visit The tape/steri-strips on your wound will fall off; do not pull them off.  No bandage is needed on the site.  DO  NOT apply any creams, oils, or ointments to the wound area. If you notice any drainage or discharge from the wound, any swelling or bruising at the site, or you develop a fever > 101? F after you are discharged home, call the office at once.  Special Instructions You are still able to use cellular telephones; use the ear opposite the side where you have your pacemaker/defibrillator.  Avoid carrying your cellular phone near your device. When traveling through airports, show security personnel your identification card to avoid being screened in the metal detectors.  Ask the security personnel to use the hand wand. Avoid arc welding equipment, MRI testing (magnetic resonance imaging), TENS units (transcutaneous nerve stimulators).  Call the office for questions about other devices. Avoid electrical appliances that are in poor condition or are not properly grounded. Microwave ovens are safe to be near or to operate.

## 2021-11-27 ENCOUNTER — Other Ambulatory Visit: Payer: Self-pay

## 2021-11-27 ENCOUNTER — Emergency Department (HOSPITAL_BASED_OUTPATIENT_CLINIC_OR_DEPARTMENT_OTHER)
Admission: EM | Admit: 2021-11-27 | Discharge: 2021-11-27 | Disposition: A | Discharge status: Home / Self Care | Payer: Medicare Other | Attending: Emergency Medicine | Admitting: Emergency Medicine

## 2021-11-27 DIAGNOSIS — F0390 Unspecified dementia without behavioral disturbance: Secondary | ICD-10-CM | POA: Diagnosis not present

## 2021-11-27 DIAGNOSIS — Z950 Presence of cardiac pacemaker: Secondary | ICD-10-CM | POA: Diagnosis not present

## 2021-11-27 DIAGNOSIS — I2510 Atherosclerotic heart disease of native coronary artery without angina pectoris: Secondary | ICD-10-CM | POA: Diagnosis not present

## 2021-11-27 DIAGNOSIS — Z7982 Long term (current) use of aspirin: Secondary | ICD-10-CM | POA: Insufficient documentation

## 2021-11-27 DIAGNOSIS — D72819 Decreased white blood cell count, unspecified: Secondary | ICD-10-CM | POA: Insufficient documentation

## 2021-11-27 DIAGNOSIS — Z79899 Other long term (current) drug therapy: Secondary | ICD-10-CM | POA: Diagnosis not present

## 2021-11-27 DIAGNOSIS — R5383 Other fatigue: Secondary | ICD-10-CM | POA: Diagnosis not present

## 2021-11-27 DIAGNOSIS — R197 Diarrhea, unspecified: Secondary | ICD-10-CM | POA: Diagnosis not present

## 2021-11-27 DIAGNOSIS — R630 Anorexia: Secondary | ICD-10-CM | POA: Diagnosis not present

## 2021-11-27 DIAGNOSIS — I10 Essential (primary) hypertension: Secondary | ICD-10-CM | POA: Diagnosis not present

## 2021-11-27 LAB — URINALYSIS, ROUTINE W REFLEX MICROSCOPIC
Bilirubin Urine: NEGATIVE
Glucose, UA: NEGATIVE mg/dL
Hgb urine dipstick: NEGATIVE
Ketones, ur: NEGATIVE mg/dL
Nitrite: NEGATIVE
Specific Gravity, Urine: 1.01 (ref 1.005–1.030)
pH: 6.5 (ref 5.0–8.0)

## 2021-11-27 LAB — CBC WITH DIFFERENTIAL/PLATELET
Abs Immature Granulocytes: 0.06 10*3/uL (ref 0.00–0.07)
Basophils Absolute: 0 10*3/uL (ref 0.0–0.1)
Basophils Relative: 1 %
Eosinophils Absolute: 0.1 10*3/uL (ref 0.0–0.5)
Eosinophils Relative: 2 %
HCT: 34.5 % — ABNORMAL LOW (ref 36.0–46.0)
Hemoglobin: 11.4 g/dL — ABNORMAL LOW (ref 12.0–15.0)
Immature Granulocytes: 2 %
Lymphocytes Relative: 23 %
Lymphs Abs: 0.7 10*3/uL (ref 0.7–4.0)
MCH: 30.8 pg (ref 26.0–34.0)
MCHC: 33 g/dL (ref 30.0–36.0)
MCV: 93.2 fL (ref 80.0–100.0)
Monocytes Absolute: 0.4 10*3/uL (ref 0.1–1.0)
Monocytes Relative: 12 %
Neutro Abs: 2 10*3/uL (ref 1.7–7.7)
Neutrophils Relative %: 60 %
Platelets: 194 10*3/uL (ref 150–400)
RBC: 3.7 MIL/uL — ABNORMAL LOW (ref 3.87–5.11)
RDW: 12.8 % (ref 11.5–15.5)
WBC: 3.2 10*3/uL — ABNORMAL LOW (ref 4.0–10.5)
nRBC: 0 % (ref 0.0–0.2)

## 2021-11-27 LAB — COMPREHENSIVE METABOLIC PANEL
ALT: 14 U/L (ref 0–44)
AST: 13 U/L — ABNORMAL LOW (ref 15–41)
Albumin: 3.7 g/dL (ref 3.5–5.0)
Alkaline Phosphatase: 45 U/L (ref 38–126)
Anion gap: 10 (ref 5–15)
BUN: 11 mg/dL (ref 8–23)
CO2: 28 mmol/L (ref 22–32)
Calcium: 9.6 mg/dL (ref 8.9–10.3)
Chloride: 102 mmol/L (ref 98–111)
Creatinine, Ser: 0.73 mg/dL (ref 0.44–1.00)
GFR, Estimated: >60 mL/min (ref >60)
Glucose, Bld: 101 mg/dL — ABNORMAL HIGH (ref 70–99)
Potassium: 4.1 mmol/L (ref 3.5–5.1)
Sodium: 140 mmol/L (ref 135–145)
Total Bilirubin: 0.6 mg/dL (ref 0.3–1.2)
Total Protein: 6.8 g/dL (ref 6.5–8.1)

## 2021-11-27 LAB — MAGNESIUM: Magnesium: 1.9 mg/dL (ref 1.7–2.4)

## 2021-11-27 MED ORDER — LACTATED RINGERS IV BOLUS
500.0000 mL | Freq: Once | INTRAVENOUS | Status: AC
  Administered 2021-11-27: 500 mL via INTRAVENOUS

## 2021-11-27 NOTE — ED Provider Notes (Signed)
[facility] EMERGENCY DEPT Provider Note   CSN: [id number] Arrival date & time: 11/27/21  1051     History  Chief Complaint  Patient presents with   Diarrhea    Rita Harding is a 88 y.o. female.  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  Patient reports that she is just not felt well for the last 1 month.  She reports approximately 1 episode of diarrhea per day but just does not have much appetite.  She has no abdominal pain, blood in her stool, chest pain or shortness of breath.  Family was concerned talk with her PCP and they recommended she come here to be evaluated.  She denies any urinary symptoms and is not aware of having any fever.  She did feel that they started her on some new medication before she left the hospital but is not sure which ones.  The history is provided by the patient, a relative and medical records.  Diarrhea      Home Medications Prior to Admission medications   Medication Sig Start Date End Date Taking? Authorizing Provider  aspirin EC 81 MG tablet Take 81 mg by mouth daily.    [provider]  benazepril (LOTENSIN) 5 MG tablet Take 0.5 tablets (2.5 mg total) by mouth daily. 09/12/21   Marykay Lex, MD  carvedilol (COREG) 6.25 MG tablet Take 1 tablet (6.25 mg total) by mouth 2 (two) times daily with a meal. 11/15/21   Tyrone Nine, MD  ezetimibe (ZETIA) 10 MG tablet Take 1 tablet by mouth once daily 11/15/21   Etta Grandchild, MD  folic acid (FOLVITE) 1 MG tablet Take 1  tablet (1 mg total) by mouth daily. Patient not taking: Reported on 09/12/2021 01/09/21   Etta Grandchild, MD  rosuvastatin (CRESTOR) 20 MG tablet Take 1 tablet (20 mg total) by mouth daily. 09/12/21 12/11/21  Marykay Lex, MD      Allergies    Codeine    Review of Systems   Review of Systems  Gastrointestinal:  Positive for diarrhea.    Physical Exam Updated Vital Signs BP (!) 167/77   Pulse (!) 59   Temp 98.7 F (37.1 C)   Resp 18   Ht 5\' 2"  (1.575 m)   Wt 56 kg   SpO2 96%   BMI 22.58 kg/m  Physical Exam Vitals and nursing note reviewed.  Constitutional:      General: She is not in acute distress.    Appearance: She is well-developed.  HENT:     Head: Normocephalic and atraumatic.  Eyes:     Conjunctiva/sclera: Conjunctivae normal.     Pupils: Pupils are equal, round, and reactive to light.  Cardiovascular:     Rate and Rhythm: Normal rate and regular rhythm.     Heart sounds: No murmur heard. Pulmonary:     Effort: Pulmonary effort is normal. No respiratory distress.     Breath sounds: Normal breath sounds. No wheezing or rales.  Abdominal:     General: There is no distension.     Palpations: Abdomen is  soft.     Tenderness: There is no abdominal tenderness. There is no guarding or rebound.  Musculoskeletal:        General: No tenderness. Normal range of motion.     Cervical back: Normal range of motion and neck supple.     Comments: Trace edema in the left ankle  Skin:    General: Skin is warm and dry.     Findings: No erythema or rash.  Neurological:     Mental Status: She is alert and oriented to person, place, and time. Mental status is at baseline.  Psychiatric:        Mood and Affect: Mood normal.        Behavior: Behavior normal.     ED Results / Procedures / Treatments   Labs (all labs ordered are listed, but only abnormal results are displayed) Labs Reviewed  CBC WITH DIFFERENTIAL/PLATELET - Abnormal; Notable for the following components:       Result Value   WBC 3.2 (*)    RBC 3.70 (*)    Hemoglobin 11.4 (*)    HCT 34.5 (*)    All other components within normal limits  URINALYSIS, ROUTINE W REFLEX MICROSCOPIC - Abnormal; Notable for the following components:   APPearance HAZY (*)    Protein, ur TRACE (*)    Leukocytes,Ua LARGE (*)    Bacteria, UA RARE (*)    All other components within normal limits  COMPREHENSIVE METABOLIC PANEL - Abnormal; Notable for the following components:   Glucose, Bld 101 (*)    AST 13 (*)    All other components within normal limits  MAGNESIUM    EKG None  Radiology No results found.  Procedures Procedures    Medications Ordered in ED Medications  lactated ringers bolus 500 mL (500 mLs Intravenous New Bag/Given 11/27/21 1149)    ED Course/ Medical Decision Making/ A&P                           Medical Decision Making Amount and/or Complexity of Data Reviewed External Data Reviewed: notes.    Details: Recent hospitalization Labs: ordered. Decision-making details documented in ED Course.   Pt with multiple medical problems and comorbidities and presenting today with a complaint that caries a high risk for morbidity and mortality.  Presenting today with ongoing decreased energy, poor oral intake and 1 episode of daily diarrhea since discharge.  Family was concerned and wanted her checked out.  She reports today she feels better than she has felt since she got home.  She has no focal findings on exam.  Will check urine and labs to ensure no acute electrolyte abnormalities, new AKI or UTI.  Patient is well-appearing on exam with reassuring vital signs.  No evidence of fluid overload today or concern for ACS.  Patient was started on 2 new meds but not sure what it is possible that may be causing some of her symptoms of being fatigued and having lower energy. I independently interpreted patient's labs today and UA without evidence of UTI, CBC with leukopenia of 3.2 which is slightly lower  than it was while hospitalized and stable hemoglobin of 11.4 CMP without acute findings and magnesium is normal.  Patient's heart rate remained stable today and she still is endorsing feeling fine.  At this time no indication for admission.  Low suspicion for acute cardiac cause of her symptoms.  She has no abdominal pain or concern that  she needs a CT today.  Feel that patient is stable for discharge.  She is present with her son and she was given return precautions.  Discussed with them also confirming what new medications that she was placed on to ensure that not adding into her symptoms.  She is to follow-up with her PCP next week.         Final Clinical Impression(s) / ED Diagnoses Final diagnoses:  Diarrhea, unspecified type  Other fatigue    Rx / DC Orders ED Discharge Orders     None         Gwyneth Sprout, MD 11/27/21 1355

## 2021-11-27 NOTE — Discharge Instructions (Signed)
Your labs today do not show any specific findings.  No evidence of urinary tract infection.  Your heart rate has been normal and a pacemaker appears to be functioning.  May be the new medications that are causing you to feel this way but also after being hospitalized and undergoing a procedure it can take some time to bounce back.  Make sure you are getting up and moving around.  Make sure you are eating 3 meals a day.

## 2021-11-27 NOTE — ED Triage Notes (Signed)
Patient reports to the ER BIB family. Patient recently had a pacemaker inserted. Patient has reportedly been dealing with diarrhea for a while and has not been eating well and has been staying in bed more per family.

## 2021-11-28 ENCOUNTER — Ambulatory Visit (INDEPENDENT_AMBULATORY_CARE_PROVIDER_SITE_OTHER): Payer: Medicare Other

## 2021-11-28 DIAGNOSIS — I459 Conduction disorder, unspecified: Secondary | ICD-10-CM

## 2021-11-28 LAB — CUP PACEART INCLINIC DEVICE CHECK
Date Time Interrogation Session: 20230621000000
Implantable Lead Implant Date: 20230607
Implantable Lead Implant Date: 20230607
Implantable Lead Implant Date: 20230607
Implantable Lead Location: 753858
Implantable Lead Location: 753859
Implantable Lead Location: 753860
Implantable Lead Model: 7840
Implantable Lead Model: 7841
Implantable Lead Model: 7842
Implantable Lead Serial Number: 1061320
Implantable Lead Serial Number: 1169091
Implantable Lead Serial Number: 1287909
Implantable Pulse Generator Implant Date: 20230607
Lead Channel Pacing Threshold Amplitude: 0.7 V
Lead Channel Pacing Threshold Amplitude: 0.8 V
Lead Channel Pacing Threshold Pulse Width: 0.4 ms
Lead Channel Pacing Threshold Pulse Width: 0.4 ms
Lead Channel Sensing Intrinsic Amplitude: 25 mV
Lead Channel Sensing Intrinsic Amplitude: 3.2 mV
Lead Channel Sensing Intrinsic Amplitude: 5.8 mV
Lead Channel Setting Pacing Amplitude: 0.1 V
Lead Channel Setting Pacing Amplitude: 3.5 V
Lead Channel Setting Pacing Amplitude: 3.5 V
Lead Channel Setting Pacing Pulse Width: 0.1 ms
Lead Channel Setting Pacing Pulse Width: 0.4 ms
Lead Channel Setting Sensing Sensitivity: 2.5 mV
Lead Channel Setting Sensing Sensitivity: 2.5 mV
Pulse Gen Serial Number: 720955

## 2021-11-28 NOTE — Progress Notes (Signed)

## 2021-11-28 NOTE — Patient Instructions (Addendum)

## 2021-12-05 ENCOUNTER — Ambulatory Visit: Payer: Medicare Other | Admitting: Internal Medicine

## 2021-12-17 ENCOUNTER — Ambulatory Visit (INDEPENDENT_AMBULATORY_CARE_PROVIDER_SITE_OTHER): Payer: Medicare Other | Admitting: Internal Medicine

## 2021-12-17 ENCOUNTER — Encounter: Payer: Self-pay | Admitting: Internal Medicine

## 2021-12-17 VITALS — BP 134/84 | HR 60 | Temp 98.2°F | Ht 62.0 in | Wt 123.5 lb

## 2021-12-17 DIAGNOSIS — D539 Nutritional anemia, unspecified: Secondary | ICD-10-CM | POA: Insufficient documentation

## 2021-12-17 DIAGNOSIS — D538 Other specified nutritional anemias: Secondary | ICD-10-CM | POA: Diagnosis not present

## 2021-12-17 DIAGNOSIS — E519 Thiamine deficiency, unspecified: Secondary | ICD-10-CM

## 2021-12-17 LAB — CBC WITH DIFFERENTIAL/PLATELET
Basophils Absolute: 0 10*3/uL (ref 0.0–0.1)
Basophils Relative: 1.4 % (ref 0.0–3.0)
Eosinophils Absolute: 0.1 10*3/uL (ref 0.0–0.7)
Eosinophils Relative: 2.5 % (ref 0.0–5.0)
HCT: 34.3 % — ABNORMAL LOW (ref 36.0–46.0)
Hemoglobin: 11.5 g/dL — ABNORMAL LOW (ref 12.0–15.0)
Lymphocytes Relative: 25.8 % (ref 12.0–46.0)
Lymphs Abs: 0.8 10*3/uL (ref 0.7–4.0)
MCHC: 33.5 g/dL (ref 30.0–36.0)
MCV: 92.6 fl (ref 78.0–100.0)
Monocytes Absolute: 0.3 10*3/uL (ref 0.1–1.0)
Monocytes Relative: 10.1 % (ref 3.0–12.0)
Neutro Abs: 1.9 10*3/uL (ref 1.4–7.7)
Neutrophils Relative %: 60.2 % (ref 43.0–77.0)
Platelets: 167 10*3/uL (ref 150.0–400.0)
RBC: 3.7 Mil/uL — ABNORMAL LOW (ref 3.87–5.11)
RDW: 14.2 % (ref 11.5–15.5)
WBC: 3.2 10*3/uL — ABNORMAL LOW (ref 4.0–10.5)

## 2021-12-17 LAB — IBC + FERRITIN
Ferritin: 212.1 ng/mL (ref 10.0–291.0)
Iron: 101 ug/dL (ref 42–145)
Saturation Ratios: 31.1 % (ref 20.0–50.0)
TIBC: 324.8 ug/dL (ref 250.0–450.0)
Transferrin: 232 mg/dL (ref 212.0–360.0)

## 2021-12-17 LAB — FOLATE: Folate: 8.8 ng/mL (ref >5.9)

## 2021-12-17 LAB — VITAMIN B12: Vitamin B-12: 279 pg/mL (ref 211–911)

## 2021-12-17 NOTE — Progress Notes (Unsigned)
Subjective:  Patient ID: Rita Harding, female    DOB: 1934-04-08  Age: 88 y.o. MRN: [id number]  CC: Anemia   HPI SHAELYNN DRAGOS presents for f/up -  She has had no more diarrhea.  She denies abdominal pain, melena, or bright red blood per rectum.  Outpatient Medications Prior to Visit  Medication Sig Dispense Refill   aspirin EC 81 MG tablet Take 81 mg by mouth daily.     benazepril (LOTENSIN) 5 MG tablet Take 0.5 tablets (2.5 mg total) by mouth daily. 45 tablet 3   carvedilol (COREG) 6.25 MG tablet Take 1 tablet (6.25 mg total) by mouth 2 (two) times daily with a meal. 60 tablet 0   ezetimibe (ZETIA) 10 MG tablet Take 1 tablet by mouth once daily 90 tablet 0   folic acid (FOLVITE) 1 MG tablet Take 1 tablet (1 mg total) by mouth daily. 90 tablet 1   rosuvastatin (CRESTOR) 20 MG tablet Take 1 tablet (20 mg total) by mouth daily. 90 tablet 3   No facility-administered medications prior to visit.    ROS Review of Systems  Constitutional: Negative.  Negative for diaphoresis, fatigue and unexpected weight change.  HENT: Negative.    Eyes: Negative.   Respiratory:  Negative for cough, chest tightness, shortness of breath and wheezing.   Cardiovascular:  Negative for chest pain, palpitations and leg swelling.  Gastrointestinal:  Negative for abdominal pain, blood in stool, nausea and vomiting.  Endocrine: Negative.   Genitourinary: Negative.  Negative for difficulty urinating and dysuria.  Musculoskeletal: Negative.   Skin: Negative.   Neurological:  Negative for dizziness, weakness, light-headedness and numbness.  Hematological:  Negative for adenopathy. Does not bruise/bleed easily.  Psychiatric/Behavioral: Negative.      Objective:  BP 134/84 (BP Location: Left Arm, Patient Position: Sitting)   Pulse 60   Temp 98.2 F (36.8 C) (Oral)   Ht 5\' 2"  (1.575 m)   Wt 123 lb 8 oz (56 kg)   SpO2 96%   BMI 22.59 kg/m   BP Readings from Last 3 Encounters:  12/17/21  134/84  11/27/21 (!) 165/80  11/15/21 (!) 150/82    Wt Readings from Last 3 Encounters:  12/17/21 123 lb 8 oz (56 kg)  11/27/21 123 lb 7.3 oz (56 kg)  11/12/21 124 lb 3.2 oz (56.3 kg)    Physical Exam Vitals reviewed.  HENT:     Nose: Nose normal.     Mouth/Throat:     Mouth: Mucous membranes are moist.  Eyes:     General: No scleral icterus.    Conjunctiva/sclera: Conjunctivae normal.  Cardiovascular:     Rate and Rhythm: Normal rate and regular rhythm.     Heart sounds: No murmur heard. Pulmonary:     Effort: Pulmonary effort is normal.     Breath sounds: No stridor. No wheezing or rhonchi.  Abdominal:     Palpations: There is no mass.     Tenderness: There is no abdominal tenderness. There is no guarding.     Hernia: No hernia is present.  Musculoskeletal:     Cervical back: Neck supple.     Right lower leg: No edema.     Left lower leg: No edema.  Lymphadenopathy:     Cervical: No cervical adenopathy.  Skin:    General: Skin is warm and dry.     Coloration: Skin is pale.  Neurological:     General: No focal deficit present.  Mental Status: She is alert. Mental status is at baseline.     Lab Results  Component Value Date   WBC 3.2 (L) 12/17/2021   HGB 11.5 (L) 12/17/2021   HCT 34.3 (L) 12/17/2021   PLT 167.0 12/17/2021   GLUCOSE 101 (H) 11/27/2021   CHOL 213 (H) 01/09/2021   TRIG 183.0 (H) 01/09/2021   HDL 66.90 01/09/2021   LDLCALC 109 (H) 01/09/2021   ALT 14 11/27/2021   AST 13 (L) 11/27/2021   NA 140 11/27/2021   K 4.1 11/27/2021   CL 102 11/27/2021   CREATININE 0.73 11/27/2021   BUN 11 11/27/2021   CO2 28 11/27/2021   TSH 2.788 11/13/2021   INR 1.34 05/27/2015   HGBA1C 5.8 (H) 11/13/2021    CUP PACEART INCLINIC DEVICE CHECK  Result Date: 11/28/2021 Wound check appointment. Steri-strips removed. Wound without redness or edema. Incision edges approximated, wound well healed. Normal device function. Thresholds, sensing, and impedances  consistent with implant measurements. Device programmed at 3.5V/auto capture programmed on for extra safety margin until 3 month visit. Histogram distribution appropriate for patient and level of activity. No mode switches or high ventricular rates noted. Patient educated about wound care, arm mobility, lifting restrictions. ROV in 3 months with implanting physician.Sharia Reeve, BSN, RN   Assessment & Plan:   Marva was seen today for anemia.  Diagnoses and all orders for this visit:  Deficiency anemia- She is deficient in zinc and thiamine. -     Vitamin B12; Future -     IBC + Ferritin; Future -     CBC with Differential/Platelet; Future -     Reticulocytes; Future -     Vitamin B1; Future -     Folate; Future -     Zinc; Future -     Zinc -     Folate -     Vitamin B1 -     Reticulocytes -     CBC with Differential/Platelet -     IBC + Ferritin -     Vitamin B12  Anemia due to zinc deficiency -     zinc gluconate 50 MG tablet; Take 1 tablet (50 mg total) by mouth daily.  Anemia due to acquired thiamine deficiency -     thiamine (VITAMIN B-1) 50 MG tablet; Take 1 tablet (50 mg total) by mouth daily.   I am having Rita Harding start on zinc gluconate and thiamine. I am also having her maintain her aspirin EC, folic acid, rosuvastatin, benazepril, carvedilol, and ezetimibe.  Meds ordered this encounter  Medications   zinc gluconate 50 MG tablet    Sig: Take 1 tablet (50 mg total) by mouth daily.    Dispense:  90 tablet    Refill:  1   thiamine (VITAMIN B-1) 50 MG tablet    Sig: Take 1 tablet (50 mg total) by mouth daily.    Dispense:  90 tablet    Refill:  1     Follow-up: Return in about 6 months (around 06/19/2022).  Sanda Linger, MD

## 2021-12-17 NOTE — Patient Instructions (Signed)
Anemia  Anemia is a condition in which there is not enough red blood cells or hemoglobin in the blood. Hemoglobin is a substance in red blood cells that carries oxygen. When you do not have enough red blood cells or hemoglobin (are anemic), your body cannot get enough oxygen and your organs may not work properly. As a result, you may feel very tired or have other problems. What are the causes? Common causes of anemia include: Excessive bleeding. Anemia can be caused by excessive bleeding inside or outside the body, including bleeding from the intestines or from heavy menstrual periods in females. Poor nutrition. Long-lasting (chronic) kidney, thyroid, and liver disease. Bone marrow disorders, spleen problems, and blood disorders. Cancer and treatments for cancer. HIV (human immunodeficiency virus) and AIDS (acquired immunodeficiency syndrome). Infections, medicines, and autoimmune disorders that destroy red blood cells. What are the signs or symptoms? Symptoms of this condition include: Minor weakness. Dizziness. Headache, or difficulties concentrating and sleeping. Heartbeats that feel irregular or faster than normal (palpitations). Shortness of breath, especially with exercise. Pale skin, lips, and nails, or cold hands and feet. Indigestion and nausea. Symptoms may occur suddenly or develop slowly. If your anemia is mild, you may not have symptoms. How is this diagnosed? This condition is diagnosed based on blood tests, your medical history, and a physical exam. In some cases, a test may be needed in which cells are removed from the soft tissue inside of a bone and looked at under a microscope (bone marrow biopsy). Your health care provider may also check your stool (feces) for blood and may do additional testing to look for the cause of your bleeding. Other tests may include: Imaging tests, such as a CT scan or MRI. A procedure to see inside your esophagus and stomach (endoscopy). A  procedure to see inside your colon and rectum (colonoscopy). How is this treated? Treatment for this condition depends on the cause. If you continue to lose a lot of blood, you may need to be treated at a hospital. Treatment may include: Taking supplements of iron, vitamin B12, or folic acid. Taking a hormone medicine (erythropoietin) that can help to stimulate red blood cell growth. Having a blood transfusion. This may be needed if you lose a lot of blood. Making changes to your diet. Having surgery to remove your spleen. Follow these instructions at home: Take over-the-counter and prescription medicines only as told by your health care provider. Take supplements only as told by your health care provider. Follow any diet instructions that you were given by your health care provider. Keep all follow-up visits as told by your health care provider. This is important. Contact a health care provider if: You develop new bleeding anywhere in the body. Get help right away if: You are very weak. You are short of breath. You have pain in your abdomen or chest. You are dizzy or feel faint. You have trouble concentrating. You have bloody stools, black stools, or tarry stools. You vomit repeatedly or you vomit up blood. These symptoms may represent a serious problem that is an emergency. Do not wait to see if the symptoms will go away. Get medical help right away. Call your local emergency services (911 in the [place] Do not drive yourself to the hospital. Summary Anemia is a condition in which you do not have enough red blood cells or enough of a substance in your red blood cells that carries oxygen (hemoglobin). Symptoms may occur suddenly or develop slowly. If your anemia   is mild, you may not have symptoms. This condition is diagnosed with blood tests, a medical history, and a physical exam. Other tests may be needed. Treatment for this condition depends on the cause of the anemia. This  information is not intended to replace advice given to you by your health care provider. Make sure you discuss any questions you have with your health care provider. Document Revised: 04/10/2021 Document Reviewed: 05/04/2019 [employer] Patient Education  2023 [employer]  

## 2021-12-20 DIAGNOSIS — D538 Other specified nutritional anemias: Secondary | ICD-10-CM | POA: Insufficient documentation

## 2021-12-20 DIAGNOSIS — E519 Thiamine deficiency, unspecified: Secondary | ICD-10-CM | POA: Insufficient documentation

## 2021-12-20 LAB — RETICULOCYTES
ABS Retic: 58720 cells/uL (ref 20000–80000)
Retic Ct Pct: 1.6 %

## 2021-12-20 LAB — VITAMIN B1: Vitamin B1 (Thiamine): 8 nmol/L (ref 8–30)

## 2021-12-20 LAB — ZINC: Zinc: 53 ug/dL — ABNORMAL LOW (ref 60–130)

## 2021-12-20 MED ORDER — ZINC GLUCONATE 50 MG PO TABS: 50.0000 mg | ORAL_TABLET | Freq: Every day | ORAL | 1 refills | Status: DC

## 2021-12-20 MED ORDER — VITAMIN B-1 50 MG PO TABS: 50.0000 mg | ORAL_TABLET | Freq: Every day | ORAL | 1 refills | Status: DC

## 2022-01-02 DIAGNOSIS — H00014 Hordeolum externum left upper eyelid: Secondary | ICD-10-CM | POA: Diagnosis not present

## 2022-01-02 DIAGNOSIS — H1032 Unspecified acute conjunctivitis, left eye: Secondary | ICD-10-CM | POA: Diagnosis not present

## 2022-01-02 DIAGNOSIS — H0100A Unspecified blepharitis right eye, upper and lower eyelids: Secondary | ICD-10-CM | POA: Diagnosis not present

## 2022-01-02 DIAGNOSIS — H0100B Unspecified blepharitis left eye, upper and lower eyelids: Secondary | ICD-10-CM | POA: Diagnosis not present

## 2022-01-07 DIAGNOSIS — H401132 Primary open-angle glaucoma, bilateral, moderate stage: Secondary | ICD-10-CM | POA: Diagnosis not present

## 2022-01-07 DIAGNOSIS — H0014 Chalazion left upper eyelid: Secondary | ICD-10-CM | POA: Diagnosis not present

## 2022-01-07 DIAGNOSIS — H2513 Age-related nuclear cataract, bilateral: Secondary | ICD-10-CM | POA: Diagnosis not present

## 2022-01-07 DIAGNOSIS — H353132 Nonexudative age-related macular degeneration, bilateral, intermediate dry stage: Secondary | ICD-10-CM | POA: Diagnosis not present

## 2022-02-18 ENCOUNTER — Ambulatory Visit (INDEPENDENT_AMBULATORY_CARE_PROVIDER_SITE_OTHER): Payer: Medicare Other

## 2022-02-18 DIAGNOSIS — I459 Conduction disorder, unspecified: Secondary | ICD-10-CM

## 2022-02-19 ENCOUNTER — Encounter: Payer: Self-pay | Admitting: Cardiology

## 2022-02-19 ENCOUNTER — Ambulatory Visit: Payer: Medicare Other | Attending: Cardiology | Admitting: Cardiology

## 2022-02-19 VITALS — BP 104/62 | HR 77 | Ht 62.0 in | Wt 127.4 lb

## 2022-02-19 DIAGNOSIS — I441 Atrioventricular block, second degree: Secondary | ICD-10-CM | POA: Diagnosis not present

## 2022-02-19 LAB — CUP PACEART REMOTE DEVICE CHECK
Battery Remaining Longevity: 126 mo
Battery Remaining Percentage: 100 %
Brady Statistic RA Percent Paced: 13 %
Brady Statistic RV Percent Paced: 97 %
Date Time Interrogation Session: 20230911050100
Implantable Lead Implant Date: 20230607
Implantable Lead Implant Date: 20230607
Implantable Lead Implant Date: 20230607
Implantable Lead Location: 753858
Implantable Lead Location: 753859
Implantable Lead Location: 753860
Implantable Lead Model: 7840
Implantable Lead Model: 7841
Implantable Lead Model: 7842
Implantable Lead Serial Number: 1061320
Implantable Lead Serial Number: 1169091
Implantable Lead Serial Number: 1287909
Implantable Pulse Generator Implant Date: 20230607
Lead Channel Impedance Value: 580 Ohm
Lead Channel Impedance Value: 689 Ohm
Lead Channel Pacing Threshold Amplitude: 0.4 V
Lead Channel Pacing Threshold Amplitude: 1 V
Lead Channel Pacing Threshold Pulse Width: 0.4 ms
Lead Channel Pacing Threshold Pulse Width: 0.4 ms
Lead Channel Setting Pacing Amplitude: 0.1 V
Lead Channel Setting Pacing Amplitude: 3.5 V
Lead Channel Setting Pacing Amplitude: 3.5 V
Lead Channel Setting Pacing Pulse Width: 0.1 ms
Lead Channel Setting Pacing Pulse Width: 0.4 ms
Lead Channel Setting Sensing Sensitivity: 2.5 mV
Lead Channel Setting Sensing Sensitivity: 2.5 mV
Pulse Gen Serial Number: 720955

## 2022-02-19 NOTE — Progress Notes (Signed)
Electrophysiology Office Note   Date:  02/19/2022   ID:  Rita Harding, DOB [birth year], MRN [id number]  PCP:  Rita Grandchild, MD  Cardiologist:  Rita Harding Primary Electrophysiologist:  Rita Dehne Jorja Loa, MD    Chief Complaint: pacemaker   History of Present Illness: Rita Harding is a 88 y.o. female who is being seen today for the evaluation of pacemaker at the request of Rita Grandchild, MD. Presenting today for electrophysiology evaluation.  She has a history significant for remote Takotsubo cardiomyopathy with recovered ejection fraction, hypertension, hyperlipidemia.  She presented to the hospital 11/13/2021 with lightheadedness and dizziness.  She was found to be in complete heart block.  She is now status post [employer] dual-chamber pacemaker implanted 11/14/2021.  During the procedure, the left bundle lead became adhered to the right ventricle and cannot be removed.  She had a separate right ventricular apical lead implanted.  Today, she denies symptoms of palpitations, chest pain, shortness of breath, orthopnea, PND, lower extremity edema, claudication, dizziness, presyncope, syncope, bleeding, or neurologic sequela. The patient is tolerating medications without difficulties.    Past Medical History:  Diagnosis Date   Coronary artery disease 2007   non-critcal coronary single disease   History of DVT (deep vein thrombosis)    "LLE"; BEEN OFF  COUMADIN SINCE Sept 2012   Hyperlipidemia    Hypertension    MI (myocardial infarction) (HCC) 2007   Thought to be takotsubo cardiomyopathy   Takotsubo cardiomyopathy 2007   Result with normal EF as OF 2013   Past Surgical History:  Procedure Laterality Date   AMPUTATION Left 08/21/2018   Procedure: REVISION AMPUTATION LEFT RING FINGER AND INCISION AND DRAINAGE LEFT LONG FINGER;  Surgeon: Rita Loa, MD;  Location: [facility];  Service: Orthopedics;  Laterality: Left;   APPENDECTOMY     CARDIAC  CATHETERIZATION  01/03/2006    50-60% LAD with some myocardial  bridging; NON ISCHEMIC  CARDIOMYOPATHY  with EF  30 to 35% no evidence of infarct -- anteroapical and inferapical wall motin abnormalities compatilble with Takotsubo-type syndrome   DOPPLER LEFT LOWER EXTREMITY ([facility] HX) Left 01/29/2011   left saphenous vein -small amt of chroinc appearing calicification on anterior wall that was nonobstructing,mildly abnormal    DOPPLER RIGHT ADDITIONAL EXTR ([facility] HX) Right 01/29/2011   FIBULA FRACTURE SURGERY Left 2009   UNDER CARE DR Rita Harding   FRACTURE SURGERY     NM MYOVIEW LTD  02/21/2012   low risk  and normal EF 70% or greater   PACEMAKER IMPLANT N/A 11/14/2021   Procedure: PACEMAKER IMPLANT;  Surgeon: Rita Lemming, MD;  Location: [facility];  Service: Cardiovascular;  Laterality: N/A;   TONSILLECTOMY     TRANSTHORACIC ECHOCARDIOGRAM  07/31/2010   no MVP ,NORMAL SYSTOLIC FUNCTION (EF >55%). Mild Ao Sclerosis.       Current Outpatient Medications  Medication Sig Dispense Refill   aspirin EC 81 MG tablet Take 81 mg by mouth daily.     benazepril (LOTENSIN) 5 MG tablet Take 0.5 tablets (2.5 mg total) by mouth daily. 45 tablet 3   carvedilol (COREG) 6.25 MG tablet Take 1 tablet (6.25 mg total) by mouth 2 (two) times daily with a meal. 60 tablet 0   ezetimibe (ZETIA) 10 MG tablet Take 1 tablet by mouth once daily 90 tablet 0   folic acid (FOLVITE) 1 MG tablet Take 1 tablet (1 mg total) by mouth daily. 90 tablet 1  rosuvastatin (CRESTOR) 20 MG tablet Take 1 tablet (20 mg total) by mouth daily. 90 tablet 3   thiamine (VITAMIN B-1) 50 MG tablet Take 1 tablet (50 mg total) by mouth daily. 90 tablet 1   zinc gluconate 50 MG tablet Take 1 tablet (50 mg total) by mouth daily. 90 tablet 1   No current facility-administered medications for this visit.    Allergies:   Codeine   Social History:  The patient  reports that she has quit smoking. Her smoking use included  cigarettes. She has a 15.00 pack-year smoking history. She has never used smokeless tobacco. She reports current alcohol use of about 25.0 standard drinks of alcohol per week. She reports that she does not use drugs.   Family History:  The patient's family history includes Cancer in her maternal grandmother and mother; Heart attack in her father; Heart disease in her paternal grandfather and paternal grandmother.    ROS:  Please see the history of present illness.   Otherwise, review of systems is positive for none.   All other systems are reviewed and negative.    PHYSICAL EXAM: VS:  BP 104/62   Pulse 77   Ht 5\' 2"  (1.575 m)   Wt 127 lb 6.4 oz (57.8 kg)   SpO2 92%   BMI 23.30 kg/m  , BMI Body mass index is 23.3 kg/m. GEN: Well nourished, well developed, in no acute distress  HEENT: normal  Neck: no JVD, carotid bruits, or masses Cardiac: RRR; no murmurs, rubs, or gallops,no edema  Respiratory:  clear to auscultation bilaterally, normal work of breathing GI: soft, nontender, nondistended, + BS MS: no deformity or atrophy  Skin: warm and dry, device pocket is well healed Neuro:  Strength and sensation are intact Psych: euthymic mood, full affect  EKG:  EKG is ordered today. Personal review of the ekg ordered shows sinus rhythm, ventricular paced, rate 77  Device interrogation is reviewed today in detail.  See PaceArt for details.   Recent Labs: 11/12/2021: B Natriuretic Peptide 230.3 11/13/2021: TSH 2.788 11/27/2021: ALT 14; BUN 11; Creatinine, Ser 0.73; Magnesium 1.9; Potassium 4.1; Sodium 140 12/17/2021: Hemoglobin 11.5; Platelets 167.0    Lipid Panel     Component Value Date/Time   CHOL 213 (H) 01/09/2021 1457   TRIG 183.0 (H) 01/09/2021 1457   HDL 66.90 01/09/2021 1457   CHOLHDL 3 01/09/2021 1457   VLDL 36.6 01/09/2021 1457   LDLCALC 109 (H) 01/09/2021 1457     Wt Readings from Last 3 Encounters:  02/19/22 127 lb 6.4 oz (57.8 kg)  12/17/21 123 lb 8 oz (56 kg)   11/27/21 123 lb 7.3 oz (56 kg)      Other studies Reviewed: Additional studies/ records that were reviewed today include: TTE 11/13/21  Review of the above records today demonstrates:   1. Limited study for LVEF. EF 60%, unchanged from prior.   2. Left ventricular ejection fraction, by estimation, is 55 to 60%. The  left ventricle has normal function. The left ventricle has no regional  wall motion abnormalities.   3. Right ventricular systolic function is normal. The right ventricular  size is normal.   4. The mitral valve is grossly normal.   5. The inferior vena cava is normal in size with greater than 50%  respiratory variability, suggesting right atrial pressure of 3 mmHg.    ASSESSMENT AND PLAN:  1.  Second-degree AV block: Status post [employer] dual-chamber pacemaker implanted 11/14/2021.  Device functioning appropriately.  She is conducting today.  Have made adjustments to her AV delays to allow to conduct.  2.  Hypertension: well controlled    Current medicines are reviewed at length with the patient today.   The patient has concerns regarding her medicines.  The following changes were made today:  none  Labs/ tests ordered today include:  Orders Placed This Encounter  Procedures   EKG 12-Lead     Disposition:   FU with Arianie Couse 1 year  Signed, Dolora Ridgely Jorja Loa, MD  02/19/2022 2:59 PM     [facility] [address] [place] [address] [phone] (office) [phone] (fax)

## 2022-03-07 NOTE — Progress Notes (Signed)
Remote pacemaker transmission.   

## 2022-05-07 DIAGNOSIS — H401131 Primary open-angle glaucoma, bilateral, mild stage: Secondary | ICD-10-CM | POA: Diagnosis not present

## 2022-05-20 ENCOUNTER — Ambulatory Visit (INDEPENDENT_AMBULATORY_CARE_PROVIDER_SITE_OTHER): Payer: Medicare Other

## 2022-05-20 DIAGNOSIS — I441 Atrioventricular block, second degree: Secondary | ICD-10-CM

## 2022-05-21 LAB — CUP PACEART REMOTE DEVICE CHECK
Battery Remaining Longevity: 132 mo
Battery Remaining Percentage: 100 %
Brady Statistic RA Percent Paced: 22 %
Brady Statistic RV Percent Paced: 24 %
Date Time Interrogation Session: 20231211050100
Implantable Lead Connection Status: 753985
Implantable Lead Connection Status: 753985
Implantable Lead Connection Status: 753985
Implantable Lead Implant Date: 20230607
Implantable Lead Implant Date: 20230607
Implantable Lead Implant Date: 20230607
Implantable Lead Location: 753858
Implantable Lead Location: 753859
Implantable Lead Location: 753860
Implantable Lead Model: 7840
Implantable Lead Model: 7841
Implantable Lead Model: 7842
Implantable Lead Serial Number: 1061320
Implantable Lead Serial Number: 1169091
Implantable Lead Serial Number: 1287909
Implantable Pulse Generator Implant Date: 20230607
Lead Channel Impedance Value: 516 Ohm
Lead Channel Impedance Value: 667 Ohm
Lead Channel Pacing Threshold Amplitude: 0.4 V
Lead Channel Pacing Threshold Amplitude: 1 V
Lead Channel Pacing Threshold Pulse Width: 0.4 ms
Lead Channel Pacing Threshold Pulse Width: 0.4 ms
Lead Channel Setting Pacing Amplitude: 0.1 V
Lead Channel Setting Pacing Amplitude: 2 V
Lead Channel Setting Pacing Amplitude: 2.5 V
Lead Channel Setting Pacing Pulse Width: 0.1 ms
Lead Channel Setting Pacing Pulse Width: 0.4 ms
Lead Channel Setting Sensing Sensitivity: 2.5 mV
Lead Channel Setting Sensing Sensitivity: 2.5 mV
Pulse Gen Serial Number: 720955
Zone Setting Status: 755011

## 2022-05-22 ENCOUNTER — Telehealth: Payer: Self-pay | Admitting: Internal Medicine

## 2022-05-22 NOTE — Telephone Encounter (Signed)
Called patient to schedule AWV-I with [facility]. Asked if I would call her back in a few hours because she was out. Will call patient back later.

## 2022-05-27 ENCOUNTER — Other Ambulatory Visit: Payer: Self-pay | Admitting: Internal Medicine

## 2022-05-27 DIAGNOSIS — I2510 Atherosclerotic heart disease of native coronary artery without angina pectoris: Secondary | ICD-10-CM

## 2022-05-27 DIAGNOSIS — E785 Hyperlipidemia, unspecified: Secondary | ICD-10-CM

## 2022-05-27 DIAGNOSIS — I10 Essential (primary) hypertension: Secondary | ICD-10-CM

## 2022-05-27 MED ORDER — EZETIMIBE 10 MG PO TABS: 10.0000 mg | ORAL_TABLET | Freq: Every day | ORAL | 1 refills | Status: DC

## 2022-06-07 ENCOUNTER — Other Ambulatory Visit: Payer: Self-pay | Admitting: Internal Medicine

## 2022-06-07 DIAGNOSIS — E785 Hyperlipidemia, unspecified: Secondary | ICD-10-CM

## 2022-06-19 ENCOUNTER — Ambulatory Visit (INDEPENDENT_AMBULATORY_CARE_PROVIDER_SITE_OTHER): Payer: Medicare Other | Admitting: Internal Medicine

## 2022-06-19 ENCOUNTER — Encounter: Payer: Self-pay | Admitting: Internal Medicine

## 2022-06-19 VITALS — BP 134/78 | HR 63 | Temp 98.2°F | Ht 62.0 in | Wt 128.0 lb

## 2022-06-19 DIAGNOSIS — E538 Deficiency of other specified B group vitamins: Secondary | ICD-10-CM | POA: Diagnosis not present

## 2022-06-19 DIAGNOSIS — K5904 Chronic idiopathic constipation: Secondary | ICD-10-CM | POA: Diagnosis not present

## 2022-06-19 DIAGNOSIS — Z0001 Encounter for general adult medical examination with abnormal findings: Secondary | ICD-10-CM

## 2022-06-19 DIAGNOSIS — D510 Vitamin B12 deficiency anemia due to intrinsic factor deficiency: Secondary | ICD-10-CM

## 2022-06-19 DIAGNOSIS — Z0000 Encounter for general adult medical examination without abnormal findings: Secondary | ICD-10-CM | POA: Diagnosis not present

## 2022-06-19 DIAGNOSIS — D539 Nutritional anemia, unspecified: Secondary | ICD-10-CM

## 2022-06-19 DIAGNOSIS — E519 Thiamine deficiency, unspecified: Secondary | ICD-10-CM | POA: Diagnosis not present

## 2022-06-19 DIAGNOSIS — D538 Other specified nutritional anemias: Secondary | ICD-10-CM | POA: Diagnosis not present

## 2022-06-19 DIAGNOSIS — E785 Hyperlipidemia, unspecified: Secondary | ICD-10-CM | POA: Diagnosis not present

## 2022-06-19 LAB — IBC + FERRITIN
Ferritin: 109.7 ng/mL (ref 10.0–291.0)
Iron: 93 ug/dL (ref 42–145)
Saturation Ratios: 26.8 % (ref 20.0–50.0)
TIBC: 347.2 ug/dL (ref 250.0–450.0)
Transferrin: 248 mg/dL (ref 212.0–360.0)

## 2022-06-19 LAB — CBC WITH DIFFERENTIAL/PLATELET
Basophils Absolute: 0 10*3/uL (ref 0.0–0.1)
Basophils Relative: 0.8 % (ref 0.0–3.0)
Eosinophils Absolute: 0.1 10*3/uL (ref 0.0–0.7)
Eosinophils Relative: 1.3 % (ref 0.0–5.0)
HCT: 37.5 % (ref 36.0–46.0)
Hemoglobin: 12.5 g/dL (ref 12.0–15.0)
Lymphocytes Relative: 20.5 % (ref 12.0–46.0)
Lymphs Abs: 1 10*3/uL (ref 0.7–4.0)
MCHC: 33.4 g/dL (ref 30.0–36.0)
MCV: 95.6 fl (ref 78.0–100.0)
Monocytes Absolute: 0.4 10*3/uL (ref 0.1–1.0)
Monocytes Relative: 8.9 % (ref 3.0–12.0)
Neutro Abs: 3.3 10*3/uL (ref 1.4–7.7)
Neutrophils Relative %: 68.5 % (ref 43.0–77.0)
Platelets: 168 10*3/uL (ref 150.0–400.0)
RBC: 3.93 Mil/uL (ref 3.87–5.11)
RDW: 14.6 % (ref 11.5–15.5)
WBC: 4.8 10*3/uL (ref 4.0–10.5)

## 2022-06-19 LAB — FOLATE: Folate: 6.5 ng/mL (ref >5.9)

## 2022-06-19 LAB — LIPID PANEL
Cholesterol: 195 mg/dL (ref 0–200)
HDL: 83.2 mg/dL (ref >39.00)
LDL Cholesterol: 85 mg/dL (ref 0–99)
NonHDL: 111.44
Total CHOL/HDL Ratio: 2
Triglycerides: 131 mg/dL (ref 0.0–149.0)
VLDL: 26.2 mg/dL (ref 0.0–40.0)

## 2022-06-19 LAB — TSH: TSH: 2.97 u[IU]/mL (ref 0.35–5.50)

## 2022-06-19 LAB — HEPATIC FUNCTION PANEL
ALT: 14 U/L (ref 0–35)
AST: 20 U/L (ref 0–37)
Albumin: 4.3 g/dL (ref 3.5–5.2)
Alkaline Phosphatase: 85 U/L (ref 39–117)
Bilirubin, Direct: 0.1 mg/dL (ref 0.0–0.3)
Total Bilirubin: 0.5 mg/dL (ref 0.2–1.2)
Total Protein: 7.1 g/dL (ref 6.0–8.3)

## 2022-06-19 LAB — VITAMIN B12: Vitamin B-12: 173 pg/mL — ABNORMAL LOW (ref 211–911)

## 2022-06-19 LAB — MAGNESIUM: Magnesium: 2.1 mg/dL (ref 1.5–2.5)

## 2022-06-19 MED ORDER — LUBIPROSTONE 24 MCG PO CAPS: 24.0000 ug | ORAL_CAPSULE | Freq: Two times a day (BID) | ORAL | 1 refills | Status: DC

## 2022-06-19 NOTE — Patient Instructions (Signed)

## 2022-06-19 NOTE — Progress Notes (Signed)
Subjective:  Patient ID: Rita Harding, female    DOB: 1933-10-07  Age: 89 y.o. MRN: [id number]  CC: Hyperlipidemia and Anemia   HPI Rita Harding presents for a CPX.  She complains of constipation, small hard stools, and straining.  Outpatient Medications Prior to Visit  Medication Sig Dispense Refill   aspirin EC 81 MG tablet Take 81 mg by mouth daily.     benazepril (LOTENSIN) 5 MG tablet Take 0.5 tablets (2.5 mg total) by mouth daily. 45 tablet 3   carvedilol (COREG) 6.25 MG tablet Take 1 tablet (6.25 mg total) by mouth 2 (two) times daily with a meal. 60 tablet 0   ezetimibe (ZETIA) 10 MG tablet Take 1 tablet by mouth once daily 90 tablet 0   folic acid (FOLVITE) 1 MG tablet Take 1 tablet (1 mg total) by mouth daily. 90 tablet 1   thiamine (VITAMIN B-1) 50 MG tablet Take 1 tablet (50 mg total) by mouth daily. 90 tablet 1   zinc gluconate 50 MG tablet Take 1 tablet (50 mg total) by mouth daily. 90 tablet 1   rosuvastatin (CRESTOR) 20 MG tablet Take 1 tablet (20 mg total) by mouth daily. 90 tablet 3   No facility-administered medications prior to visit.    ROS Review of Systems  Constitutional: Negative.  Negative for chills, diaphoresis, fatigue and fever.  HENT: Negative.  Negative for trouble swallowing.   Eyes: Negative.   Respiratory:  Negative for cough, chest tightness, shortness of breath and wheezing.   Cardiovascular:  Negative for chest pain, palpitations and leg swelling.  Gastrointestinal:  Positive for constipation. Negative for abdominal pain, blood in stool, diarrhea, nausea and vomiting.  Endocrine: Negative.   Genitourinary: Negative.  Negative for difficulty urinating.  Musculoskeletal: Negative.   Skin: Negative.  Negative for color change, pallor and rash.  Neurological:  Negative for dizziness, facial asymmetry and weakness.  Hematological:  Negative for adenopathy. Does not bruise/bleed easily.  Psychiatric/Behavioral: Negative.       Objective:  BP 134/78 (BP Location: Right Arm, Patient Position: Sitting, Cuff Size: Normal)   Pulse 63   Temp 98.2 F (36.8 C) (Oral)   Ht 5\' 2"  (1.575 m)   Wt 128 lb (58.1 kg)   SpO2 96%   BMI 23.41 kg/m   BP Readings from Last 3 Encounters:  06/19/22 134/78  02/19/22 104/62  12/17/21 134/84    Wt Readings from Last 3 Encounters:  06/19/22 128 lb (58.1 kg)  02/19/22 127 lb 6.4 oz (57.8 kg)  12/17/21 123 lb 8 oz (56 kg)    Physical Exam Vitals reviewed.  Constitutional:      Appearance: She is not ill-appearing.  HENT:     Nose: Nose normal.     Mouth/Throat:     Mouth: Mucous membranes are moist.  Eyes:     General: No scleral icterus.    Conjunctiva/sclera: Conjunctivae normal.  Cardiovascular:     Rate and Rhythm: Normal rate and regular rhythm.     Heart sounds: S1 normal. Murmur heard.     No friction rub. No gallop.  Pulmonary:     Effort: Pulmonary effort is normal.     Breath sounds: No stridor. No wheezing, rhonchi or rales.  Abdominal:     General: Abdomen is flat.     Palpations: There is no mass.     Tenderness: There is no abdominal tenderness. There is no guarding.     Hernia: No hernia is  present.  Musculoskeletal:        General: Normal range of motion.     Cervical back: Neck supple.     Right lower leg: No edema.     Left lower leg: No edema.  Lymphadenopathy:     Cervical: No cervical adenopathy.  Skin:    General: Skin is warm and dry.  Neurological:     General: No focal deficit present.     Mental Status: She is alert. Mental status is at baseline.  Psychiatric:        Mood and Affect: Mood normal.        Behavior: Behavior normal.     Lab Results  Component Value Date   WBC 4.8 06/19/2022   HGB 12.5 06/19/2022   HCT 37.5 06/19/2022   PLT 168.0 06/19/2022   GLUCOSE 101 (H) 11/27/2021   CHOL 195 06/19/2022   TRIG 131.0 06/19/2022   HDL 83.20 06/19/2022   LDLCALC 85 06/19/2022   ALT 14 06/19/2022   AST 20  06/19/2022   NA 140 11/27/2021   K 4.1 11/27/2021   CL 102 11/27/2021   CREATININE 0.73 11/27/2021   BUN 11 11/27/2021   CO2 28 11/27/2021   TSH 2.97 06/19/2022   INR 1.34 05/27/2015   HGBA1C 5.8 (H) 11/13/2021    CUP PACEART INCLINIC DEVICE CHECK  Result Date: 11/28/2021 Wound check appointment. Steri-strips removed. Wound without redness or edema. Incision edges approximated, wound well healed. Normal device function. Thresholds, sensing, and impedances consistent with implant measurements. Device programmed at 3.5V/auto capture programmed on for extra safety margin until 3 month visit. Histogram distribution appropriate for patient and level of activity. No mode switches or high ventricular rates noted. Patient educated about wound care, arm mobility, lifting restrictions. ROV in 3 months with implanting physician.Sharia Reeve, BSN, RN   Assessment & Plan:   Rita Harding was seen today for hyperlipidemia and anemia.  Diagnoses and all orders for this visit:  Dietary folate deficiency- Folate is normal now. -     Vitamin B12; Future -     Folate; Future -     Folate -     Vitamin B12  Deficiency anemia- She is no longer anemic but B12 is low. -     IBC + Ferritin; Future -     Zinc; Future -     Vitamin B1; Future -     Reticulocytes; Future -     Vitamin B12; Future -     CBC with Differential/Platelet; Future -     Folate; Future -     Folate -     CBC with Differential/Platelet -     Vitamin B12 -     Reticulocytes -     Vitamin B1 -     Zinc -     IBC + Ferritin  Anemia due to acquired thiamine deficiency -     Vitamin B1; Future -     Vitamin B1  Anemia due to zinc deficiency -     Zinc; Future -     Zinc  Hyperlipidemia with target LDL less than 100- LDL goal achieved. Doing well on the statin  -     TSH; Future -     Hepatic function panel; Future -     Lipid panel; Future -     Lipid panel -     Hepatic function panel -     TSH  Chronic idiopathic  constipation - Labs are normal.  Will treat. -     lubiprostone (AMITIZA) 24 MCG capsule; Take 1 capsule (24 mcg total) by mouth 2 (two) times daily with a meal. -     TSH; Future -     Magnesium; Future -     Magnesium -     TSH  Vitamin B12 deficiency anemia due to intrinsic factor deficiency- Will start parenteral B12.  Encounter for general adult medical examination with abnormal findings- Exam completed, labs reviewed, vaccines are UTD, no concern screenings indicated, pt ed material was given.   I have discontinued Lowry Bowl Fetch's folic acid, zinc gluconate, thiamine, and ezetimibe. I am also having her start on lubiprostone. Additionally, I am having her maintain her aspirin EC, rosuvastatin, benazepril, and carvedilol.  Meds ordered this encounter  Medications   lubiprostone (AMITIZA) 24 MCG capsule    Sig: Take 1 capsule (24 mcg total) by mouth 2 (two) times daily with a meal.    Dispense:  180 capsule    Refill:  1     Follow-up: Return in about 6 months (around 12/18/2022).  Scarlette Calico, MD

## 2022-06-20 DIAGNOSIS — D510 Vitamin B12 deficiency anemia due to intrinsic factor deficiency: Secondary | ICD-10-CM | POA: Insufficient documentation

## 2022-06-24 ENCOUNTER — Telehealth: Payer: Self-pay | Admitting: Internal Medicine

## 2022-06-24 ENCOUNTER — Ambulatory Visit (INDEPENDENT_AMBULATORY_CARE_PROVIDER_SITE_OTHER): Payer: Medicare Other | Admitting: *Deleted

## 2022-06-24 DIAGNOSIS — D510 Vitamin B12 deficiency anemia due to intrinsic factor deficiency: Secondary | ICD-10-CM | POA: Diagnosis not present

## 2022-06-24 LAB — ZINC: Zinc: 62 ug/dL (ref 60–130)

## 2022-06-24 LAB — RETICULOCYTES
ABS Retic: 50050 cells/uL (ref 20000–80000)
Retic Ct Pct: 1.3 %

## 2022-06-24 LAB — VITAMIN B1: Vitamin B1 (Thiamine): 8 nmol/L (ref 8–30)

## 2022-06-24 MED ORDER — CYANOCOBALAMIN 1000 MCG/ML IJ SOLN
1000.0000 ug | Freq: Once | INTRAMUSCULAR | Status: AC
  Administered 2022-06-24: 1000 ug via INTRAMUSCULAR

## 2022-06-24 NOTE — Telephone Encounter (Signed)
PT visits today for their B-12 shot and was wanting to know if there were any other ways for her to be increasing her B-12? Any supplements, foods, or lifestyle changes that would be suggested?  CB: [phone]

## 2022-06-24 NOTE — Progress Notes (Signed)
Patient is here for her B12 injection. Given in left deltoid. Patient tolerated well

## 2022-06-28 NOTE — Progress Notes (Signed)
Remote pacemaker transmission.   

## 2022-07-03 ENCOUNTER — Ambulatory Visit (INDEPENDENT_AMBULATORY_CARE_PROVIDER_SITE_OTHER): Payer: Medicare Other | Admitting: *Deleted

## 2022-07-03 DIAGNOSIS — D510 Vitamin B12 deficiency anemia due to intrinsic factor deficiency: Secondary | ICD-10-CM

## 2022-07-03 MED ORDER — CYANOCOBALAMIN 1000 MCG/ML IJ SOLN
1000.0000 ug | Freq: Once | INTRAMUSCULAR | Status: AC
  Administered 2022-07-03: 1000 ug via INTRAMUSCULAR

## 2022-07-03 NOTE — Progress Notes (Signed)
Pls cosign for B12 inj../lmb  

## 2022-07-10 ENCOUNTER — Ambulatory Visit (INDEPENDENT_AMBULATORY_CARE_PROVIDER_SITE_OTHER): Payer: Medicare Other | Admitting: *Deleted

## 2022-07-10 DIAGNOSIS — D510 Vitamin B12 deficiency anemia due to intrinsic factor deficiency: Secondary | ICD-10-CM

## 2022-07-10 MED ORDER — CYANOCOBALAMIN 1000 MCG/ML IJ SOLN
1000.0000 ug | Freq: Once | INTRAMUSCULAR | Status: AC
  Administered 2022-07-10: 1000 ug via INTRAMUSCULAR

## 2022-07-10 NOTE — Progress Notes (Signed)
Pls cosign for B12 inj../lmb  

## 2022-07-17 ENCOUNTER — Ambulatory Visit (INDEPENDENT_AMBULATORY_CARE_PROVIDER_SITE_OTHER): Payer: Medicare Other | Admitting: *Deleted

## 2022-07-17 DIAGNOSIS — D510 Vitamin B12 deficiency anemia due to intrinsic factor deficiency: Secondary | ICD-10-CM

## 2022-07-17 MED ORDER — CYANOCOBALAMIN 1000 MCG/ML IJ SOLN
1000.0000 ug | Freq: Once | INTRAMUSCULAR | Status: AC
  Administered 2022-07-17: 1000 ug via INTRAMUSCULAR

## 2022-07-17 NOTE — Progress Notes (Signed)
Pls cosign for B12 inj../lmb  

## 2022-07-25 ENCOUNTER — Ambulatory Visit: Payer: Medicare Other

## 2022-08-16 ENCOUNTER — Ambulatory Visit: Payer: Medicare Other

## 2022-08-19 ENCOUNTER — Ambulatory Visit (INDEPENDENT_AMBULATORY_CARE_PROVIDER_SITE_OTHER): Payer: Medicare Other | Admitting: *Deleted

## 2022-08-19 ENCOUNTER — Ambulatory Visit (INDEPENDENT_AMBULATORY_CARE_PROVIDER_SITE_OTHER): Payer: Medicare Other

## 2022-08-19 DIAGNOSIS — D510 Vitamin B12 deficiency anemia due to intrinsic factor deficiency: Secondary | ICD-10-CM | POA: Diagnosis not present

## 2022-08-19 DIAGNOSIS — I441 Atrioventricular block, second degree: Secondary | ICD-10-CM | POA: Diagnosis not present

## 2022-08-19 MED ORDER — CYANOCOBALAMIN 1000 MCG/ML IJ SOLN
1000.0000 ug | Freq: Once | INTRAMUSCULAR | Status: AC
  Administered 2022-08-19: 1000 ug via INTRAMUSCULAR

## 2022-08-19 NOTE — Progress Notes (Signed)
Pls cosign for B12 inj../lmb  

## 2022-08-21 LAB — CUP PACEART REMOTE DEVICE CHECK
Battery Remaining Longevity: 132 mo
Battery Remaining Percentage: 100 %
Brady Statistic RA Percent Paced: 22 %
Brady Statistic RV Percent Paced: 28 %
Date Time Interrogation Session: 20240311050100
Implantable Lead Connection Status: 753985
Implantable Lead Connection Status: 753985
Implantable Lead Connection Status: 753985
Implantable Lead Implant Date: 20230607
Implantable Lead Implant Date: 20230607
Implantable Lead Implant Date: 20230607
Implantable Lead Location: 753858
Implantable Lead Location: 753859
Implantable Lead Location: 753860
Implantable Lead Model: 7840
Implantable Lead Model: 7841
Implantable Lead Model: 7842
Implantable Lead Serial Number: 1061320
Implantable Lead Serial Number: 1169091
Implantable Lead Serial Number: 1287909
Implantable Pulse Generator Implant Date: 20230607
Lead Channel Impedance Value: 482 Ohm
Lead Channel Impedance Value: 698 Ohm
Lead Channel Pacing Threshold Amplitude: 0.6 V
Lead Channel Pacing Threshold Amplitude: 1.1 V
Lead Channel Pacing Threshold Pulse Width: 0.4 ms
Lead Channel Pacing Threshold Pulse Width: 0.4 ms
Lead Channel Setting Pacing Amplitude: 0.1 V
Lead Channel Setting Pacing Amplitude: 2 V
Lead Channel Setting Pacing Amplitude: 2.5 V
Lead Channel Setting Pacing Pulse Width: 0.1 ms
Lead Channel Setting Pacing Pulse Width: 0.4 ms
Lead Channel Setting Sensing Sensitivity: 2.5 mV
Lead Channel Setting Sensing Sensitivity: 2.5 mV
Pulse Gen Serial Number: 720955
Zone Setting Status: 755011

## 2022-08-24 ENCOUNTER — Other Ambulatory Visit: Payer: Self-pay | Admitting: Internal Medicine

## 2022-08-24 DIAGNOSIS — I10 Essential (primary) hypertension: Secondary | ICD-10-CM

## 2022-08-24 DIAGNOSIS — I2510 Atherosclerotic heart disease of native coronary artery without angina pectoris: Secondary | ICD-10-CM

## 2022-08-30 NOTE — Telephone Encounter (Signed)
PT's daughter calls today following up on this request. I informed her that it the reason for denial had been "refill not appropriate". With this being prescribed to her after her hospital visit PT's daughter is unsure as to if this was just a temporary script for her or not. They would like to be notified when possible as to if they should pursue getting this refilled or not.  CB: [phone]

## 2022-09-04 ENCOUNTER — Other Ambulatory Visit: Payer: Self-pay | Admitting: Internal Medicine

## 2022-09-04 DIAGNOSIS — I10 Essential (primary) hypertension: Secondary | ICD-10-CM

## 2022-09-04 DIAGNOSIS — I2510 Atherosclerotic heart disease of native coronary artery without angina pectoris: Secondary | ICD-10-CM

## 2022-09-09 ENCOUNTER — Telehealth: Payer: Self-pay | Admitting: Internal Medicine

## 2022-09-09 DIAGNOSIS — I2510 Atherosclerotic heart disease of native coronary artery without angina pectoris: Secondary | ICD-10-CM

## 2022-09-09 DIAGNOSIS — I10 Essential (primary) hypertension: Secondary | ICD-10-CM

## 2022-09-09 MED ORDER — ROSUVASTATIN CALCIUM 20 MG PO TABS: 20.0000 mg | ORAL_TABLET | Freq: Every day | ORAL | 0 refills | Status: DC

## 2022-09-09 MED ORDER — CARVEDILOL 6.25 MG PO TABS: 6.2500 mg | ORAL_TABLET | Freq: Two times a day (BID) | ORAL | 0 refills | Status: DC

## 2022-09-09 NOTE — Telephone Encounter (Signed)
Prescription Request  09/09/2022  LOV: 06/19/2022  What is the name of the medication or equipment?  rosuvastatin (CRESTOR) 20 MG tablet  aspirin EC 81 MG tablet   carvedilol (COREG) 123XX123 MG tablet   Folic Acid  Ezetimibe (Zetia) Have you contacted your pharmacy to request a refill? Yes   Which pharmacy would you like this sent to? PT is not using the [facility] anymore. PT would like all new scripts be sent to the [facility] on file.  [place], [place] [address] Phone: [phone] Fax: [phone]    Patient notified that their request is being sent to the clinical staff for review and that they should receive a response within 2 business days.   Please advise at [facility] [phone]   PT also noted she wasn't sure exactly what all medications she was needing to take and was wanting some advising on that.

## 2022-09-20 ENCOUNTER — Ambulatory Visit (INDEPENDENT_AMBULATORY_CARE_PROVIDER_SITE_OTHER): Payer: Medicare Other

## 2022-09-20 DIAGNOSIS — D510 Vitamin B12 deficiency anemia due to intrinsic factor deficiency: Secondary | ICD-10-CM | POA: Diagnosis not present

## 2022-09-20 MED ORDER — CYANOCOBALAMIN 1000 MCG/ML IJ SOLN
1000.0000 ug | Freq: Once | INTRAMUSCULAR | Status: AC
  Administered 2022-09-20: 1000 ug via INTRAMUSCULAR

## 2022-09-20 NOTE — Progress Notes (Signed)
Pt was given B12 w/o any complications. 

## 2022-10-01 NOTE — Progress Notes (Signed)
Remote pacemaker transmission.   

## 2022-10-21 ENCOUNTER — Ambulatory Visit: Payer: Medicare Other

## 2022-10-23 ENCOUNTER — Ambulatory Visit (INDEPENDENT_AMBULATORY_CARE_PROVIDER_SITE_OTHER): Payer: Medicare Other

## 2022-10-23 DIAGNOSIS — D510 Vitamin B12 deficiency anemia due to intrinsic factor deficiency: Secondary | ICD-10-CM

## 2022-10-23 MED ORDER — CYANOCOBALAMIN 1000 MCG/ML IJ SOLN
1000.0000 ug | Freq: Once | INTRAMUSCULAR | Status: AC
  Administered 2022-10-23: 1000 ug via INTRAMUSCULAR

## 2022-10-23 NOTE — Progress Notes (Signed)
Pt here for monthly B12 injection per   B12 given IM and pt tolerated injection well.  Pt responded well

## 2022-11-18 ENCOUNTER — Ambulatory Visit (INDEPENDENT_AMBULATORY_CARE_PROVIDER_SITE_OTHER): Payer: Medicare Other

## 2022-11-18 DIAGNOSIS — I441 Atrioventricular block, second degree: Secondary | ICD-10-CM

## 2022-11-20 DIAGNOSIS — H401132 Primary open-angle glaucoma, bilateral, moderate stage: Secondary | ICD-10-CM | POA: Diagnosis not present

## 2022-11-20 DIAGNOSIS — H2513 Age-related nuclear cataract, bilateral: Secondary | ICD-10-CM | POA: Diagnosis not present

## 2022-11-20 LAB — CUP PACEART REMOTE DEVICE CHECK
Battery Remaining Longevity: 132 mo
Battery Remaining Percentage: 100 %
Brady Statistic RA Percent Paced: 22 %
Brady Statistic RV Percent Paced: 31 %
Date Time Interrogation Session: 20240611163100
Implantable Lead Connection Status: 753985
Implantable Lead Connection Status: 753985
Implantable Lead Connection Status: 753985
Implantable Lead Implant Date: 20230607
Implantable Lead Implant Date: 20230607
Implantable Lead Implant Date: 20230607
Implantable Lead Location: 753858
Implantable Lead Location: 753859
Implantable Lead Location: 753860
Implantable Lead Model: 7840
Implantable Lead Model: 7841
Implantable Lead Model: 7842
Implantable Lead Serial Number: 1061320
Implantable Lead Serial Number: 1169091
Implantable Lead Serial Number: 1287909
Implantable Pulse Generator Implant Date: 20230607
Lead Channel Impedance Value: 445 Ohm
Lead Channel Impedance Value: 615 Ohm
Lead Channel Pacing Threshold Amplitude: 0.5 V
Lead Channel Pacing Threshold Amplitude: 1 V
Lead Channel Pacing Threshold Pulse Width: 0.4 ms
Lead Channel Pacing Threshold Pulse Width: 0.4 ms
Lead Channel Setting Pacing Amplitude: 0.1 V
Lead Channel Setting Pacing Amplitude: 2 V
Lead Channel Setting Pacing Amplitude: 2.5 V
Lead Channel Setting Pacing Pulse Width: 0.1 ms
Lead Channel Setting Pacing Pulse Width: 0.4 ms
Lead Channel Setting Sensing Sensitivity: 2.5 mV
Lead Channel Setting Sensing Sensitivity: 2.5 mV
Pulse Gen Serial Number: 720955
Zone Setting Status: 755011

## 2022-11-25 ENCOUNTER — Ambulatory Visit: Payer: Medicare Other

## 2022-12-11 NOTE — Progress Notes (Signed)
Remote pacemaker transmission.   

## 2022-12-24 ENCOUNTER — Telehealth: Payer: Self-pay | Admitting: Radiology

## 2022-12-24 NOTE — Telephone Encounter (Signed)
Left voice mail for patient to call back at [phone] to schedule Medicare Annual Wellness Visit    Last AWV:  no hx of AWV   Please schedule Sequential/Initial AWV with [facility]   Lalita K. CMA   

## 2023-01-09 ENCOUNTER — Encounter: Payer: Self-pay | Admitting: Internal Medicine

## 2023-01-09 ENCOUNTER — Ambulatory Visit: Payer: Medicare Other | Admitting: Internal Medicine

## 2023-01-09 VITALS — BP 142/78 | HR 68 | Temp 97.6°F | Resp 16 | Ht 62.0 in | Wt 131.0 lb

## 2023-01-09 DIAGNOSIS — E538 Deficiency of other specified B group vitamins: Secondary | ICD-10-CM | POA: Diagnosis not present

## 2023-01-09 DIAGNOSIS — E785 Hyperlipidemia, unspecified: Secondary | ICD-10-CM

## 2023-01-09 DIAGNOSIS — D510 Vitamin B12 deficiency anemia due to intrinsic factor deficiency: Secondary | ICD-10-CM

## 2023-01-09 DIAGNOSIS — I10 Essential (primary) hypertension: Secondary | ICD-10-CM | POA: Diagnosis not present

## 2023-01-09 DIAGNOSIS — E519 Thiamine deficiency, unspecified: Secondary | ICD-10-CM | POA: Diagnosis not present

## 2023-01-09 DIAGNOSIS — I2510 Atherosclerotic heart disease of native coronary artery without angina pectoris: Secondary | ICD-10-CM

## 2023-01-09 DIAGNOSIS — D538 Other specified nutritional anemias: Secondary | ICD-10-CM

## 2023-01-09 DIAGNOSIS — K5904 Chronic idiopathic constipation: Secondary | ICD-10-CM | POA: Diagnosis not present

## 2023-01-09 LAB — CBC WITH DIFFERENTIAL/PLATELET
Basophils Absolute: 0 10*3/uL (ref 0.0–0.1)
Basophils Relative: 1.3 % (ref 0.0–3.0)
Eosinophils Absolute: 0.1 10*3/uL (ref 0.0–0.7)
Eosinophils Relative: 1.8 % (ref 0.0–5.0)
HCT: 39.6 % (ref 36.0–46.0)
Hemoglobin: 13 g/dL (ref 12.0–15.0)
Lymphocytes Relative: 26.1 % (ref 12.0–46.0)
Lymphs Abs: 1 10*3/uL (ref 0.7–4.0)
MCHC: 32.8 g/dL (ref 30.0–36.0)
MCV: 95.5 fl (ref 78.0–100.0)
Monocytes Absolute: 0.4 10*3/uL (ref 0.1–1.0)
Monocytes Relative: 11.1 % (ref 3.0–12.0)
Neutro Abs: 2.3 10*3/uL (ref 1.4–7.7)
Neutrophils Relative %: 59.7 % (ref 43.0–77.0)
Platelets: 158 10*3/uL (ref 150.0–400.0)
RBC: 4.14 Mil/uL (ref 3.87–5.11)
RDW: 14 % (ref 11.5–15.5)
WBC: 3.8 10*3/uL — ABNORMAL LOW (ref 4.0–10.5)

## 2023-01-09 LAB — HEPATIC FUNCTION PANEL
ALT: 11 U/L (ref 0–35)
AST: 18 U/L (ref 0–37)
Albumin: 4.5 g/dL (ref 3.5–5.2)
Alkaline Phosphatase: 79 U/L (ref 39–117)
Bilirubin, Direct: 0.1 mg/dL (ref 0.0–0.3)
Total Bilirubin: 0.6 mg/dL (ref 0.2–1.2)
Total Protein: 7.6 g/dL (ref 6.0–8.3)

## 2023-01-09 LAB — TSH: TSH: 4.19 u[IU]/mL (ref 0.35–5.50)

## 2023-01-09 LAB — BASIC METABOLIC PANEL
BUN: 19 mg/dL (ref 6–23)
CO2: 29 mEq/L (ref 19–32)
Calcium: 9.9 mg/dL (ref 8.4–10.5)
Chloride: 105 mEq/L (ref 96–112)
Creatinine, Ser: 0.72 mg/dL (ref 0.40–1.20)
GFR: 75.28 mL/min (ref >60.00)
Glucose, Bld: 92 mg/dL (ref 70–99)
Potassium: 4.4 mEq/L (ref 3.5–5.1)
Sodium: 140 mEq/L (ref 135–145)

## 2023-01-09 LAB — MAGNESIUM: Magnesium: 2 mg/dL (ref 1.5–2.5)

## 2023-01-09 MED ORDER — CARVEDILOL 6.25 MG PO TABS: 6.2500 mg | ORAL_TABLET | Freq: Two times a day (BID) | ORAL | 1 refills | Status: DC

## 2023-01-09 MED ORDER — LUBIPROSTONE 24 MCG PO CAPS: 24.0000 ug | ORAL_CAPSULE | Freq: Two times a day (BID) | ORAL | 1 refills | Status: AC

## 2023-01-09 MED ORDER — ROSUVASTATIN CALCIUM 20 MG PO TABS: 20.0000 mg | ORAL_TABLET | Freq: Every day | ORAL | 1 refills | Status: DC

## 2023-01-09 MED ORDER — CYANOCOBALAMIN 1000 MCG/ML IJ SOLN
1000.0000 ug | Freq: Once | INTRAMUSCULAR | Status: AC
Start: 2023-01-09 — End: 2023-01-09
  Administered 2023-01-09: 1000 ug via INTRAMUSCULAR

## 2023-01-09 NOTE — Patient Instructions (Signed)
Hypertension, Adult High blood pressure (hypertension) is when the force of blood pumping through the arteries is too strong. The arteries are the blood vessels that carry blood from the heart throughout the body. Hypertension forces the heart to work harder to pump blood and may cause arteries to become narrow or stiff. Untreated or uncontrolled hypertension can lead to a heart attack, heart failure, a stroke, kidney disease, and other problems. A blood pressure reading consists of a higher number over a lower number. Ideally, your blood pressure should be below 120/80. The first ("top") number is called the systolic pressure. It is a measure of the pressure in your arteries as your heart beats. The second ("bottom") number is called the diastolic pressure. It is a measure of the pressure in your arteries as the heart relaxes. What are the causes? The exact cause of this condition is not known. There are some conditions that result in high blood pressure. What increases the risk? Certain factors may make you more likely to develop high blood pressure. Some of these risk factors are under your control, including: Smoking. Not getting enough exercise or physical activity. Being overweight. Having too much fat, sugar, calories, or salt (sodium) in your diet. Drinking too much alcohol. Other risk factors include: Having a personal history of heart disease, diabetes, high cholesterol, or kidney disease. Stress. Having a family history of high blood pressure and high cholesterol. Having obstructive sleep apnea. Age. The risk increases with age. What are the signs or symptoms? High blood pressure may not cause symptoms. Very high blood pressure (hypertensive crisis) may cause: Headache. Fast or irregular heartbeats (palpitations). Shortness of breath. Nosebleed. Nausea and vomiting. Vision changes. Severe chest pain, dizziness, and seizures. How is this diagnosed? This condition is diagnosed by  measuring your blood pressure while you are seated, with your arm resting on a flat surface, your legs uncrossed, and your feet flat on the floor. The cuff of the blood pressure monitor will be placed directly against the skin of your upper arm at the level of your heart. Blood pressure should be measured at least twice using the same arm. Certain conditions can cause a difference in blood pressure between your right and left arms. If you have a high blood pressure reading during one visit or you have normal blood pressure with other risk factors, you may be asked to: Return on a different day to have your blood pressure checked again. Monitor your blood pressure at home for 1 week or longer. If you are diagnosed with hypertension, you may have other blood or imaging tests to help your health care provider understand your overall risk for other conditions. How is this treated? This condition is treated by making healthy lifestyle changes, such as eating healthy foods, exercising more, and reducing your alcohol intake. You may be referred for counseling on a healthy diet and physical activity. Your health care provider may prescribe medicine if lifestyle changes are not enough to get your blood pressure under control and if: Your systolic blood pressure is above 130. Your diastolic blood pressure is above 80. Your personal target blood pressure may vary depending on your medical conditions, your age, and other factors. Follow these instructions at home: Eating and drinking  Eat a diet that is high in fiber and potassium, and low in sodium, added sugar, and fat. An example of this eating plan is called the DASH diet. DASH stands for Dietary Approaches to Stop Hypertension. To eat this way: Eat   plenty of fresh fruits and vegetables. Try to fill one half of your plate at each meal with fruits and vegetables. Eat whole grains, such as whole-wheat pasta, brown rice, or whole-grain bread. Fill about one  fourth of your plate with whole grains. Eat or drink low-fat dairy products, such as skim milk or low-fat yogurt. Avoid fatty cuts of meat, processed or cured meats, and poultry with skin. Fill about one fourth of your plate with lean proteins, such as fish, chicken without skin, beans, eggs, or tofu. Avoid pre-made and processed foods. These tend to be higher in sodium, added sugar, and fat. Reduce your daily sodium intake. Many people with hypertension should eat less than 1,500 mg of sodium a day. Do not drink alcohol if: Your health care provider tells you not to drink. You are pregnant, may be pregnant, or are planning to become pregnant. If you drink alcohol: Limit how much you have to: 0-1 drink a day for women. 0-2 drinks a day for men. Know how much alcohol is in your drink. In the [place] one drink equals one 12 oz bottle of beer (355 mL), one 5 oz glass of wine (148 mL), or one 1 oz glass of hard liquor (44 mL). Lifestyle  Work with your health care provider to maintain a healthy body weight or to lose weight. Ask what an ideal weight is for you. Get at least 30 minutes of exercise that causes your heart to beat faster (aerobic exercise) most days of the week. Activities may include walking, swimming, or biking. Include exercise to strengthen your muscles (resistance exercise), such as Pilates or lifting weights, as part of your weekly exercise routine. Try to do these types of exercises for 30 minutes at least 3 days a week. Do not use any products that contain nicotine or tobacco. These products include cigarettes, chewing tobacco, and vaping devices, such as e-cigarettes. If you need help quitting, ask your health care provider. Monitor your blood pressure at home as told by your health care provider. Keep all follow-up visits. This is important. Medicines Take over-the-counter and prescription medicines only as told by your health care provider. Follow directions carefully. Blood  pressure medicines must be taken as prescribed. Do not skip doses of blood pressure medicine. Doing this puts you at risk for problems and can make the medicine less effective. Ask your health care provider about side effects or reactions to medicines that you should watch for. Contact a health care provider if you: Think you are having a reaction to a medicine you are taking. Have headaches that keep coming back (recurring). Feel dizzy. Have swelling in your ankles. Have trouble with your vision. Get help right away if you: Develop a severe headache or confusion. Have unusual weakness or numbness. Feel faint. Have severe pain in your chest or abdomen. Vomit repeatedly. Have trouble breathing. These symptoms may be an emergency. Get help right away. Call 911. Do not wait to see if the symptoms will go away. Do not drive yourself to the hospital. Summary Hypertension is when the force of blood pumping through your arteries is too strong. If this condition is not controlled, it may put you at risk for serious complications. Your personal target blood pressure may vary depending on your medical conditions, your age, and other factors. For most people, a normal blood pressure is less than 120/80. Hypertension is treated with lifestyle changes, medicines, or a combination of both. Lifestyle changes include losing weight, eating a healthy,   low-sodium diet, exercising more, and limiting alcohol. This information is not intended to replace advice given to you by your health care provider. Make sure you discuss any questions you have with your health care provider. Document Revised: 04/03/2021 Document Reviewed: 04/03/2021 Elsevier Patient Education  2024 [employer]  

## 2023-01-09 NOTE — Progress Notes (Signed)
Subjective:  Patient ID: Rita Harding, female    DOB: Nov 15, 1933  Age: 89 y.o. MRN: [id number]  CC: Hypertension, Hyperlipidemia, Coronary Artery Disease, and Anemia   HPI Rita Harding presents for f/up ---  Discussed the use of AI scribe software for clinical note transcription with the patient, who gave verbal consent to proceed.  History of Present Illness   The patient, with a history of cardiac disease managed with a pacemaker, presented for a follow-up visit after an unintentional discontinuation of their medications due to a household mishap. The pacemaker, implanted over a year ago, has been functioning well without any associated symptoms such as chest pain, shortness of breath, or lower extremity swelling.  The patient has been experiencing constipation, the severity and frequency of which is unclear. She was unsure if the prescribed Amitiza was providing any relief. She also reported a missed B12 injection due to an out-of-town emergency, but otherwise, there was no mention of regular vitamin supplementation.  The patient described a fairly active lifestyle, including household chores and gardening, but did not engage in any structured exercise regimen. She expressed concern about recent weight gain but denied experiencing any dizziness during their activities.       Outpatient Medications Prior to Visit  Medication Sig Dispense Refill   aspirin EC 81 MG tablet Take 81 mg by mouth daily.     benazepril (LOTENSIN) 5 MG tablet Take 0.5 tablets (2.5 mg total) by mouth daily. 45 tablet 3   carvedilol (COREG) 6.25 MG tablet Take 1 tablet (6.25 mg total) by mouth 2 (two) times daily with a meal. 60 tablet 0   lubiprostone (AMITIZA) 24 MCG capsule Take 1 capsule (24 mcg total) by mouth 2 (two) times daily with a meal. 180 capsule 1   rosuvastatin (CRESTOR) 20 MG tablet Take 1 tablet (20 mg total) by mouth daily. 90 tablet 0   No facility-administered medications prior to  visit.    ROS Review of Systems  Constitutional:  Negative for appetite change, diaphoresis, fatigue and unexpected weight change.  HENT: Negative.    Eyes: Negative.   Respiratory: Negative.  Negative for cough, chest tightness, shortness of breath, wheezing and stridor.   Cardiovascular:  Negative for chest pain, palpitations and leg swelling.  Gastrointestinal:  Positive for constipation. Negative for abdominal pain, blood in stool, diarrhea, nausea and vomiting.  Endocrine: Negative.   Genitourinary: Negative.  Negative for difficulty urinating.  Musculoskeletal: Negative.  Negative for arthralgias, joint swelling and myalgias.  Skin: Negative.  Negative for color change and pallor.  Allergic/Immunologic: Negative.   Neurological: Negative.  Negative for dizziness.  Hematological:  Negative for adenopathy. Does not bruise/bleed easily.  Psychiatric/Behavioral: Negative.      Objective:  BP (!) 142/78 (BP Location: Left Arm, Patient Position: Sitting, Cuff Size: Normal)   Pulse 68   Temp 97.6 F (36.4 C) (Oral)   Resp 16   Ht 5\' 2"  (1.575 m)   Wt 131 lb (59.4 kg)   SpO2 95%   BMI 23.96 kg/m   BP Readings from Last 3 Encounters:  01/09/23 (!) 142/78  06/19/22 134/78  02/19/22 104/62    Wt Readings from Last 3 Encounters:  01/09/23 131 lb (59.4 kg)  06/19/22 128 lb (58.1 kg)  02/19/22 127 lb 6.4 oz (57.8 kg)    Physical Exam Vitals reviewed.  Constitutional:      Appearance: Normal appearance.  HENT:     Mouth/Throat:     Mouth:  Mucous membranes are moist.  Eyes:     General: No scleral icterus.    Conjunctiva/sclera: Conjunctivae normal.  Cardiovascular:     Rate and Rhythm: Normal rate and regular rhythm.     Heart sounds: Murmur heard.     Systolic murmur is present with a grade of 1/6.     No diastolic murmur is present.     No gallop.  Pulmonary:     Effort: Pulmonary effort is normal.     Breath sounds: No stridor. No wheezing, rhonchi or rales.   Abdominal:     General: Abdomen is flat. Bowel sounds are normal. There is no distension.     Palpations: There is no hepatomegaly, splenomegaly or mass.     Tenderness: There is no abdominal tenderness. There is no guarding.  Musculoskeletal:        General: Normal range of motion.     Cervical back: Neck supple.     Right lower leg: No edema.     Left lower leg: No edema.  Lymphadenopathy:     Cervical: No cervical adenopathy.  Skin:    General: Skin is warm and dry.  Neurological:     General: No focal deficit present.     Mental Status: She is alert.  Psychiatric:        Mood and Affect: Mood normal.        Behavior: Behavior normal.     Lab Results  Component Value Date   WBC 3.8 (L) 01/09/2023   HGB 13.0 01/09/2023   HCT 39.6 01/09/2023   PLT 158.0 01/09/2023   GLUCOSE 92 01/09/2023   CHOL 195 06/19/2022   TRIG 131.0 06/19/2022   HDL 83.20 06/19/2022   LDLCALC 85 06/19/2022   ALT 11 01/09/2023   AST 18 01/09/2023   NA 140 01/09/2023   K 4.4 01/09/2023   CL 105 01/09/2023   CREATININE 0.72 01/09/2023   BUN 19 01/09/2023   CO2 29 01/09/2023   TSH 4.19 01/09/2023   INR 1.34 05/27/2015   HGBA1C 5.8 (H) 11/13/2021    CUP PACEART INCLINIC DEVICE CHECK  Result Date: 11/28/2021 Wound check appointment. Steri-strips removed. Wound without redness or edema. Incision edges approximated, wound well healed. Normal device function. Thresholds, sensing, and impedances consistent with implant measurements. Device programmed at 3.5V/auto capture programmed on for extra safety margin until 3 month visit. Histogram distribution appropriate for patient and level of activity. No mode switches or high ventricular rates noted. Patient educated about wound care, arm mobility, lifting restrictions. ROV in 3 months with implanting physician.Rita Harding, BSN, RN   Assessment & Plan:  Anemia due to acquired thiamine deficiency -     CBC with Differential/Platelet; Future  Anemia  due to zinc deficiency -     CBC with Differential/Platelet; Future  Dietary folate deficiency -     CBC with Differential/Platelet; Future  Essential hypertension- Her BP is adequately well controlled. -     Basic metabolic panel; Future  Vitamin B12 deficiency anemia due to intrinsic factor deficiency -     CBC with Differential/Platelet; Future -     Cyanocobalamin  Coronary artery disease, non-occlusive -     Rosuvastatin Calcium; Take 1 tablet (20 mg total) by mouth daily.  Dispense: 90 tablet; Refill: 1  Hyperlipidemia with target LDL less than 100 - LDL goal achieved. Doing well on the statin  -     Rosuvastatin Calcium; Take 1 tablet (20 mg total) by mouth daily.  Dispense: 90 tablet; Refill: 1 -     Hepatic function panel; Future  Chronic idiopathic constipation -     Lubiprostone; Take 1 capsule (24 mcg total) by mouth 2 (two) times daily with a meal.  Dispense: 180 capsule; Refill: 1 -     Magnesium; Future -     TSH; Future  Primary hypertension -     Carvedilol; Take 1 tablet (6.25 mg total) by mouth 2 (two) times daily with a meal.  Dispense: 180 tablet; Refill: 1 -     TSH; Future  Coronary artery disease involving native coronary artery of native heart without angina pectoris -     Carvedilol; Take 1 tablet (6.25 mg total) by mouth 2 (two) times daily with a meal.  Dispense: 180 tablet; Refill: 1     Follow-up: Return in about 6 months (around 07/12/2023).  Sanda Linger, MD

## 2023-01-30 ENCOUNTER — Ambulatory Visit (INDEPENDENT_AMBULATORY_CARE_PROVIDER_SITE_OTHER): Payer: Medicare Other

## 2023-01-30 VITALS — BP 120/70 | Ht 63.25 in | Wt 133.2 lb

## 2023-01-30 DIAGNOSIS — Z1231 Encounter for screening mammogram for malignant neoplasm of breast: Secondary | ICD-10-CM | POA: Diagnosis not present

## 2023-01-30 DIAGNOSIS — Z780 Asymptomatic menopausal state: Secondary | ICD-10-CM | POA: Diagnosis not present

## 2023-01-30 DIAGNOSIS — Z0000 Encounter for general adult medical examination without abnormal findings: Secondary | ICD-10-CM | POA: Diagnosis not present

## 2023-01-30 NOTE — Progress Notes (Signed)
Subjective:   Rita Harding is a 89 y.o. female who presents for an Initial Medicare Annual Wellness Visit.  Visit Complete: In person   Review of Systems    Cardiac Risk Factors include: advanced age (>4men, >68 women);hypertension;dyslipidemia;Other (see comment), Risk factor comments: CAD     Objective:    Today's Vitals   01/30/23 1328  Weight: 133 lb 3.2 oz (60.4 kg)  Height: 5' 3.25" (1.607 m)   Body mass index is 23.41 kg/m.     01/30/2023    1:34 PM 11/27/2021   11:17 AM 11/12/2021   10:00 PM 11/12/2021    3:57 PM 07/22/2019    1:19 AM 08/21/2018   12:05 PM 08/21/2018   12:30 AM  Advanced Directives  Does Patient Have a Medical Advance Directive? Yes Yes Yes Yes No Yes No  Type of Estate agent of [place];Living will Living will Living will Living will  Healthcare Power of Attorney   Does patient want to make changes to medical advance directive?   No - Guardian declined No - Patient declined  No - Patient declined   Copy of Healthcare Power of Attorney in Chart? No - copy requested     No - copy requested   Would patient like information on creating a medical advance directive?   No - Patient declined No - Patient declined No - Patient declined  No - Patient declined    Current Medications (verified) Outpatient Encounter Medications as of 01/30/2023  Medication Sig   aspirin EC 81 MG tablet Take 81 mg by mouth daily.   benazepril (LOTENSIN) 5 MG tablet Take 0.5 tablets (2.5 mg total) by mouth daily.   carvedilol (COREG) 6.25 MG tablet Take 1 tablet (6.25 mg total) by mouth 2 (two) times daily with a meal.   lubiprostone (AMITIZA) 24 MCG capsule Take 1 capsule (24 mcg total) by mouth 2 (two) times daily with a meal.   rosuvastatin (CRESTOR) 20 MG tablet Take 1 tablet (20 mg total) by mouth daily.   No facility-administered encounter medications on file as of 01/30/2023.    Allergies (verified) Codeine   History: Past Medical History:   Diagnosis Date   Coronary artery disease 2007   non-critcal coronary single disease   History of DVT (deep vein thrombosis)    "LLE"; BEEN OFF  COUMADIN SINCE Sept 2012   Hyperlipidemia    Hypertension    MI (myocardial infarction) (HCC) 2007   Thought to be takotsubo cardiomyopathy   Takotsubo cardiomyopathy 2007   Result with normal EF as OF 2013   Past Surgical History:  Procedure Laterality Date   AMPUTATION Left 08/21/2018   Procedure: REVISION AMPUTATION LEFT RING FINGER AND INCISION AND DRAINAGE LEFT LONG FINGER;  Surgeon: Betha Loa, MD;  Location: [facility];  Service: Orthopedics;  Laterality: Left;   APPENDECTOMY     CARDIAC CATHETERIZATION  01/03/2006    50-60% LAD with some myocardial  bridging; NON ISCHEMIC  CARDIOMYOPATHY  with EF  30 to 35% no evidence of infarct -- anteroapical and inferapical wall motin abnormalities compatilble with Takotsubo-type syndrome   DOPPLER LEFT LOWER EXTREMITY ([facility] HX) Left 01/29/2011   left saphenous vein -small amt of chroinc appearing calicification on anterior wall that was nonobstructing,mildly abnormal    DOPPLER RIGHT ADDITIONAL EXTR ([facility] HX) Right 01/29/2011   FIBULA FRACTURE SURGERY Left 2009   UNDER CARE DR Brett Canales NORRIS   FRACTURE SURGERY     NM MYOVIEW  LTD  02/21/2012   low risk  and normal EF 70% or greater   PACEMAKER IMPLANT N/A 11/14/2021   Procedure: PACEMAKER IMPLANT;  Surgeon: Regan Lemming, MD;  Location: [facility];  Service: Cardiovascular;  Laterality: N/A;   TONSILLECTOMY     TRANSTHORACIC ECHOCARDIOGRAM  07/31/2010   no MVP ,NORMAL SYSTOLIC FUNCTION (EF >55%). Mild Ao Sclerosis.     Family History  Problem Relation Age of Onset   Cancer Mother    Heart attack Father    Cancer Maternal Grandmother    Heart disease Paternal Grandmother    Heart disease Paternal Grandfather    Social History   Socioeconomic History   Marital status: Widowed    Spouse name: Not on file    Number of children: 3   Years of education: Not on file   Highest education level: Not on file  Occupational History   Occupation: Retired  Tobacco Use   Smoking status: Former    Current packs/day: 1.00    Average packs/day: 1 pack/day for 15.0 years (15.0 ttl pk-yrs)    Types: Cigarettes   Smokeless tobacco: Never   Tobacco comments:    "quit smoking in the 1960s"  Vaping Use   Vaping status: Never Used  Substance and Sexual Activity   Alcohol use: Yes    Alcohol/week: 25.0 standard drinks of alcohol    Types: 25 Glasses of wine per week   Drug use: No   Sexual activity: Not Currently  Other Topics Concern   Not on file  Social History Narrative   Not on file   Social Determinants of Health   Financial Resource Strain: Not on file  Food Insecurity: Not on file  Transportation Needs: Not on file  Physical Activity: Not on file  Stress: Not on file  Social Connections: Not on file    Tobacco Counseling Counseling given: Not Answered Tobacco comments: "quit smoking in the 1960s"   Clinical Intake:  Pre-visit preparation completed: Yes  Pain : No/denies pain     BMI - recorded: 23.41 Nutritional Status: BMI of 19-24  Normal Nutritional Risks: None Diabetes: No  How often do you need to have someone help you when you read instructions, pamphlets, or other written materials from your doctor or pharmacy?: 1 - Never  Interpreter Needed?: No  Information entered by :: Archita Lomeli, RMA   Activities of Daily Living    01/30/2023    1:31 PM  In your present state of health, do you have any difficulty performing the following activities:  Hearing? 0  Vision? 0  Difficulty concentrating or making decisions? 0  Walking or climbing stairs? 0  Dressing or bathing? 0  Doing errands, shopping? 0  Preparing Food and eating ? N  Using the Toilet? N  In the past six months, have you accidently leaked urine? Y  Do you have problems with loss of bowel control? N   Managing your Medications? N  Managing your Finances? N  Housekeeping or managing your Housekeeping? N    Patient Care Team: Etta Grandchild, MD as PCP - General (Internal Medicine) Marykay Lex, MD as PCP - Cardiology (Cardiology) Regan Lemming, MD as PCP - Electrophysiology (Cardiology) Maris Berger, MD as Consulting Physician (Ophthalmology)  Indicate any recent Medical Services you may have received from other than [facility] providers in the past year (date may be approximate).     Assessment:   This is a routine wellness examination for  Rita Harding.  Hearing/Vision screen Hearing Screening - Comments:: Denies hearing difficulties    Dietary issues and exercise activities discussed:     Goals Addressed               This Visit's Progress     Patient Stated (pt-stated)        Staying healthy      Depression Screen    01/09/2023    9:34 AM 12/17/2021   11:23 AM 01/09/2021    1:31 PM  PHQ 2/9 Scores  PHQ - 2 Score 0 1 0  PHQ- 9 Score 4 6     Fall Risk    01/30/2023    1:34 PM 01/09/2023    9:34 AM 12/17/2021   11:23 AM  Fall Risk   Falls in the past year? 0 1 1  Number falls in past yr: 0 0 0  Injury with Fall? 0 0 0  Risk for fall due to : No Fall Risks No Fall Risks   Follow up Falls prevention discussed;Falls evaluation completed Falls evaluation completed     MEDICARE RISK AT HOME: Medicare Risk at Home Any stairs in or around the home?: Yes (3 in front and 3 in the back) If so, are there any without handrails?: Yes Home free of loose throw rugs in walkways, pet beds, electrical cords, etc?: Yes Adequate lighting in your home to reduce risk of falls?: Yes Life alert?: No Use of a cane, walker or w/c?: No Grab bars in the bathroom?: Yes Shower chair or bench in shower?: Yes Elevated toilet seat or a handicapped toilet?: No  TIMED UP AND GO:  Was the test performed? Yes  Length of time to ambulate 10 feet: 15 sec Gait slow and steady  without use of assistive device    Cognitive Function:        01/30/2023    1:35 PM  6CIT Screen  What Year? 0 points  What month? 0 points  What time? 0 points  Count back from 20 0 points    Immunizations Immunization History  Administered Date(s) Administered   Influenza, High Dose Seasonal PF 03/27/2019, 04/03/2022   Influenza-Unspecified 03/10/2021   Moderna SARS-COV2 Booster Vaccination 04/03/2022   PNEUMOCOCCAL CONJUGATE-20 06/06/2021   Tdap 08/21/2018   Zoster Recombinant(Shingrix) 05/29/2022    TDAP status: Up to date  Flu Vaccine status: Due, Education has been provided regarding the importance of this vaccine. Advised may receive this vaccine at local pharmacy or Health Dept. Aware to provide a copy of the vaccination record if obtained from local pharmacy or Health Dept. Verbalized acceptance and understanding.  Pneumococcal vaccine status: Up to date  Covid-19 vaccine status: Information provided on how to obtain vaccines.   Qualifies for Shingles Vaccine? Yes   Zostavax completed Yes   Shingrix Completed?: Yes  Screening Tests Health Maintenance  Topic Date Due   COVID-19 Vaccine (1 - 2023-24 season) 05/29/2022   Zoster Vaccines- Shingrix (2 of 2) 07/24/2022   INFLUENZA VACCINE  01/09/2023   DEXA SCAN  01/09/2024 (Originally 10/18/2000)   Medicare Annual Wellness (AWV)  01/30/2024   DTaP/Tdap/Td (2 - Td or Tdap) 08/20/2028   Pneumonia Vaccine 59+ Years old  Completed   HPV VACCINES  Aged Out    Health Maintenance  Health Maintenance Due  Topic Date Due   COVID-19 Vaccine (1 - 2023-24 season) 05/29/2022   Zoster Vaccines- Shingrix (2 of 2) 07/24/2022   INFLUENZA VACCINE  01/09/2023    Colorectal  cancer screening: No longer required.   Mammogram status: Ordered 01/30/2023. Pt provided with contact info and advised to call to schedule appt.   Bone Density status: Ordered 01/30/2023. Pt provided with contact info and advised to call to schedule  appt.  Lung Cancer Screening: (Low Dose CT Chest recommended if Age 19-80 years, 20 pack-year currently smoking OR have quit w/in 15years.) does not qualify.   Lung Cancer Screening Referral: N/A  Additional Screening:  Hepatitis C Screening: does not qualify;   Vision Screening: Recommended annual ophthalmology exams for early detection of glaucoma and other disorders of the eye. Is the patient up to date with their annual eye exam?  Yes  Who is the provider or what is the name of the office in which the patient attends annual eye exams?Dr. Charlotte Sanes If pt is not established with a provider, would they like to be referred to a provider to establish care? No .   Dental Screening: Recommended annual dental exams for proper oral hygiene   Community Resource Referral / Chronic Care Management: CRR required this visit?  No   CCM required this visit?  No     Plan:     I have personally reviewed and noted the following in the patient's chart:   Medical and social history Use of alcohol, tobacco or illicit drugs  Current medications and supplements including opioid prescriptions. Patient is not currently taking opioid prescriptions. Functional ability and status Nutritional status Physical activity Advanced directives List of other physicians Hospitalizations, surgeries, and ER visits in previous 12 months Vitals Screenings to include cognitive, depression, and falls Referrals and appointments  In addition, I have reviewed and discussed with patient certain preventive protocols, quality metrics, and best practice recommendations. A written personalized care plan for preventive services as well as general preventive health recommendations were provided to patient.     Chauncy Mangiaracina L Khoa Opdahl, CMA   01/30/2023   After Visit Summary: (MyChart) Due to this being a telephonic visit, the after visit summary with patients personalized plan was offered to patient via MyChart   Nurse Notes:  Patient is due for a mammogram and a DEXA.  A referral has been placed.  She will be receiving her 2nd shingrix vaccine today at [facility].  Patient has no other concerns to address today.

## 2023-01-30 NOTE — Patient Instructions (Signed)
Rita Harding , Thank you for taking time to come for your Medicare Wellness Visit. I appreciate your ongoing commitment to your health goals. Please review the following plan we discussed and let me know if I can assist you in the future.   Referrals/Orders/Follow-Ups/Clinician Recommendations: You are due for a Mammogram and Bone Density at [facility] at [phone], please call to schedule your appointment.    This is a list of the screening recommended for you and due dates:  Health Maintenance  Topic Date Due   COVID-19 Vaccine (1 - 2023-24 season) 05/29/2022   Zoster (Shingles) Vaccine (2 of 2) 07/24/2022   Flu Shot  01/09/2023   DEXA scan (bone density measurement)  01/09/2024*   Medicare Annual Wellness Visit  01/30/2024   DTaP/Tdap/Td vaccine (2 - Td or Tdap) 08/20/2028   Pneumonia Vaccine  Completed   HPV Vaccine  Aged Out  *Topic was postponed. The date shown is not the original due date.    Advanced directives: (Copy Requested) Please bring a copy of your health care power of attorney and living will to the office to be added to your chart at your convenience.  Next Medicare Annual Wellness Visit scheduled for next year: Yes

## 2023-01-31 ENCOUNTER — Other Ambulatory Visit: Payer: Self-pay | Admitting: Cardiology

## 2023-02-14 ENCOUNTER — Ambulatory Visit
Admission: RE | Admit: 2023-02-14 | Discharge: 2023-02-14 | Disposition: A | Discharge status: Home / Self Care | Payer: Medicare Other | Source: Ambulatory Visit | Attending: Internal Medicine | Admitting: Internal Medicine

## 2023-02-14 DIAGNOSIS — Z1231 Encounter for screening mammogram for malignant neoplasm of breast: Secondary | ICD-10-CM | POA: Diagnosis not present

## 2023-02-14 DIAGNOSIS — Z0000 Encounter for general adult medical examination without abnormal findings: Secondary | ICD-10-CM

## 2023-02-17 ENCOUNTER — Ambulatory Visit (INDEPENDENT_AMBULATORY_CARE_PROVIDER_SITE_OTHER): Payer: Medicare Other

## 2023-02-17 DIAGNOSIS — I441 Atrioventricular block, second degree: Secondary | ICD-10-CM

## 2023-02-20 LAB — CUP PACEART REMOTE DEVICE CHECK
Battery Remaining Longevity: 132 mo
Battery Remaining Percentage: 100 %
Brady Statistic RA Percent Paced: 22 %
Brady Statistic RV Percent Paced: 35 %
Date Time Interrogation Session: 20240912050100
Implantable Lead Connection Status: 753985
Implantable Lead Connection Status: 753985
Implantable Lead Connection Status: 753985
Implantable Lead Implant Date: 20230607
Implantable Lead Implant Date: 20230607
Implantable Lead Implant Date: 20230607
Implantable Lead Location: 753858
Implantable Lead Location: 753859
Implantable Lead Location: 753860
Implantable Lead Model: 7840
Implantable Lead Model: 7841
Implantable Lead Model: 7842
Implantable Lead Serial Number: 1061320
Implantable Lead Serial Number: 1169091
Implantable Lead Serial Number: 1287909
Implantable Pulse Generator Implant Date: 20230607
Lead Channel Impedance Value: 480 Ohm
Lead Channel Impedance Value: 645 Ohm
Lead Channel Pacing Threshold Amplitude: 0.5 V
Lead Channel Pacing Threshold Amplitude: 1 V
Lead Channel Pacing Threshold Pulse Width: 0.4 ms
Lead Channel Pacing Threshold Pulse Width: 0.4 ms
Lead Channel Setting Pacing Amplitude: 0.1 V
Lead Channel Setting Pacing Amplitude: 2 V
Lead Channel Setting Pacing Amplitude: 2.5 V
Lead Channel Setting Pacing Pulse Width: 0.1 ms
Lead Channel Setting Pacing Pulse Width: 0.4 ms
Lead Channel Setting Sensing Sensitivity: 2.5 mV
Lead Channel Setting Sensing Sensitivity: 2.5 mV
Pulse Gen Serial Number: 720955
Zone Setting Status: 755011

## 2023-03-06 NOTE — Progress Notes (Signed)
Remote pacemaker transmission.   

## 2023-05-19 ENCOUNTER — Ambulatory Visit (INDEPENDENT_AMBULATORY_CARE_PROVIDER_SITE_OTHER): Payer: Medicare Other

## 2023-05-19 DIAGNOSIS — I441 Atrioventricular block, second degree: Secondary | ICD-10-CM | POA: Diagnosis not present

## 2023-05-23 DIAGNOSIS — H5213 Myopia, bilateral: Secondary | ICD-10-CM | POA: Diagnosis not present

## 2023-05-23 DIAGNOSIS — H401132 Primary open-angle glaucoma, bilateral, moderate stage: Secondary | ICD-10-CM | POA: Diagnosis not present

## 2023-05-23 DIAGNOSIS — H2513 Age-related nuclear cataract, bilateral: Secondary | ICD-10-CM | POA: Diagnosis not present

## 2023-05-23 LAB — CUP PACEART REMOTE DEVICE CHECK
Battery Remaining Longevity: 132 mo
Battery Remaining Percentage: 100 %
Brady Statistic RA Percent Paced: 22 %
Brady Statistic RV Percent Paced: 36 %
Date Time Interrogation Session: 20241212050100
Implantable Lead Connection Status: 753985
Implantable Lead Connection Status: 753985
Implantable Lead Connection Status: 753985
Implantable Lead Implant Date: 20230607
Implantable Lead Implant Date: 20230607
Implantable Lead Implant Date: 20230607
Implantable Lead Location: 753858
Implantable Lead Location: 753859
Implantable Lead Location: 753860
Implantable Lead Model: 7840
Implantable Lead Model: 7841
Implantable Lead Model: 7842
Implantable Lead Serial Number: 1061320
Implantable Lead Serial Number: 1169091
Implantable Lead Serial Number: 1287909
Implantable Pulse Generator Implant Date: 20230607
Lead Channel Impedance Value: 494 Ohm
Lead Channel Impedance Value: 588 Ohm
Lead Channel Pacing Threshold Amplitude: 0.6 V
Lead Channel Pacing Threshold Amplitude: 1 V
Lead Channel Pacing Threshold Pulse Width: 0.4 ms
Lead Channel Pacing Threshold Pulse Width: 0.4 ms
Lead Channel Setting Pacing Amplitude: 0.1 V
Lead Channel Setting Pacing Amplitude: 2 V
Lead Channel Setting Pacing Amplitude: 2.5 V
Lead Channel Setting Pacing Pulse Width: 0.1 ms
Lead Channel Setting Pacing Pulse Width: 0.4 ms
Lead Channel Setting Sensing Sensitivity: 2.5 mV
Lead Channel Setting Sensing Sensitivity: 2.5 mV
Pulse Gen Serial Number: 720955
Zone Setting Status: 755011

## 2023-06-26 NOTE — Progress Notes (Signed)
Remote pacemaker transmission.   

## 2023-06-26 NOTE — Addendum Note (Signed)
Addended by: Geralyn Flash D on: 06/26/2023 03:30 PM   Modules accepted: Orders

## 2023-06-30 ENCOUNTER — Encounter: Payer: Self-pay | Admitting: Internal Medicine

## 2023-06-30 ENCOUNTER — Telehealth: Payer: Self-pay | Admitting: Internal Medicine

## 2023-06-30 ENCOUNTER — Ambulatory Visit (INDEPENDENT_AMBULATORY_CARE_PROVIDER_SITE_OTHER): Payer: Medicare Other | Admitting: Internal Medicine

## 2023-06-30 VITALS — BP 128/80 | HR 83 | Temp 98.3°F | Ht 63.25 in | Wt 129.0 lb

## 2023-06-30 DIAGNOSIS — J069 Acute upper respiratory infection, unspecified: Secondary | ICD-10-CM | POA: Diagnosis not present

## 2023-06-30 MED ORDER — PROMETHAZINE-DM 6.25-15 MG/5ML PO SYRP: 5.0000 mL | ORAL_SOLUTION | Freq: Four times a day (QID) | ORAL | 0 refills | Status: DC | PRN

## 2023-06-30 NOTE — Progress Notes (Signed)
   Subjective:   Patient ID: Rita Harding, female    DOB: 04/20/1934, 89 y.o.   MRN: [id number]  URI  Associated symptoms include congestion, coughing and rhinorrhea. Pertinent negatives include no ear pain, sinus pain, sneezing, sore throat or wheezing.   The patient is an 89 YO female coming in for cold symptoms. Caught from daughter. Started about 5 days ago. Overall stable not improving not worsening. Cough is persistent and making sleep difficult. No fevers or chills or SOB.   Review of Systems  Constitutional:  Positive for activity change and appetite change. Negative for chills, fatigue, fever and unexpected weight change.  HENT:  Positive for congestion, postnasal drip, rhinorrhea and sinus pressure. Negative for ear discharge, ear pain, sinus pain, sneezing, sore throat, tinnitus, trouble swallowing and voice change.   Eyes: Negative.   Respiratory:  Positive for cough. Negative for chest tightness, shortness of breath and wheezing.   Cardiovascular: Negative.   Gastrointestinal: Negative.   Musculoskeletal:  Positive for myalgias.  Neurological: Negative.     Objective:  Physical Exam Constitutional:      Appearance: She is well-developed.  HENT:     Head: Normocephalic and atraumatic.     Comments: Oropharynx with redness and clear drainage, nose with swollen turbinates, TMs normal bilaterally.  Neck:     Thyroid: No thyromegaly.  Cardiovascular:     Rate and Rhythm: Normal rate and regular rhythm.  Pulmonary:     Effort: Pulmonary effort is normal. No respiratory distress.     Breath sounds: Normal breath sounds. No wheezing or rales.  Abdominal:     Palpations: Abdomen is soft.  Musculoskeletal:        General: No tenderness.     Cervical back: Normal range of motion.  Lymphadenopathy:     Cervical: No cervical adenopathy.  Skin:    General: Skin is warm and dry.  Neurological:     Mental Status: She is alert and oriented to person, place, and time.      Vitals:   06/30/23 1026  BP: 128/80  Pulse: 83  Temp: 98.3 F (36.8 C)  TempSrc: Oral  SpO2: 99%  Weight: 129 lb (58.5 kg)  Height: 5' 3.25" (1.607 m)    Assessment & Plan:

## 2023-06-30 NOTE — Assessment & Plan Note (Signed)
Rx promethazine/dm cough syrup given allergy. She has likely viral cold without indication for antibiotics or steroids today. Counseled about expected timeline for recovery and to get back in touch if not improving as expected.

## 2023-06-30 NOTE — Telephone Encounter (Signed)
Pt's pharmacy ([facility]) states that their system is down and they don't have a projected time for it to be back up.  Please send promethazine-dextromethorphan (PROMETHAZINE-DM) 6.25-15 MG/5ML syrup  to [id number]  Phone: [phone]  Fax: [phone]

## 2023-06-30 NOTE — Patient Instructions (Signed)
We have sent in the cough medicine to use as needed.

## 2023-07-01 MED ORDER — PROMETHAZINE-DM 6.25-15 MG/5ML PO SYRP: 5.0000 mL | ORAL_SOLUTION | Freq: Four times a day (QID) | ORAL | 0 refills | Status: DC | PRN

## 2023-07-01 NOTE — Telephone Encounter (Signed)
I do not see listed pharmacy. Please always update pharmacy before sending. I have updated and sent in for her

## 2023-07-02 NOTE — Telephone Encounter (Signed)
Spoke with patient, confirmed she did receive her cough medication. Nothing further needed.

## 2023-07-08 ENCOUNTER — Encounter: Payer: Self-pay | Admitting: Family Medicine

## 2023-07-08 ENCOUNTER — Ambulatory Visit (INDEPENDENT_AMBULATORY_CARE_PROVIDER_SITE_OTHER): Payer: Medicare Other | Admitting: Family Medicine

## 2023-07-08 ENCOUNTER — Ambulatory Visit (INDEPENDENT_AMBULATORY_CARE_PROVIDER_SITE_OTHER): Payer: Medicare Other

## 2023-07-08 VITALS — BP 118/78 | HR 87 | Temp 97.7°F | Ht 63.25 in | Wt 127.4 lb

## 2023-07-08 DIAGNOSIS — R051 Acute cough: Secondary | ICD-10-CM | POA: Diagnosis not present

## 2023-07-08 DIAGNOSIS — R5383 Other fatigue: Secondary | ICD-10-CM | POA: Diagnosis not present

## 2023-07-08 DIAGNOSIS — R059 Cough, unspecified: Secondary | ICD-10-CM | POA: Diagnosis not present

## 2023-07-08 MED ORDER — PROMETHAZINE-DM 6.25-15 MG/5ML PO SYRP
5.0000 mL | ORAL_SOLUTION | Freq: Four times a day (QID) | ORAL | 0 refills | Status: DC | PRN
Start: 2023-07-08 — End: 2023-12-29

## 2023-07-08 NOTE — Patient Instructions (Signed)
I have refilled your cough syrup. This medication can make you sleepy. Do not drive while taking this medication.  We are getting an xray today. We will be in contact with any abnormal results that require further attention.  The cough from this illness can last up to a month.   If there is anything on your chest xray, we will send in antibiotics and will call to let you know.

## 2023-07-08 NOTE — Progress Notes (Signed)
   Acute Office Visit  Subjective:     Patient ID: Rita Harding, female    DOB: 1933/10/27, 89 y.o.   MRN: [id number]  Chief Complaint  Patient presents with   Acute Visit    Mucus in nose and throat, symptoms ongoing for 2 weeks. Cough deep in chest, wheezing    HPI Patient is in today for evaluation of cough, increased fatigue, for the last 2 weeks. Has tried promethazine cough syrup with some relief at night. Was seen in this office a week ago for viral URI. States that the fatigue has increased, and the cough is still there.   Denies abdominal pain, nausea, vomiting, diarrhea, rash, fever, chills, other symptoms.  Medical hx as outlined below.  ROS Per HPI      Objective:    BP 118/78 (BP Location: Left Arm, Patient Position: Sitting, Cuff Size: Small)   Pulse 87   Temp 97.7 F (36.5 C) (Oral)   Ht 5' 3.25" (1.607 m)   Wt 127 lb 6.4 oz (57.8 kg)   SpO2 99%   BMI 22.39 kg/m    Physical Exam Vitals and nursing note reviewed.  Constitutional:      General: She is not in acute distress. HENT:     Head: Normocephalic and atraumatic.     Right Ear: Tympanic membrane and ear canal normal.     Left Ear: Tympanic membrane and ear canal normal.     Nose: Congestion present.     Mouth/Throat:     Mouth: Mucous membranes are moist.     Pharynx: Oropharynx is clear. No oropharyngeal exudate or posterior oropharyngeal erythema.     Comments: Oropharyngeal cobblestoning     Eyes:     Extraocular Movements: Extraocular movements intact.  Cardiovascular:     Rate and Rhythm: Normal rate and regular rhythm.     Heart sounds: Normal heart sounds.  Pulmonary:     Effort: Pulmonary effort is normal. No respiratory distress.     Breath sounds: No wheezing, rhonchi or rales.     Comments: Dry cough Musculoskeletal:     Cervical back: Normal range of motion and neck supple.  Lymphadenopathy:     Cervical: No cervical adenopathy.  Skin:    General: Skin is warm and  dry.  Neurological:     Mental Status: She is alert.    No results found for any visits on 07/08/23.      Assessment & Plan:  1. Acute cough (Primary)  - DG Chest 2 View; Future - promethazine-dextromethorphan (PROMETHAZINE-DM) 6.25-15 MG/5ML syrup; Take 5 mLs by mouth 4 (four) times daily as needed.  Dispense: 118 mL; Refill: 0 - Chest xray is negative today  Meds ordered this encounter  Medications   promethazine-dextromethorphan (PROMETHAZINE-DM) 6.25-15 MG/5ML syrup    Sig: Take 5 mLs by mouth 4 (four) times daily as needed.    Dispense:  118 mL    Refill:  0    Return if symptoms worsen or fail to improve.  Moshe Cipro, FNP

## 2023-07-21 NOTE — Telephone Encounter (Signed)
 Copied from CRM [id number]. Topic: General - Other >> Jul 21, 2023 10:45 AM Allyne Areola wrote: Reason for CRM: Patient Is returning a call she received from Vernard Goldberg, please call back at earliest convenience

## 2023-07-22 NOTE — Telephone Encounter (Signed)
Called and left voicemail for patient to call back.

## 2023-07-23 NOTE — Telephone Encounter (Signed)
Called and spoke with patient. Informed her of past x-ray result. Patient expressed understanding and stated that all symptoms have cleared up except for hoarseness when speaking. Patient requesting advise on working with current issues.

## 2023-08-18 ENCOUNTER — Ambulatory Visit (INDEPENDENT_AMBULATORY_CARE_PROVIDER_SITE_OTHER): Payer: Medicare Other

## 2023-08-18 DIAGNOSIS — I441 Atrioventricular block, second degree: Secondary | ICD-10-CM | POA: Diagnosis not present

## 2023-08-19 LAB — CUP PACEART REMOTE DEVICE CHECK
Battery Remaining Longevity: 132 mo
Battery Remaining Percentage: 100 %
Brady Statistic RA Percent Paced: 19 %
Brady Statistic RV Percent Paced: 36 %
Date Time Interrogation Session: 20250310050100
Implantable Lead Connection Status: 753985
Implantable Lead Connection Status: 753985
Implantable Lead Connection Status: 753985
Implantable Lead Implant Date: 20230607
Implantable Lead Implant Date: 20230607
Implantable Lead Implant Date: 20230607
Implantable Lead Location: 753858
Implantable Lead Location: 753859
Implantable Lead Location: 753860
Implantable Lead Model: 7840
Implantable Lead Model: 7841
Implantable Lead Model: 7842
Implantable Lead Serial Number: 1061320
Implantable Lead Serial Number: 1169091
Implantable Lead Serial Number: 1287909
Implantable Pulse Generator Implant Date: 20230607
Lead Channel Impedance Value: 459 Ohm
Lead Channel Impedance Value: 583 Ohm
Lead Channel Pacing Threshold Amplitude: 0.6 V
Lead Channel Pacing Threshold Amplitude: 1.3 V
Lead Channel Pacing Threshold Pulse Width: 0.4 ms
Lead Channel Pacing Threshold Pulse Width: 0.4 ms
Lead Channel Setting Pacing Amplitude: 0.1 V
Lead Channel Setting Pacing Amplitude: 2 V
Lead Channel Setting Pacing Amplitude: 2.5 V
Lead Channel Setting Pacing Pulse Width: 0.1 ms
Lead Channel Setting Pacing Pulse Width: 0.4 ms
Lead Channel Setting Sensing Sensitivity: 2.5 mV
Lead Channel Setting Sensing Sensitivity: 2.5 mV
Pulse Gen Serial Number: 720955
Zone Setting Status: 755011

## 2023-10-06 NOTE — Addendum Note (Signed)
 Addended by: Edra Govern D on: 10/06/2023 10:25 AM   Modules accepted: Orders

## 2023-10-06 NOTE — Progress Notes (Signed)
 Remote pacemaker transmission.

## 2023-11-17 ENCOUNTER — Ambulatory Visit (INDEPENDENT_AMBULATORY_CARE_PROVIDER_SITE_OTHER): Payer: Medicare Other

## 2023-11-17 DIAGNOSIS — I441 Atrioventricular block, second degree: Secondary | ICD-10-CM | POA: Diagnosis not present

## 2023-11-18 LAB — CUP PACEART REMOTE DEVICE CHECK
Battery Remaining Longevity: 132 mo
Battery Remaining Percentage: 100 %
Brady Statistic RA Percent Paced: 18 %
Brady Statistic RV Percent Paced: 36 %
Date Time Interrogation Session: 20250609050100
Implantable Lead Connection Status: 753985
Implantable Lead Connection Status: 753985
Implantable Lead Connection Status: 753985
Implantable Lead Implant Date: 20230607
Implantable Lead Implant Date: 20230607
Implantable Lead Implant Date: 20230607
Implantable Lead Location: 753858
Implantable Lead Location: 753859
Implantable Lead Location: 753860
Implantable Lead Model: 7840
Implantable Lead Model: 7841
Implantable Lead Model: 7842
Implantable Lead Serial Number: 1061320
Implantable Lead Serial Number: 1169091
Implantable Lead Serial Number: 1287909
Implantable Pulse Generator Implant Date: 20230607
Lead Channel Impedance Value: 481 Ohm
Lead Channel Impedance Value: 622 Ohm
Lead Channel Pacing Threshold Amplitude: 0.7 V
Lead Channel Pacing Threshold Amplitude: 1.4 V
Lead Channel Pacing Threshold Pulse Width: 0.4 ms
Lead Channel Pacing Threshold Pulse Width: 0.4 ms
Lead Channel Setting Pacing Amplitude: 0.1 V
Lead Channel Setting Pacing Amplitude: 2 V
Lead Channel Setting Pacing Amplitude: 2.5 V
Lead Channel Setting Pacing Pulse Width: 0.1 ms
Lead Channel Setting Pacing Pulse Width: 0.4 ms
Lead Channel Setting Sensing Sensitivity: 2.5 mV
Lead Channel Setting Sensing Sensitivity: 2.5 mV
Pulse Gen Serial Number: 720955
Zone Setting Status: 755011

## 2023-11-24 ENCOUNTER — Ambulatory Visit: Payer: Self-pay | Admitting: Cardiology

## 2023-12-05 DIAGNOSIS — Z6822 Body mass index (BMI) 22.0-22.9, adult: Secondary | ICD-10-CM | POA: Diagnosis not present

## 2023-12-05 DIAGNOSIS — L989 Disorder of the skin and subcutaneous tissue, unspecified: Secondary | ICD-10-CM | POA: Diagnosis not present

## 2023-12-29 ENCOUNTER — Encounter: Payer: Self-pay | Admitting: Internal Medicine

## 2023-12-29 ENCOUNTER — Ambulatory Visit (INDEPENDENT_AMBULATORY_CARE_PROVIDER_SITE_OTHER): Admitting: Internal Medicine

## 2023-12-29 VITALS — BP 168/84 | HR 73 | Temp 98.1°F | Ht 63.25 in | Wt 128.0 lb

## 2023-12-29 DIAGNOSIS — I350 Nonrheumatic aortic (valve) stenosis: Secondary | ICD-10-CM

## 2023-12-29 DIAGNOSIS — E519 Thiamine deficiency, unspecified: Secondary | ICD-10-CM | POA: Diagnosis not present

## 2023-12-29 DIAGNOSIS — E785 Hyperlipidemia, unspecified: Secondary | ICD-10-CM

## 2023-12-29 DIAGNOSIS — E538 Deficiency of other specified B group vitamins: Secondary | ICD-10-CM | POA: Diagnosis not present

## 2023-12-29 DIAGNOSIS — D538 Other specified nutritional anemias: Secondary | ICD-10-CM | POA: Diagnosis not present

## 2023-12-29 DIAGNOSIS — D510 Vitamin B12 deficiency anemia due to intrinsic factor deficiency: Secondary | ICD-10-CM

## 2023-12-29 DIAGNOSIS — K5904 Chronic idiopathic constipation: Secondary | ICD-10-CM

## 2023-12-29 DIAGNOSIS — Z0000 Encounter for general adult medical examination without abnormal findings: Secondary | ICD-10-CM

## 2023-12-29 DIAGNOSIS — I10 Essential (primary) hypertension: Secondary | ICD-10-CM | POA: Diagnosis not present

## 2023-12-29 DIAGNOSIS — Z0001 Encounter for general adult medical examination with abnormal findings: Secondary | ICD-10-CM

## 2023-12-29 DIAGNOSIS — I2510 Atherosclerotic heart disease of native coronary artery without angina pectoris: Secondary | ICD-10-CM

## 2023-12-29 LAB — LIPID PANEL
Cholesterol: 227 mg/dL — ABNORMAL HIGH (ref 0–200)
HDL: 72 mg/dL (ref >39.00)
LDL Cholesterol: 111 mg/dL — ABNORMAL HIGH (ref 0–99)
NonHDL: 154.54
Total CHOL/HDL Ratio: 3
Triglycerides: 217 mg/dL — ABNORMAL HIGH (ref 0.0–149.0)
VLDL: 43.4 mg/dL — ABNORMAL HIGH (ref 0.0–40.0)

## 2023-12-29 LAB — BASIC METABOLIC PANEL WITH GFR
BUN: 19 mg/dL (ref 6–23)
CO2: 28 meq/L (ref 19–32)
Calcium: 9.6 mg/dL (ref 8.4–10.5)
Chloride: 103 meq/L (ref 96–112)
Creatinine, Ser: 0.69 mg/dL (ref 0.40–1.20)
GFR: 77.61 mL/min (ref >60.00)
Glucose, Bld: 99 mg/dL (ref 70–99)
Potassium: 4.2 meq/L (ref 3.5–5.1)
Sodium: 138 meq/L (ref 135–145)

## 2023-12-29 LAB — HEPATIC FUNCTION PANEL
ALT: 13 U/L (ref 0–35)
AST: 18 U/L (ref 0–37)
Albumin: 4.3 g/dL (ref 3.5–5.2)
Alkaline Phosphatase: 66 U/L (ref 39–117)
Bilirubin, Direct: 0.1 mg/dL (ref 0.0–0.3)
Total Bilirubin: 0.4 mg/dL (ref 0.2–1.2)
Total Protein: 7.3 g/dL (ref 6.0–8.3)

## 2023-12-29 LAB — FOLATE: Folate: 10.8 ng/mL (ref >5.9)

## 2023-12-29 LAB — TSH: TSH: 3.5 u[IU]/mL (ref 0.35–5.50)

## 2023-12-29 LAB — VITAMIN B12: Vitamin B-12: 279 pg/mL (ref 211–911)

## 2023-12-29 MED ORDER — CARVEDILOL 6.25 MG PO TABS: 6.2500 mg | ORAL_TABLET | Freq: Two times a day (BID) | ORAL | 1 refills | Status: AC

## 2023-12-29 MED ORDER — BENAZEPRIL HCL 10 MG PO TABS: 10.0000 mg | ORAL_TABLET | Freq: Every day | ORAL | 0 refills | Status: AC

## 2023-12-29 MED ORDER — ROSUVASTATIN CALCIUM 20 MG PO TABS
20.0000 mg | ORAL_TABLET | Freq: Every day | ORAL | 1 refills | Status: AC
Start: 2023-12-29 — End: 2024-04-29

## 2023-12-29 MED ORDER — INDAPAMIDE 1.25 MG PO TABS: 1.2500 mg | ORAL_TABLET | Freq: Every day | ORAL | 0 refills | Status: DC

## 2023-12-29 NOTE — Patient Instructions (Signed)

## 2023-12-29 NOTE — Progress Notes (Signed)
 "  Subjective:  Patient ID: Rita Harding, female    DOB: Oct 17, 1933  Age: 89 y.o. MRN: [id number]  CC: Memory issues , Annual Exam, Hypertension, Hyperlipidemia, and Coronary Artery Disease   HPI Rita Harding presents for a CPX and f/up ----  Discussed the use of AI scribe software for clinical note transcription with the patient, who gave verbal consent to proceed.  History of Present Illness Rita Harding is an 89 year old female who presents for medication review and management.  Her family is concerned that she may have stopped taking her medicine at times, and they would like a comprehensive list of her medications, their purposes, and the recommended dosages and frequencies. She seeks a comprehensive list of her medications, their purposes, and the recommended dosages and frequencies.  She experiences forgetfulness, such as forgetting everyday tasks like grocery items. Writing things down helps manage this forgetfulness. She is concerned about the possibility of developing Alzheimer's disease but acknowledges that some forgetfulness may be age-related.  She feels more fatigued than usual, attributing this to her age. She is not as active as she would like, despite having a new dog that she feels she should walk more often. Rita chest pain, shortness of breath, dizziness, or lightheadedness during physical activity. Her sleep is good, with a routine bedtime around 8 or 9 PM.  She recently had a sore on her leg, described as a 'big patch,' for which she was prescribed antibiotics and an ointment by a [facility]. The treatment has been effective, and the sore has improved.    Outpatient Medications Prior to Harding  Medication Sig Dispense Refill   aspirin  EC 81 MG tablet Take 81 mg by mouth daily. (Patient not taking: Reported on 12/29/2023)     lubiprostone  (AMITIZA ) 24 MCG capsule Take 1 capsule (24 mcg total) by mouth 2 (two) times daily with a meal. (Patient not  taking: Reported on 12/29/2023) 180 capsule 1   benazepril  (LOTENSIN ) 5 MG tablet Take 1 tablet (5 mg total) by mouth daily. (Patient not taking: Reported on 12/29/2023) 45 tablet 3   carvedilol  (COREG ) 6.25 MG tablet Take 1 tablet (6.25 mg total) by mouth 2 (two) times daily with a meal. (Patient not taking: Reported on 12/29/2023) 180 tablet 1   promethazine -dextromethorphan (PROMETHAZINE -DM) 6.25-15 MG/5ML syrup Take 5 mLs by mouth 4 (four) times daily as needed. (Patient not taking: Reported on 12/29/2023) 118 mL 0   rosuvastatin  (CRESTOR ) 20 MG tablet Take 1 tablet (20 mg total) by mouth daily. (Patient not taking: Reported on 12/29/2023) 90 tablet 1   Rita Harding.    ROS Review of Systems  Constitutional: Negative.  Negative for appetite change, chills, diaphoresis and fatigue.  HENT: Negative.    Respiratory: Negative.  Negative for cough, chest tightness, shortness of breath and wheezing.   Cardiovascular:  Negative for chest pain, palpitations and leg swelling.  Gastrointestinal: Negative.  Negative for abdominal pain, constipation, diarrhea, nausea and vomiting.  Genitourinary: Negative.  Negative for difficulty urinating.  Musculoskeletal: Negative.  Negative for arthralgias and myalgias.  Skin: Negative.   Neurological:  Negative for dizziness and weakness.  Hematological:  Negative for adenopathy. Does not bruise/bleed easily.  Psychiatric/Behavioral:  Positive for confusion and decreased concentration. Negative for behavioral problems and self-injury. The patient is not nervous/anxious and is not hyperactive.     Objective:  BP (!) 168/84 (BP Location: Left Arm, Patient Position: Sitting, Cuff Size: Normal)  Pulse 73   Temp 98.1 F (36.7 C) (Oral)   Ht 5' 3.25 (1.607 m)   Wt 128 lb (58.1 kg)   SpO2 96%   BMI 22.50 kg/m   BP Readings from Last 3 Encounters:  12/29/23 (!) 168/84  07/08/23 118/78  06/30/23 128/80    Wt Readings  from Last 3 Encounters:  12/29/23 128 lb (58.1 kg)  07/08/23 127 lb 6.4 oz (57.8 kg)  06/30/23 129 lb (58.5 kg)    Physical Exam Vitals reviewed.  Constitutional:      General: She is not in acute distress.    Appearance: She is not toxic-appearing or diaphoretic.  HENT:     Nose: Nose normal.     Mouth/Throat:     Mouth: Mucous membranes are moist.  Eyes:     General: Rita scleral icterus.    Conjunctiva/sclera: Conjunctivae normal.  Cardiovascular:     Rate and Rhythm: Normal rate and regular rhythm.     Heart sounds: Murmur heard.     Systolic murmur is present with a grade of 1/6.     Rita diastolic murmur is present.     Rita friction rub. Rita gallop.  Pulmonary:     Effort: Pulmonary effort is normal.     Breath sounds: Rita stridor. Rita wheezing, rhonchi or rales.  Abdominal:     General: Abdomen is flat.     Palpations: There is Rita mass.     Tenderness: There is Rita abdominal tenderness. There is Rita guarding.     Hernia: Rita hernia is present.  Musculoskeletal:        General: Normal range of motion.     Cervical back: Neck supple.     Right lower leg: Rita edema.     Left lower leg: Rita edema.  Lymphadenopathy:     Cervical: Rita cervical adenopathy.  Skin:    General: Skin is warm and dry.  Neurological:     General: Rita focal deficit present.     Mental Status: She is alert.  Psychiatric:        Attention and Perception: She is inattentive.        Mood and Affect: Mood is anxious. Mood is not depressed.        Speech: Speech is tangential.        Behavior: Behavior normal.        Thought Content: Thought content normal. Thought content is not paranoid or delusional. Thought content does not include homicidal or suicidal ideation.        Cognition and Memory: Cognition is impaired. Memory is impaired.     Lab Results  Component Value Date   WBC 3.8 (L) 01/09/2023   HGB 13.0 01/09/2023   HCT 39.6 01/09/2023   PLT 158.0 01/09/2023   GLUCOSE 99 12/29/2023   CHOL 227  (H) 12/29/2023   TRIG 217.0 (H) 12/29/2023   HDL 72.00 12/29/2023   LDLCALC 111 (H) 12/29/2023   ALT 13 12/29/2023   AST 18 12/29/2023   NA 138 12/29/2023   K 4.2 12/29/2023   CL 103 12/29/2023   CREATININE 0.69 12/29/2023   BUN 19 12/29/2023   CO2 28 12/29/2023   TSH 3.50 12/29/2023   INR 1.34 05/27/2015   HGBA1C 5.8 (H) 11/13/2021    MM 3D SCREENING MAMMOGRAM BILATERAL BREAST Result Date: 02/17/2023 CLINICAL DATA:  Screening. EXAM: DIGITAL SCREENING BILATERAL MAMMOGRAM WITH TOMOSYNTHESIS AND CAD TECHNIQUE: Bilateral screening digital craniocaudal and mediolateral oblique mammograms were obtained.  Bilateral screening digital breast tomosynthesis was performed. The images were evaluated with computer-aided detection. COMPARISON:  None available. ACR Breast Density Category b: There are scattered areas of fibroglandular density. FINDINGS: There are Rita findings suspicious for malignancy. IMPRESSION: Rita mammographic evidence of malignancy. A result letter of this screening mammogram will be mailed directly to the patient. RECOMMENDATION: Screening mammogram in one year. (Code:SM-B-01Y) BI-RADS CATEGORY  1: Negative. Electronically Signed   By: Dina  Arceo M.D.   On: 02/17/2023 13:58    Assessment & Plan:  Vitamin B12 deficiency anemia due to intrinsic factor deficiency -     Folate; Future -     Vitamin B12; Future -     Methylmalonic acid, serum; Future  Chronic idiopathic constipation -     TSH; Future  Senile calcific aortic valve sclerosis -     Ambulatory referral to Cardiology  Dietary folate deficiency -     Folate; Future -     Vitamin B12; Future -     Methylmalonic acid, serum; Future  Essential hypertension -     TSH; Future -     Basic metabolic panel with GFR; Future -     Benazepril  HCl; Take 1 tablet (10 mg total) by mouth daily.  Dispense: 90 tablet; Refill: 0  Encounter for general adult medical examination with abnormal findings- Exam completed, labs reviewed,  vaccines reviewed, Rita cancer screenings indicated, pt ed material was given.   Coronary artery disease, non-occlusive -     Rosuvastatin  Calcium ; Take 1 tablet (20 mg total) by mouth daily.  Dispense: 90 tablet; Refill: 1  Hyperlipidemia with target LDL less than 100 -     Rosuvastatin  Calcium ; Take 1 tablet (20 mg total) by mouth daily.  Dispense: 90 tablet; Refill: 1 -     TSH; Future -     Hepatic function panel; Future -     Lipid panel; Future -     AMB Referral VBCI Care Management  Primary hypertension- She has not achieved her BP goal. Will restart the ACEI and BB. -     Carvedilol ; Take 1 tablet (6.25 mg total) by mouth 2 (two) times daily with a meal.  Dispense: 180 tablet; Refill: 1 -     TSH; Future -     AMB Referral VBCI Care Management -     Indapamide ; Take 1 tablet (1.25 mg total) by mouth daily.  Dispense: 90 tablet; Refill: 0  Coronary artery disease involving native coronary artery of native heart without angina pectoris -     Carvedilol ; Take 1 tablet (6.25 mg total) by mouth 2 (two) times daily with a meal.  Dispense: 180 tablet; Refill: 1 -     Ambulatory referral to Cardiology -     Benazepril  HCl; Take 1 tablet (10 mg total) by mouth daily.  Dispense: 90 tablet; Refill: 0  Anemia due to acquired thiamine  deficiency -     Vitamin B1; Future  Anemia due to zinc  deficiency -     Zinc ; Future     Follow-up: Return in about 6 months (around 06/30/2024).  Debby Molt, MD "

## 2023-12-31 NOTE — Addendum Note (Signed)
 Addended by: TAWNI DRILLING D on: 12/31/2023 05:35 PM   Modules accepted: Orders

## 2023-12-31 NOTE — Progress Notes (Signed)
 Remote pacemaker transmission.

## 2024-01-04 ENCOUNTER — Other Ambulatory Visit: Payer: Self-pay | Admitting: Internal Medicine

## 2024-01-04 ENCOUNTER — Ambulatory Visit: Payer: Self-pay | Admitting: Internal Medicine

## 2024-01-04 DIAGNOSIS — D538 Other specified nutritional anemias: Secondary | ICD-10-CM

## 2024-01-04 DIAGNOSIS — D510 Vitamin B12 deficiency anemia due to intrinsic factor deficiency: Secondary | ICD-10-CM

## 2024-01-04 LAB — METHYLMALONIC ACID, SERUM: Methylmalonic Acid, Quant: 436 nmol/L — ABNORMAL HIGH (ref 85–423)

## 2024-01-04 LAB — ZINC: Zinc: 54 ug/dL — ABNORMAL LOW (ref 60–130)

## 2024-01-04 LAB — VITAMIN B1: Vitamin B1 (Thiamine): 17 nmol/L (ref 8–30)

## 2024-01-04 MED ORDER — CYANOCOBALAMIN 2000 MCG PO TABS: 2000.0000 ug | ORAL_TABLET | Freq: Every day | ORAL | 1 refills | Status: AC

## 2024-01-04 MED ORDER — ZINC GLUCONATE 50 MG PO TABS: 50.0000 mg | ORAL_TABLET | Freq: Every day | ORAL | 1 refills | Status: AC

## 2024-01-05 ENCOUNTER — Telehealth: Payer: Self-pay | Admitting: Internal Medicine

## 2024-01-05 NOTE — Telephone Encounter (Signed)
 Pt daughter is asking if someone in clinical can give her a call because she received calls from Dr Joshua but missed them.. Please advise,  Thanks

## 2024-01-06 ENCOUNTER — Other Ambulatory Visit: Payer: Self-pay | Admitting: Internal Medicine

## 2024-01-06 NOTE — Telephone Encounter (Signed)
 Patient returned Jazz's call - Armin was busy - she will call her back

## 2024-01-06 NOTE — Telephone Encounter (Signed)
 Called to speak with [facility]. Unable to reach her. LMTRC

## 2024-01-07 ENCOUNTER — Telehealth: Payer: Self-pay | Admitting: *Deleted

## 2024-01-07 NOTE — Progress Notes (Unsigned)
 Care Guide Pharmacy Note  01/07/2024 Name: Rita Harding MRN: [id number] DOB: 03-12-1934  Referred By: Joshua Debby CROME, MD Reason for referral: Call Attempt #1 and Complex Care Management (Outreach to schedule referral with pharmacist )   Rita Harding is a 89 y.o. year old female who is a primary care patient of Joshua Debby CROME, MD.  Rita Harding was referred to the pharmacist for assistance related to: HTN  An unsuccessful telephone outreach was attempted today to contact the patient who was referred to the pharmacy team for assistance with medication management. Additional attempts will be made to contact the patient.  Thedford Franks, CMA [place]  [facility], [facility] Guide Direct Dial: [phone]  Fax: [phone] Website: [email or web address]

## 2024-01-07 NOTE — Telephone Encounter (Signed)
 Spoke with the patients daughter and advised her on all of her mother medication and she gave a verbal understanding.

## 2024-01-08 NOTE — Progress Notes (Signed)
 Care Guide Pharmacy Note  01/08/2024 Name: [name] MRN: [id number] DOB: [birth year]  Referred By: Joshua Debby CROME, MD Reason for referral: Call Attempt #1 and Complex Care Management (Outreach to schedule referral with pharmacist )   Rita Harding is a 89 y.o. year old female who is a primary care patient of Joshua Debby CROME, MD.  [name] was referred to the pharmacist for assistance related to: HTN  A second unsuccessful telephone outreach was attempted today to contact the patient who was referred to the pharmacy team for assistance with medication management. Additional attempts will be made to contact the patient.  Thedford Franks, CMA [place]  [facility], [facility] Guide Direct Dial: [phone]  Fax: [phone] Website: [email or web address]

## 2024-01-08 NOTE — Progress Notes (Signed)
 Care Guide Pharmacy Note  01/08/2024 Name: Rita Harding MRN: [id number] DOB: [birth year]  Referred By: Joshua Debby CROME, MD Reason for referral: Call Attempt #1 and Complex Care Management (Outreach to schedule referral with pharmacist )   Rita Harding is a 89 y.o. year old female who is a primary care patient of Joshua Debby CROME, MD.  [name] was referred to the pharmacist for assistance related to: HTN  Successful contact was made with the patient to discuss pharmacy services including being ready for the pharmacist to call at least 5 minutes before the scheduled appointment time and to have medication bottles and any blood pressure readings ready for review. The patient agreed to meet with the pharmacist via telephone visit on 01/23/2024  Thedford Franks, CMA [place]  [facility], [facility] Guide Direct Dial: [phone]  Fax: [phone] Website: [email or web address]

## 2024-01-23 ENCOUNTER — Other Ambulatory Visit: Admitting: Pharmacist

## 2024-01-23 DIAGNOSIS — I10 Essential (primary) hypertension: Secondary | ICD-10-CM

## 2024-01-23 NOTE — Patient Instructions (Signed)
 It was a pleasure speaking with you today!  Continue all medications. Get a blood pressure cuff and check blood pressure at home to ensure it is not running too high or too low.  Feel free to call with any questions or concerns!  Darrelyn Drum, PharmD, BCPS, CPP Clinical Pharmacist Practitioner [facility] at [facility] [phone]

## 2024-01-23 NOTE — Progress Notes (Signed)
 01/23/2024 Name: Rita Harding MRN: [id number] DOB: 10-12-1933  Chief Complaint  Patient presents with   Hypertension   Medication Management    Rita Harding is a 89 y.o. year old female who presented for a telephone visit.   They were referred to the pharmacist by their PCP for assistance in managing hypertension.    Subjective:  Care Team: Primary Care Provider: Joshua Debby CROME, MD ; Next Scheduled Visit: not scheduled   Medication Access/Adherence  Current Pharmacy:  [facility] [address] [place] - [address] [place] [address] Phone: [phone] Fax: [phone]  [id number] [place], [place] - [address] [place] [address] Phone: [phone] Fax: [phone]   Patient reports affordability concerns with their medications: No  Patient reports access/transportation concerns to their pharmacy: No  Patient reports adherence concerns with their medications:  Yes    It appears her daughter voiced adherence concerns at last PCP f/u. Pt had not filled benazepril or carvedilol since 01/2023  Hypertension: *Pt could not confirm names of medications. Her granddaughter put them in a pill box for her. She noted 7 pills per day. Current medications: benazepril 10 mg daily, carvedilol 6.25 mg twice daily, indapamide 1.25 mg daily   Patient does not have a validated, automated, upper arm home BP cuff Current blood pressure readings: n/a  Patient denies hypotensive s/sx including dizziness, lightheadedness.     Objective:  Lab Results  Component Value Date   HGBA1C 5.8 (H) 11/13/2021    Lab Results  Component Value Date   CREATININE 0.69 12/29/2023   BUN 19 12/29/2023   NA 138 12/29/2023   K 4.2 12/29/2023   CL 103 12/29/2023   CO2 28 12/29/2023    Lab Results  Component Value Date   CHOL 227 (H) 12/29/2023   HDL 72.00 12/29/2023   LDLCALC 111 (H) 12/29/2023    TRIG 217.0 (H) 12/29/2023   CHOLHDL 3 12/29/2023    Medications Reviewed Today     Reviewed by Merceda Lela SAUNDERS, RPH (Pharmacist) on 01/23/24 at 0930  Med List Status: <None>   Medication Order Taking? Sig Documenting Provider Last Dose Status Informant  aspirin EC 81 MG tablet [id number]  Take 81 mg by mouth daily.  Patient not taking: Reported on 12/29/2023   [provider]  Active Self  benazepril (LOTENSIN) 10 MG tablet [id number]  Take 1 tablet (10 mg total) by mouth daily. Joshua Debby CROME, MD  Active   carvedilol (COREG) 6.25 MG tablet [id number] Yes Take 1 tablet (6.25 mg total) by mouth 2 (two) times daily with a meal. Joshua Debby CROME, MD  Active   cyanocobalamin 2000 MCG tablet [id number]  Take 1 tablet (2,000 mcg total) by mouth daily. Joshua Debby CROME, MD  Active   indapamide (LOZOL) 1.25 MG tablet [id number]  Take 1 tablet (1.25 mg total) by mouth daily. Joshua Debby CROME, MD  Active   lubiprostone [name]) 24 MCG capsule [id number]  Take 1 capsule (24 mcg total) by mouth 2 (two) times daily with a meal.  Patient not taking: Reported on 12/29/2023   Joshua Debby CROME, MD  Active   rosuvastatin (CRESTOR) 20 MG tablet [id number]  Take 1 tablet (20 mg total) by mouth daily. Joshua Debby CROME, MD  Active   zinc gluconate 50 MG tablet [id number]  Take 1 tablet (50 mg total) by mouth daily. Joshua Debby CROME, MD  Active  Assessment/Plan:   Hypertension: - Currently uncontrolled, BP goal <130/80. Suspect pt's BP was elevated due to not taking BP medication prior to appt. - Reviewed appropriate blood pressure monitoring technique and reviewed goal blood pressure. Recommended to check home blood pressure and heart rate and contact office if having elevated numbers. - Recommend to continue current regimen.     Follow Up Plan: PRN  Darrelyn Drum, PharmD, BCPS, CPP Clinical Pharmacist Practitioner [place] [facility] at [facility] [phone]

## 2024-02-16 ENCOUNTER — Ambulatory Visit (INDEPENDENT_AMBULATORY_CARE_PROVIDER_SITE_OTHER): Payer: Medicare Other

## 2024-02-16 DIAGNOSIS — I441 Atrioventricular block, second degree: Secondary | ICD-10-CM | POA: Diagnosis not present

## 2024-02-17 ENCOUNTER — Ambulatory Visit: Payer: Self-pay | Admitting: Cardiology

## 2024-02-17 LAB — CUP PACEART REMOTE DEVICE CHECK
Battery Remaining Longevity: 132 mo
Battery Remaining Percentage: 100 %
Brady Statistic RA Percent Paced: 18 %
Brady Statistic RV Percent Paced: 36 %
Date Time Interrogation Session: 20250908050100
Implantable Lead Connection Status: 753985
Implantable Lead Connection Status: 753985
Implantable Lead Connection Status: 753985
Implantable Lead Implant Date: 20230607
Implantable Lead Implant Date: 20230607
Implantable Lead Implant Date: 20230607
Implantable Lead Location: 753858
Implantable Lead Location: 753859
Implantable Lead Location: 753860
Implantable Lead Model: 7840
Implantable Lead Model: 7841
Implantable Lead Model: 7842
Implantable Lead Serial Number: 1061320
Implantable Lead Serial Number: 1169091
Implantable Lead Serial Number: 1287909
Implantable Pulse Generator Implant Date: 20230607
Lead Channel Impedance Value: 540 Ohm
Lead Channel Impedance Value: 613 Ohm
Lead Channel Pacing Threshold Amplitude: 0.7 V
Lead Channel Pacing Threshold Amplitude: 1.2 V
Lead Channel Pacing Threshold Pulse Width: 0.4 ms
Lead Channel Pacing Threshold Pulse Width: 0.4 ms
Lead Channel Setting Pacing Amplitude: 0.1 V
Lead Channel Setting Pacing Amplitude: 2 V
Lead Channel Setting Pacing Amplitude: 2.5 V
Lead Channel Setting Pacing Pulse Width: 0.1 ms
Lead Channel Setting Pacing Pulse Width: 0.4 ms
Lead Channel Setting Sensing Sensitivity: 2.5 mV
Lead Channel Setting Sensing Sensitivity: 2.5 mV
Pulse Gen Serial Number: 720955
Zone Setting Status: 755011

## 2024-02-26 NOTE — Progress Notes (Signed)
 Remote PPM Transmission

## 2024-04-29 ENCOUNTER — Ambulatory Visit (INDEPENDENT_AMBULATORY_CARE_PROVIDER_SITE_OTHER)

## 2024-04-29 VITALS — Ht 63.0 in | Wt 128.0 lb

## 2024-04-29 DIAGNOSIS — Z0000 Encounter for general adult medical examination without abnormal findings: Secondary | ICD-10-CM | POA: Diagnosis not present

## 2024-04-29 NOTE — Patient Instructions (Addendum)
 Rita Harding,  Thank you for taking the time for your Medicare Wellness Visit. I appreciate your continued commitment to your health goals. Please review the care plan we discussed, and feel free to reach out if I can assist you further.  Please note that Annual Wellness Visits do not include a physical exam. Some assessments may be limited, especially if the visit was conducted virtually. If needed, we may recommend an in-person follow-up with your provider.  Ongoing Care Seeing your primary care provider every 3 to 6 months helps us  monitor your health and provide consistent, personalized care.   Referrals If a referral was made during today's visit and you haven't received any updates within two weeks, please contact the referred provider directly to check on the status.  Recommended Screenings:  Health Maintenance  Topic Date Due   Osteoporosis screening with Bone Density Scan  Never done   Zoster (Shingles) Vaccine (2 of 2) 07/24/2022   Flu Shot  01/09/2024   COVID-19 Vaccine (1 - 2025-26 season) 02/09/2024   Medicare Annual Wellness Visit  04/29/2025   DTaP/Tdap/Td vaccine (2 - Td or Tdap) 08/20/2028   Pneumococcal Vaccine for age over 21  Completed   Meningitis B Vaccine  Aged Out       04/29/2024    3:15 PM  Advanced Directives  Does Patient Have a Medical Advance Directive? Yes  Type of Estate Agent of [place];Living will  Does patient want to make changes to medical advance directive? Yes (Inpatient - patient requests chaplain consult to change a medical advance directive)  Copy of Healthcare Power of Attorney in Chart? No - copy requested    Vision: Annual vision screenings are recommended for early detection of glaucoma, cataracts, and diabetic retinopathy. These exams can also reveal signs of chronic conditions such as diabetes and high blood pressure.  Dental: Annual dental screenings help detect early signs of oral cancer, gum disease, and  other conditions linked to overall health, including heart disease and diabetes.

## 2024-04-29 NOTE — Progress Notes (Signed)
 Chief Complaint  Patient presents with   Medicare Wellness     Subjective:   Rita Harding is a 89 y.o. female who presents for a Medicare Annual Wellness Visit.  I connected with  Rita Harding on 04/29/24 by a audio enabled telemedicine application and verified that I am speaking with the correct person using two identifiers.  Patient Location: Home  Provider Location: Office/Clinic  Persons Participating in Visit: Patient.  I discussed the limitations of evaluation and management by telemedicine. The patient expressed understanding and agreed to proceed.  Vital Signs: Because this visit was a virtual/telehealth visit, some criteria may be missing or patient reported. Any vitals not documented were not able to be obtained and vitals that have been documented are patient reported.   Allergies (verified) Codeine   History: Past Medical History:  Diagnosis Date   Coronary artery disease 2007   non-critcal coronary single disease   History of DVT (deep vein thrombosis)    LLE; BEEN OFF  COUMADIN SINCE Sept 2012   Hyperlipidemia    Hypertension    MI (myocardial infarction) (HCC) 2007   Thought to be takotsubo cardiomyopathy   Takotsubo cardiomyopathy 2007   Result with normal EF as OF 2013   Past Surgical History:  Procedure Laterality Date   AMPUTATION Left 08/21/2018   Procedure: REVISION AMPUTATION LEFT RING FINGER AND INCISION AND DRAINAGE LEFT LONG FINGER;  Surgeon: Murrell Drivers, MD;  Location: [facility];  Service: Orthopedics;  Laterality: Left;   APPENDECTOMY     CARDIAC CATHETERIZATION  01/03/2006    50-60% LAD with some myocardial  bridging; NON ISCHEMIC  CARDIOMYOPATHY  with EF  30 to 35% no evidence of infarct -- anteroapical and inferapical wall motin abnormalities compatilble with Takotsubo-type syndrome   DOPPLER LEFT LOWER EXTREMITY ([facility] HX) Left 01/29/2011   left saphenous vein -small amt of chroinc appearing calicification on  anterior wall that was nonobstructing,mildly abnormal    DOPPLER RIGHT ADDITIONAL EXTR ([facility] HX) Right 01/29/2011   FIBULA FRACTURE SURGERY Left 2009   UNDER CARE DR MARCEY NORRIS   FRACTURE SURGERY     NM MYOVIEW  LTD  02/21/2012   low risk  and normal EF 70% or greater   PACEMAKER IMPLANT N/A 11/14/2021   Procedure: PACEMAKER IMPLANT;  Surgeon: Inocencio Soyla Lunger, MD;  Location: [facility];  Service: Cardiovascular;  Laterality: N/A;   TONSILLECTOMY     TRANSTHORACIC ECHOCARDIOGRAM  07/31/2010   no MVP ,NORMAL SYSTOLIC FUNCTION (EF >55%). Mild Ao Sclerosis.     Family History  Problem Relation Age of Onset   Breast cancer Mother    Cancer Mother    Heart attack Father    Cancer Maternal Grandmother    Heart disease Paternal Grandmother    Heart disease Paternal Grandfather    Social History   Occupational History   Occupation: Retired  Tobacco Use   Smoking status: Former    Current packs/day: 0.00    Types: Cigarettes    Quit date: 06/10/1965    Years since quitting: 58.9   Smokeless tobacco: Never   Tobacco comments:    quit smoking in the 1960s  Vaping Use   Vaping status: Never Used  Substance and Sexual Activity   Alcohol use: Yes    Alcohol/week: 25.0 standard drinks of alcohol    Types: 25 Glasses of wine per week   Drug use: No   Sexual activity: Not Currently   Tobacco Counseling  Counseling given: Not Answered Tobacco comments: quit smoking in the 1960s  SDOH Screenings   Food Insecurity: No Food Insecurity (04/29/2024)  Housing: Unknown (04/29/2024)  Transportation Needs: No Transportation Needs (04/29/2024)  Utilities: Not At Risk (04/29/2024)  Alcohol Screen: Low Risk  (01/30/2023)  Depression (PHQ2-9): Low Risk  (04/29/2024)  Financial Resource Strain: Low Risk  (01/30/2023)  Physical Activity: Inactive (04/29/2024)  Social Connections: Moderately Isolated (04/29/2024)  Stress: No Stress Concern Present (04/29/2024)  Tobacco Use: Medium  Risk (04/29/2024)  Health Literacy: Adequate Health Literacy (04/29/2024)   See flowsheets for full screening details  Depression Screen PHQ 2 & 9 Depression Scale- Over the past 2 weeks, how often have you been bothered by any of the following problems? Little interest or pleasure in doing things: 0 Feeling down, depressed, or hopeless (PHQ Adolescent also includes...irritable): 0 PHQ-2 Total Score: 0 Trouble falling or staying asleep, or sleeping too much: 0 Feeling tired or having little energy: 0 Poor appetite or overeating (PHQ Adolescent also includes...weight loss): 0 Feeling bad about yourself - or that you are a failure or have let yourself or your family down: 0 Trouble concentrating on things, such as reading the newspaper or watching television (PHQ Adolescent also includes...like school work): 0 Moving or speaking so slowly that other people could have noticed. Or the opposite - being so fidgety or restless that you have been moving around a lot more than usual: 0 Thoughts that you would be better off dead, or of hurting yourself in some way: 0 PHQ-9 Total Score: 0  Depression Treatment Depression Interventions/Treatment : PHQ2-9 Score <4 Follow-up Not Indicated     Goals Addressed               This Visit's Progress     Patient Stated (pt-stated)        Patient stated she plans to continue to exercise more and watch diet       Visit info / Clinical Intake: Medicare Wellness Visit Type:: Subsequent Annual Wellness Visit Persons participating in visit:: patient Medicare Wellness Visit Mode:: Telephone If telephone:: video declined Because this visit was a virtual/telehealth visit:: vitals recorded from last visit If Telephone or Video please confirm:: I connected with the patient using audio enabled telemedicine application and verified that I am speaking with the correct person using two identifiers; I discussed the limitations of evaluation and management by  telemedicine; The patient expressed understanding and agreed to proceed Patient Location:: Home Provider Location:: Office Information given by:: patient Interpreter Needed?: No Pre-visit prep was completed: yes AWV questionnaire completed by patient prior to visit?: no Living arrangements:: with family/others (lives with Daughter and Grandchild) Patient's Overall Health Status Rating: good Typical amount of pain: none Does pain affect daily life?: no Are you currently prescribed opioids?: no  Dietary Habits and Nutritional Risks How many meals a day?: 2 (2-3x) Eats fruit and vegetables daily?: yes Most meals are obtained by: preparing own meals; having others provide food; eating out In the last 2 weeks, have you had any of the following?: none Diabetic:: no  Functional Status Activities of Daily Living (to include ambulation/medication): Independent Ambulation: Independent with device- listed below Home Assistive Devices/Equipment: Eyeglasses (wears eyeglasses for reading) Medication Administration: Dependent (Daughter/Grandchild helps) Is this a change from baseline?: Change from baseline, expected to last >3 days Is this a change from baseline?: Change from baseline, expected to last >3 days Home Management: Needs assistance (comment) (Daughter helps) Manage your own finances?: yes (Daughter also  helps sometimes) Primary transportation is: driving Concerns about vision?: no *vision screening is required for WTM* Concerns about hearing?: no  Fall Screening Falls in the past year?: 0 Number of falls in past year: 0 Was there an injury with Fall?: 0 Fall Risk Category Calculator: 0 Patient Fall Risk Level: Low Fall Risk  Fall Risk Patient at Risk for Falls Due to: No Fall Risks Fall risk Follow up: Falls evaluation completed; Falls prevention discussed  Home and Transportation Safety: All rugs have non-skid backing?: yes All stairs or steps have railings?: N/A, no  stairs Grab bars in the bathtub or shower?: yes Have non-skid surface in bathtub or shower?: yes Good home lighting?: yes Regular seat belt use?: yes Hospital stays in the last year:: no  Cognitive Assessment Difficulty concentrating, remembering, or making decisions? : no Will 6CIT or Mini Cog be Completed: yes What year is it?: 0 points What month is it?: 0 points Give patient an address phrase to remember (5 components): [address] [place], [place] About what time is it?: 0 points Count backwards from 20 to 1: 0 points Say the months of the year in reverse: 0 points Repeat the address phrase from earlier: 0 points 6 CIT Score: 0 points  Advance Directives (For Healthcare) Does Patient Have a Medical Advance Directive?: Yes Does patient want to make changes to medical advance directive?: Yes (Inpatient - patient requests chaplain consult to change a medical advance directive) Type of Advance Directive: Healthcare Power of [place]; Living will Copy of Healthcare Power of Attorney in Chart?: No - copy requested Copy of Living Will in Chart?: No - copy requested  Reviewed/Updated  Reviewed/Updated: Reviewed All (Medical, Surgical, Family, Medications, Allergies, Care Teams, Patient Goals)        Objective:    Today's Vitals   04/29/24 1513  Weight: 128 lb (58.1 kg)  Height: 5' 3 (1.6 m)   Body mass index is 22.67 kg/m.  Current Medications (verified) Outpatient Encounter Medications as of 04/29/2024  Medication Sig   aspirin  EC 81 MG tablet Take 81 mg by mouth daily.   benazepril  (LOTENSIN ) 10 MG tablet Take 1 tablet (10 mg total) by mouth daily.   carvedilol  (COREG ) 6.25 MG tablet Take 1 tablet (6.25 mg total) by mouth 2 (two) times daily with a meal.   cyanocobalamin  2000 MCG tablet Take 1 tablet (2,000 mcg total) by mouth daily.   indapamide  (LOZOL ) 1.25 MG tablet Take 1 tablet (1.25 mg total) by mouth daily.   lubiprostone  (AMITIZA ) 24 MCG capsule Take 1 capsule  (24 mcg total) by mouth 2 (two) times daily with a meal.   rosuvastatin  (CRESTOR ) 20 MG tablet Take 1 tablet (20 mg total) by mouth daily.   zinc  gluconate 50 MG tablet Take 1 tablet (50 mg total) by mouth daily.   No facility-administered encounter medications on file as of 04/29/2024.   Hearing/Vision screen Hearing Screening - Comments:: Denies hearing difficulties   Vision Screening - Comments:: Wears eyeglasses for reading - up to date with routine eye exams with [facility] Immunizations and Health Maintenance Health Maintenance  Topic Date Due   Bone Density Scan  Never done   Zoster Vaccines- Shingrix  (2 of 2) 07/24/2022   Influenza Vaccine  01/09/2024   COVID-19 Vaccine (1 - 2025-26 season) 02/09/2024   Medicare Annual Wellness (AWV)  04/29/2025   DTaP/Tdap/Td (2 - Td or Tdap) 08/20/2028   Pneumococcal Vaccine: 50+ Years  Completed   Meningococcal B Vaccine  Aged Out        Assessment/Plan:  This is a routine wellness examination for [place].  Patient Care Team: Joshua Debby CROME, MD as PCP - General (Internal Medicine) Anner Alm ORN, MD as PCP - Cardiology (Cardiology) Inocencio Soyla Lunger, MD as PCP - Electrophysiology (Cardiology) Leslee Reusing, MD as Consulting Physician (Ophthalmology)  I have personally reviewed and noted the following in the patient's chart:   Medical and social history Use of alcohol, tobacco or illicit drugs  Current medications and supplements including opioid prescriptions. Functional ability and status Nutritional status Physical activity Advanced directives List of other physicians Hospitalizations, surgeries, and ER visits in previous 12 months Vitals Screenings to include cognitive, depression, and falls Referrals and appointments  No orders of the defined types were placed in this encounter.  In addition, I have reviewed and discussed with patient certain preventive protocols, quality metrics, and best practice  recommendations. A written personalized care plan for preventive services as well as general preventive health recommendations were provided to patient.   Verdie CHRISTELLA Saba, CMA   04/29/2024   Return in 1 year (on 04/29/2025).  After Visit Summary: (MyChart) Due to this being a telephonic visit, the after visit summary with patients personalized plan was offered to patient via MyChart   Nurse Notes: Scheduled a 6-mth f/u appt w/PCP.

## 2024-05-17 ENCOUNTER — Ambulatory Visit: Payer: Medicare Other

## 2024-05-17 DIAGNOSIS — I441 Atrioventricular block, second degree: Secondary | ICD-10-CM | POA: Diagnosis not present

## 2024-05-18 LAB — CUP PACEART REMOTE DEVICE CHECK
Battery Remaining Longevity: 132 mo
Battery Remaining Percentage: 100 %
Brady Statistic RA Percent Paced: 18 %
Brady Statistic RV Percent Paced: 38 %
Date Time Interrogation Session: 20251208051500
Implantable Lead Connection Status: 753985
Implantable Lead Connection Status: 753985
Implantable Lead Connection Status: 753985
Implantable Lead Implant Date: 20230607
Implantable Lead Implant Date: 20230607
Implantable Lead Implant Date: 20230607
Implantable Lead Location: 753858
Implantable Lead Location: 753859
Implantable Lead Location: 753860
Implantable Lead Model: 7840
Implantable Lead Model: 7841
Implantable Lead Model: 7842
Implantable Lead Serial Number: 1061320
Implantable Lead Serial Number: 1169091
Implantable Lead Serial Number: 1287909
Implantable Pulse Generator Implant Date: 20230607
Lead Channel Impedance Value: 568 Ohm
Lead Channel Impedance Value: 615 Ohm
Lead Channel Pacing Threshold Amplitude: 0.7 V
Lead Channel Pacing Threshold Amplitude: 1 V
Lead Channel Pacing Threshold Pulse Width: 0.4 ms
Lead Channel Pacing Threshold Pulse Width: 0.4 ms
Lead Channel Setting Pacing Amplitude: 0.1 V
Lead Channel Setting Pacing Amplitude: 2 V
Lead Channel Setting Pacing Amplitude: 2.5 V
Lead Channel Setting Pacing Pulse Width: 0.1 ms
Lead Channel Setting Pacing Pulse Width: 0.4 ms
Lead Channel Setting Sensing Sensitivity: 2.5 mV
Lead Channel Setting Sensing Sensitivity: 2.5 mV
Pulse Gen Serial Number: 720955
Zone Setting Status: 755011

## 2024-05-19 NOTE — Progress Notes (Signed)
 Remote PPM Transmission

## 2024-05-20 ENCOUNTER — Ambulatory Visit: Payer: Self-pay | Admitting: Cardiology

## 2024-06-08 ENCOUNTER — Ambulatory Visit: Admitting: Internal Medicine

## 2024-06-12 ENCOUNTER — Other Ambulatory Visit: Payer: Self-pay | Admitting: Internal Medicine

## 2024-06-12 DIAGNOSIS — I10 Essential (primary) hypertension: Secondary | ICD-10-CM

## 2024-07-23 ENCOUNTER — Ambulatory Visit: Admitting: Pulmonary Disease

## 2025-06-09 ENCOUNTER — Encounter: Admitting: Internal Medicine

## 2025-06-09 ENCOUNTER — Ambulatory Visit
